# Patient Record
Sex: Female | Born: 1975
Health system: Southern US, Community
[De-identification: ages and names within clinical notes are randomized; demographics above are authoritative.]

## PROBLEM LIST (undated history)

## (undated) DIAGNOSIS — R011 Cardiac murmur, unspecified: Secondary | ICD-10-CM

## (undated) DIAGNOSIS — R55 Syncope and collapse: Secondary | ICD-10-CM

## (undated) DIAGNOSIS — K921 Melena: Secondary | ICD-10-CM

## (undated) DIAGNOSIS — I10 Essential (primary) hypertension: Secondary | ICD-10-CM

## (undated) HISTORY — DX: Cardiac murmur, unspecified: R01.1

## (undated) HISTORY — DX: Melena: K92.1

## (undated) HISTORY — DX: Essential (primary) hypertension: I10

## (undated) HISTORY — DX: Syncope and collapse: R55

---

## 2005-11-21 ENCOUNTER — Emergency Department: Payer: Self-pay | Admitting: Emergency Medicine

## 2009-10-19 ENCOUNTER — Encounter: Payer: Self-pay | Admitting: Family

## 2009-10-29 ENCOUNTER — Encounter: Payer: Self-pay | Admitting: Family

## 2010-08-07 ENCOUNTER — Emergency Department (HOSPITAL_BASED_OUTPATIENT_CLINIC_OR_DEPARTMENT_OTHER)
Admission: EM | Admit: 2010-08-07 | Discharge: 2010-08-08 | Disposition: A | Discharge status: Home / Self Care | Payer: Federal, State, Local not specified - PPO | Attending: Emergency Medicine | Admitting: Emergency Medicine

## 2010-08-07 DIAGNOSIS — I10 Essential (primary) hypertension: Secondary | ICD-10-CM | POA: Insufficient documentation

## 2010-08-07 LAB — BASIC METABOLIC PANEL
BUN: 14 mg/dL (ref 6–23)
CO2: 22 mEq/L (ref 19–32)
Glucose, Bld: 119 mg/dL — ABNORMAL HIGH (ref 70–99)
Potassium: 3.2 mEq/L — ABNORMAL LOW (ref 3.5–5.1)
Sodium: 141 mEq/L (ref 135–145)

## 2010-08-14 ENCOUNTER — Ambulatory Visit (INDEPENDENT_AMBULATORY_CARE_PROVIDER_SITE_OTHER): Payer: Federal, State, Local not specified - PPO | Admitting: Family

## 2010-08-14 ENCOUNTER — Encounter: Payer: Self-pay | Admitting: Family

## 2010-08-14 DIAGNOSIS — I10 Essential (primary) hypertension: Secondary | ICD-10-CM

## 2010-08-25 ENCOUNTER — Telehealth: Payer: Self-pay | Admitting: Family

## 2010-08-27 NOTE — Assessment & Plan Note (Signed)
Summary: seen in ED for elevated bp/bcbs/new to est/ss--rm 5   Vital Signs:  Patient profile:   35 year old female Menstrual status:  regular LMP:     08/11/2010 Height:      62 inches Weight:      146 pounds BMI:     26.80 Temp:     98.6 degrees F oral Pulse rate:   60 / minute Pulse rhythm:   regular Resp:     16 per minute BP sitting:   144 / 86  (right arm) Cuff size:   regular  Vitals Entered By: Mervin Kung CMA Duncan Dull) (August 14, 2010 1:53 PM) CC: Pt new to establish care. Follow up from ER for elevated BP, Hypertension Management Is Patient Diabetic? No Pain Assessment Patient in pain? no      LMP (date): 08/11/2010     Menstrual Status regular Enter LMP: 08/11/2010 Last PAP Result normal   CC:  Pt new to establish care. Follow up from ER for elevated BP and Hypertension Management.  History of Present Illness: Phyllis Hopkins is a 35 year old female who presents today to establish care.  Notes that she had an upper resp. infection 1 month ago and was told that her blood pressure was high. Reports that her BP was as high as 180/115.  Heart Murmur-  had ultrasound done in her 20's.  Has not had a PCP. Saw a cardiologist- forgot his name as well.  She was referred to her cardiologist by her GYN.  Sees Dr. Annett Fabian- [facility] in [place].  Blood in the stool-  pt reports that she had a colonoscopy performed in 2010 which showed "inflammation." Not sure where this was done.    Hypertension History:      She denies headache, chest pain, palpitations, dyspnea with exertion, peripheral edema, visual symptoms, syncope, and side effects from treatment.        Positive major cardiovascular risk factors include hypertension.  Negative major cardiovascular risk factors include female age less than 24 years old and non-tobacco-user status.     Preventive Screening-Counseling & Management  Alcohol-Tobacco     Alcohol drinks/day: <1     Smoking Status:  never  Caffeine-Diet-Exercise     Does Patient Exercise: yes     Times/week: 4  Allergies (verified): No Known Drug Allergies  Past History:  Past Medical History: Heart Murmur HTN Hx of blood in stool  Past Surgical History: none reported  Family History: Mother--living; HTN father--living, HTN, diabetes (diet controlled)  1 sister-- a&w no children  Social History: Reviewed history and no changes required. 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 Regular exercise-yes Smoking Status:  never Does Patient Exercise:  yes  Review of Systems       Constitutional: Denies Fever ENT:  Denies nasal congestion or sore throat. Resp: Denies cough CV:  Denies Chest Pain GI:  Denies nausea or vomitting GU: Denies dysuria Lymphatic: Denies lymphadenopathy Musculoskeletal:  some muscle pain left arm x 4 days Skin:  Denies Rashes Psychiatric: Denies depression or anxiety Neuro: Denies numbness     Physical Exam  General:  Well-developed,well-nourished,in no acute distress; alert,appropriate and cooperative throughout examination Head:  Normocephalic and atraumatic without obvious abnormalities. No apparent alopecia or balding. Neck:  No deformities, masses, or tenderness noted. Lungs:  Normal respiratory effort, chest expands symmetrically. Lungs are clear to auscultation, no crackles or wheezes. Heart:  Normal rate and regular rhythm. S1 and S2 normal without gallop, murmur, click, rub or other extra sounds. Abdomen:  Bowel sounds positive,abdomen soft and non-tender without masses, organomegaly or hernias noted. Psych:  Cognition and judgment appear intact. Alert and cooperative with normal attention span and concentration. No apparent delusions, illusions, hallucinations   Impression & Recommendations:  Problem # 1:  HYPERTENSION (ICD-401.9) Assessment New Patient's BP is  reasonable today, however she has not been using regular birth control and is on an ACE inhibitor.  Will plan to switch her to labetalol as this is a safer alternative if she were to become pregnant.  Records requested from her GYN. Her updated medication list for this problem includes:    Labetalol Hcl 100 Mg Tabs (Labetalol hcl) ..... One tab by mouth two times a day  BP today: 144/86  10 Yr Risk Heart Disease: Not enough information  Complete Medication List: 1)  Labetalol Hcl 100 Mg Tabs (Labetalol hcl) .... One tab by mouth two times a day  Hypertension Assessment/Plan:      The patient's hypertensive risk group is category A: No risk factors and no target organ damage.  Today's blood pressure is 144/86.  Her blood pressure goal is < 140/90.  Patient Instructions: 1)  Try to limit your sodium. 2)  Follow up in 2 weeks. 3)  Welcome to [facility]- we look forward to working with her. Prescriptions: LABETALOL HCL 100 MG TABS (LABETALOL HCL) one tab by mouth two times a day  #60 x 0   Entered and Authorized by:   Lemont Fillers FNP   Signed by:   Lemont Fillers FNP on 08/14/2010   Method used:   Electronically to        [id number] (retail)       [address] [place], [place]  [address]       Ph: [phone]       Fax: [phone]   RxID:   [id number]    Orders Added: 1)  New Patient Level III [44010]     Preventive Care Screening  Pap Smear:    Date:  10/19/2009    Results:  normal      Not sure when last tetanus shot was.    Current Allergies (reviewed today): No known allergies

## 2010-08-28 ENCOUNTER — Ambulatory Visit (INDEPENDENT_AMBULATORY_CARE_PROVIDER_SITE_OTHER): Payer: Federal, State, Local not specified - PPO | Admitting: Family

## 2010-08-28 ENCOUNTER — Encounter: Payer: Self-pay | Admitting: Family

## 2010-08-28 DIAGNOSIS — I10 Essential (primary) hypertension: Secondary | ICD-10-CM

## 2010-08-28 DIAGNOSIS — Z8719 Personal history of other diseases of the digestive system: Secondary | ICD-10-CM | POA: Insufficient documentation

## 2010-09-01 NOTE — Assessment & Plan Note (Signed)
Summary: 2 wk follow up/ss--rm 5   Vital Signs:  Patient profile:   35 year old female Menstrual status:  regular Height:      62 inches Weight:      147.50 pounds BMI:     27.08 Temp:     98.7 degrees F oral Pulse rate:   72 / minute Pulse rhythm:   regular Resp:     16 per minute BP sitting:   120 / 80  (right arm) Cuff size:   regular  Vitals Entered By: Mervin Kung CMA Duncan Dull) (August 28, 2010 10:38 AM) CC: Pt here for 2 week follow up. Is Patient Diabetic? No Pain Assessment Patient in pain? no      Comments Pt states readings at work have been 190/100. Phyllis Hopkins CMA Duncan Dull)  August 28, 2010 10:44 AM    CC:  Pt here for 2 week follow up.Marland Kitchen  History of Present Illness: [name] is a 35 year old female who presents today for follow up of her blood pressure.  HTN-  Last visit she was switched from ACE to labetalol.  She reports that she has been tolerating medication.  Does note that stool is soft- which is new for her, but she does not feel that this is a probleme.  She denies HA's. Denies swelling or shortness of breath.    Allergies (verified): No Known Drug Allergies  Past History:  Past Medical History: Last updated: 08/14/2010 Heart Murmur HTN Hx of blood in stool  Past Surgical History: Last updated: 08/14/2010 none reported  Review of Systems       see HPI  Physical Exam  General:  Well-developed,well-nourished,in no acute distress; alert,appropriate and cooperative throughout examination Head:  Normocephalic and atraumatic without obvious abnormalities. No apparent alopecia or balding. Lungs:  Normal respiratory effort, chest expands symmetrically. Lungs are clear to auscultation, no crackles or wheezes. Heart:  Normal rate and regular rhythm. S1 and S2 normal without gallop, murmur, click, rub or other extra sounds.   Impression & Recommendations:  Problem # 1:  HYPERTENSION (ICD-401.9) Assessment Improved  Reports that she has  had some very high readings at work.  I recommended that she purchase an automatic cuff and keep a journal of her BP's at home.  Call if BP consistently running greater than 140/90. Will continue current dose of labetalol today.  Plan f/u in 1 month for repeat BP check and CPX.  Pt was counseled on low sodium diet and exercise.   Her updated medication list for this problem includes:    Labetalol Hcl 100 Mg Tabs (Labetalol hcl) ..... One tab by mouth two times a day  BP today: 120/80 Prior BP: 144/86 (08/14/2010)  Prior 10 Yr Risk Heart Disease: Not enough information (08/14/2010)  Complete Medication List: 1)  Labetalol Hcl 100 Mg Tabs (Labetalol hcl) .... One tab by mouth two times a day  Patient Instructions: 1)  Please schedule a follow-up appointment in 1 month for a complete physical- come fasting to this appointment. Prescriptions: LABETALOL HCL 100 MG TABS (LABETALOL HCL) one tab by mouth two times a day  #60 x 2   Entered and Authorized by:   Lemont Fillers FNP   Signed by:   Lemont Fillers FNP on 08/28/2010   Method used:   Electronically to        [id number] (retail)       [address]  [place], [place]  [address]       Ph: [phone]       Fax: [phone]   RxID:   [id number]    Orders Added: 1)  Est. Patient Level III [40102]    Current Allergies (reviewed today): No known allergies

## 2010-09-01 NOTE — Miscellaneous (Signed)
  Clinical Lists Changes  Problems: Added new problem of COLITIS, HX OF (ICD-V12.79) - Per [facility] Dr. Kinnie Scales

## 2010-09-01 NOTE — Progress Notes (Signed)
Summary: records received  Phone Note Other Incoming   Summary of Call: Records received from [facility] and forwarded to Provider for review. Nicki Guadalajara Fergerson CMA Duncan Dull)  August 25, 2010 3:52 PM

## 2010-09-08 NOTE — Letter (Signed)
Summary: Records from [facility] 2006 - 2011  Records from [facility] 2006 - 2011   Imported By: Maryln Gottron 09/03/2010 15:51:50  _____________________________________________________________________  External Attachment:    Type:   Image     Comment:   External Document

## 2010-09-28 ENCOUNTER — Ambulatory Visit (INDEPENDENT_AMBULATORY_CARE_PROVIDER_SITE_OTHER): Payer: Federal, State, Local not specified - PPO | Admitting: Family

## 2010-09-28 ENCOUNTER — Encounter: Payer: Self-pay | Admitting: Family

## 2010-09-28 VITALS — BP 158/100 | HR 66 | Temp 98.7°F | Resp 18 | Ht 62.0 in | Wt 149.1 lb

## 2010-09-28 DIAGNOSIS — I10 Essential (primary) hypertension: Secondary | ICD-10-CM

## 2010-09-28 MED ORDER — LABETALOL HCL 100 MG PO TABS: 100.0000 mg | ORAL_TABLET | Freq: Two times a day (BID) | ORAL | Status: DC

## 2010-09-28 MED ORDER — HYDROCHLOROTHIAZIDE 25 MG PO TABS: 25.0000 mg | ORAL_TABLET | Freq: Every day | ORAL | Status: DC

## 2010-09-28 NOTE — Progress Notes (Signed)
  Subjective:    Patient ID: Phyllis Hopkins, female    DOB: 28-May-1976, 35 y.o.   MRN: [id number]  HPI  Ms.  Phyllis Hopkins is a 35 yr old female who present today for follow up of her hypertension.  She was last seen on 3/9 and was started on labetalol 100mg  BID. She has had trouble remembering the second dose 2-3 x a week.  She has had some associated dizziness.  Has had some flushing/light headedness, but no loss of consciousness.     Review of Systems  Constitutional: Negative for fever and chills.  HENT: Negative for hearing loss.   Eyes: Negative for pain.  Respiratory: Negative for shortness of breath.   Cardiovascular: Negative for chest pain and palpitations.  Neurological: Negative for tremors and weakness.   No past medical history on file.  History   Social History  . Marital Status: Married    Spouse Name: N/A    Number of Children: N/A  . Years of Education: N/A   Occupational History  . Not on file.   Social History Main Topics  . Smoking status: Never Smoker   . Smokeless tobacco: Never Used  . Alcohol Use: Not on file  . Drug Use: Not on file  . Sexually Active: Not on file   Other Topics Concern  . Not on file   Social History Narrative  . No narrative on file    No past surgical history on file.  No family history on file.  No Known Allergies  No current outpatient prescriptions on file prior to visit.    BP 158/100  Pulse 66  Temp(Src) 98.7 F (37.1 C) (Oral)  Resp 18  Ht 5\' 2"  (1.575 m)  Wt 149 lb 1.3 oz (67.622 kg)  BMI 27.27 kg/m2       Objective:   Physical Exam  Constitutional: She appears well-developed and well-nourished.  Cardiovascular: Normal rate and regular rhythm.   Pulmonary/Chest: Effort normal and breath sounds normal.  Musculoskeletal: She exhibits no edema.          Assessment & Plan:

## 2010-09-30 ENCOUNTER — Telehealth: Payer: Self-pay | Admitting: Family

## 2010-09-30 DIAGNOSIS — I10 Essential (primary) hypertension: Secondary | ICD-10-CM | POA: Insufficient documentation

## 2010-09-30 NOTE — Telephone Encounter (Signed)
Med was refilled electronically on 09/28/10 #60 x 2 refills. Left voice message on pharmacy voicemail.

## 2010-09-30 NOTE — Telephone Encounter (Signed)
Refill- labetalol 100mg  tablets. Take 1 tablet by mouth twice daily. Qty 60. Last fill 3.28.12

## 2010-09-30 NOTE — Assessment & Plan Note (Signed)
Pt is still considering pregnancy.  She does not feel that the side effects of the labetalol are enough to stop the medication.  BP still not at goal.  Add HCTZ.  F/u in 1 month.

## 2011-01-04 ENCOUNTER — Other Ambulatory Visit: Payer: Self-pay | Admitting: Family

## 2011-01-05 ENCOUNTER — Encounter: Payer: Self-pay | Admitting: Family

## 2011-01-06 ENCOUNTER — Encounter: Payer: Self-pay | Admitting: Family

## 2011-01-06 ENCOUNTER — Ambulatory Visit (INDEPENDENT_AMBULATORY_CARE_PROVIDER_SITE_OTHER): Payer: Federal, State, Local not specified - PPO | Admitting: Family

## 2011-01-06 VITALS — BP 146/94 | HR 78 | Temp 97.8°F | Resp 16 | Ht 62.0 in | Wt 145.1 lb

## 2011-01-06 DIAGNOSIS — I10 Essential (primary) hypertension: Secondary | ICD-10-CM

## 2011-01-06 MED ORDER — LABETALOL HCL 200 MG PO TABS: 200.0000 mg | ORAL_TABLET | Freq: Two times a day (BID) | ORAL | Status: DC

## 2011-01-06 NOTE — Progress Notes (Signed)
  Subjective:    Patient ID: Phyllis Hopkins, female    DOB: 1976-01-04, 35 y.o.   MRN: [id number]  HPI  Patient presents today for followup of hypertension.  Patient has been treated for Chronic HTN for quite sometime. She is currently on labetalol 100mg  TID- though she ran out several days ago.  She never started the HCTZ as recommended last visit. She reports that she didn't want to have the urinary frequency associated with it.  No associated S/S related to HTN  Quality: chronic Modifying factor: meds Duration: Quite sometime Associated S/S: None.  The patient denies the following associated symptoms: Chest pain, dyspnea, blurred vision, headache, or lower extremity edema.    Review of Systems See HPI  Past Medical History  Diagnosis Date  . Heart murmur   . Hypertension   . Blood in stool     hx of    History   Social History  . Marital Status: Married    Spouse Name: N/A    Number of Children: N/A  . Years of Education: N/A   Occupational History  . respiratory therapistory therapist     [employer]   Social History Main Topics  . Smoking status: Never Smoker   . Smokeless tobacco: Never Used  . Alcohol Use: Yes     once every other month  . Drug Use: Not on file  . Sexually Active: Not on file   Other Topics Concern  . Not on file   Social History Narrative   Divorced lives w/ cousinRegular exercise; yes    No past surgical history on file.  Family History  Problem Relation Age of Onset  . Hypertension Mother   . Hypertension Father   . Diabetes Father     diet controlled    No Known Allergies  Current Outpatient Prescriptions on File Prior to Visit  Medication Sig Dispense Refill  . labetalol (NORMODYNE) 100 MG tablet Take 1 tablet (100 mg total) by mouth 2 (two) times daily.  60 tablet  2  . hydrochlorothiazide 25 MG tablet Take 1 tablet (25 mg total) by mouth daily.  30 tablet  2    BP 146/94  Pulse 78  Temp(Src) 97.8 F (36.6 C)  (Oral)  Resp 16  Ht 5\' 2"  (1.575 m)  Wt 145 lb 1.3 oz (65.808 kg)  BMI 26.54 kg/m2  LMP 01/03/2011       Objective:   Physical Exam  Constitutional: She appears well-developed and well-nourished.  Cardiovascular: Normal rate and regular rhythm.   Pulmonary/Chest: Effort normal and breath sounds normal.  Musculoskeletal: She exhibits no edema.  Psychiatric: She has a normal mood and affect. Her behavior is normal. Judgment and thought content normal.          Assessment & Plan:

## 2011-01-06 NOTE — Assessment & Plan Note (Signed)
BP Readings from Last 3 Encounters:  01/06/11 146/94  09/28/10 158/100  BP is improved, but not at goal.  Will increase labetalol to 200mg  BID.  She denies significant drowsiness at this point. F/u in 1 month.  Compliance was reinforced.

## 2011-01-06 NOTE — Patient Instructions (Signed)
Follow up in 1 month, sooner if problems or concerns.

## 2011-02-01 ENCOUNTER — Ambulatory Visit (INDEPENDENT_AMBULATORY_CARE_PROVIDER_SITE_OTHER): Payer: Federal, State, Local not specified - PPO | Admitting: Family

## 2011-02-01 VITALS — BP 150/104 | HR 60 | Temp 98.4°F | Ht 62.0 in | Wt 145.1 lb

## 2011-02-01 DIAGNOSIS — I10 Essential (primary) hypertension: Secondary | ICD-10-CM

## 2011-02-01 MED ORDER — AMLODIPINE BESYLATE 5 MG PO TABS: 5.0000 mg | ORAL_TABLET | Freq: Every day | ORAL | Status: DC

## 2011-02-01 NOTE — Progress Notes (Signed)
  Subjective:    Patient ID: Phyllis Hopkins, female    DOB: [birth year], 35 y.o.   MRN: [id number]  HPI  Patient presents today for followup of hypertension.  Patient has been treated for Chronic HTN for quiet sometime. She is currently on labetalol.and poorly controlled. Has trouble remembering the evening dose.  No associated S/S related to HTN.   Quality: chronic Modifying factor: meds Duration: Quite sometime Associated S/S: None.  The patient denies the following associated symptoms: Chest pain, dyspnea, blurred vision, headache, or lower extremity edema.     Review of Systems See HPI  Past Medical History  Diagnosis Date  . Heart murmur   . Hypertension   . Blood in stool     hx of    History   Social History  . Marital Status: Married    Spouse Name: N/A    Number of Children: N/A  . Years of Education: N/A   Occupational History  . respiratory therapistory therapist     [employer]   Social History Main Topics  . Smoking status: Never Smoker   . Smokeless tobacco: Never Used  . Alcohol Use: Yes     once every other month  . Drug Use: Not on file  . Sexually Active: Not on file   Other Topics Concern  . Not on file   Social History Narrative   Divorced lives w/ cousinRegular exercise; yes    No past surgical history on file.  Family History  Problem Relation Age of Onset  . Hypertension Mother   . Hypertension Father   . Diabetes Father     diet controlled    No Known Allergies  Current Outpatient Prescriptions on File Prior to Visit  Medication Sig Dispense Refill  . labetalol (NORMODYNE) 200 MG tablet Take 1 tablet (200 mg total) by mouth 2 (two) times daily.  60 tablet  2    BP 150/104  Pulse 60  Temp(Src) 98.4 F (36.9 C) (Oral)  Ht 5\' 2"  (1.575 m)  Wt 145 lb 1.9 oz (65.826 kg)  BMI 26.54 kg/m2  LMP 01/31/2011       Objective:   Physical Exam  Constitutional: She appears well-developed and well-nourished.  HENT:    Head: Normocephalic and atraumatic.  Cardiovascular: Normal rate and regular rhythm.   Pulmonary/Chest: Effort normal and breath sounds normal. No respiratory distress. She has no wheezes. She has no rales. She exhibits no tenderness.  Musculoskeletal: She exhibits no edema.  Psychiatric: She has a normal mood and affect. Her behavior is normal. Judgment and thought content normal.          Assessment & Plan:

## 2011-02-01 NOTE — Patient Instructions (Signed)
Please follow up in 1 month. Sooner if problems or concerns.  

## 2011-02-01 NOTE — Assessment & Plan Note (Signed)
BP Readings from Last 3 Encounters:  02/01/11 150/104  01/06/11 146/94  09/28/10 158/100   No improvement.  Compliance reinforced.  Add norvasc.  She reports that she is not sexually active.  We discussed that norvasc is a category C and that she should not become pregnant on this medication.   She  verbalizes understanding.

## 2011-03-09 ENCOUNTER — Encounter: Payer: Self-pay | Admitting: Family

## 2011-03-09 ENCOUNTER — Ambulatory Visit (INDEPENDENT_AMBULATORY_CARE_PROVIDER_SITE_OTHER): Payer: Federal, State, Local not specified - PPO | Admitting: Family

## 2011-03-09 DIAGNOSIS — I10 Essential (primary) hypertension: Secondary | ICD-10-CM

## 2011-03-09 NOTE — Progress Notes (Signed)
  Subjective:    Patient ID: Phyllis Hopkins, female    DOB: 02-23-1976, 35 y.o.   MRN: [id number]  HPI  Ms.  Phyllis Hopkins is a 35 yr old female who presents today for follow up of her HTN.  She reports + med compliance.  She has started setting an alarm on her phone to help her remember the HS dose of labetalol.  She denies edema, chest pain, headache, or blurred vision.   She brings with her today her records from her home BP readings which range 116-150/62-86.    Review of Systems See HPI  Past Medical History  Diagnosis Date  . Heart murmur   . Hypertension   . Blood in stool     hx of    History   Social History  . Marital Status: Married    Spouse Name: N/A    Number of Children: N/A  . Years of Education: N/A   Occupational History  . respiratory therapistory therapist     [employer]   Social History Main Topics  . Smoking status: Never Smoker   . Smokeless tobacco: Never Used  . Alcohol Use: Yes     once every other month  . Drug Use: Not on file  . Sexually Active: Not on file   Other Topics Concern  . Not on file   Social History Narrative   Divorced lives w/ cousinRegular exercise; yes    No past surgical history on file.  Family History  Problem Relation Age of Onset  . Hypertension Mother   . Hypertension Father   . Diabetes Father     diet controlled    No Known Allergies  Current Outpatient Prescriptions on File Prior to Visit  Medication Sig Dispense Refill  . amLODipine (NORVASC) 5 MG tablet Take 1 tablet (5 mg total) by mouth daily.  30 tablet  1  . labetalol (NORMODYNE) 200 MG tablet Take 1 tablet (200 mg total) by mouth 2 (two) times daily.  60 tablet  2    BP 122/90  Pulse 72  Temp(Src) 97.6 F (36.4 C) (Oral)  Resp 16  Wt 146 lb (66.225 kg)       Objective:   Physical Exam  Constitutional: She is oriented to person, place, and time. She appears well-developed and well-nourished.  HENT:  Head: Normocephalic and  atraumatic.  Cardiovascular: Normal rate and regular rhythm.   No murmur heard. Pulmonary/Chest: Effort normal and breath sounds normal. No respiratory distress. She has no wheezes. She has no rales. She exhibits no tenderness.  Musculoskeletal: She exhibits no edema.  Neurological: She is oriented to person, place, and time.  Skin: Skin is warm and dry.  Psychiatric: She has a normal mood and affect. Her behavior is normal. Judgment and thought content normal.          Assessment & Plan:

## 2011-03-09 NOTE — Assessment & Plan Note (Signed)
BP is improved.  Continue current medications. Plan f/u in 3 months.

## 2011-04-06 ENCOUNTER — Other Ambulatory Visit: Payer: Self-pay | Admitting: Family

## 2011-04-06 NOTE — Telephone Encounter (Signed)
Amlodipine #30 x 2 refills sent to pharmacy. Pt is due for follow up in December.

## 2011-04-19 ENCOUNTER — Other Ambulatory Visit: Payer: Self-pay | Admitting: Family

## 2011-05-07 ENCOUNTER — Other Ambulatory Visit: Payer: Self-pay | Admitting: *Deleted

## 2011-05-07 MED ORDER — LABETALOL HCL 200 MG PO TABS: 200.0000 mg | ORAL_TABLET | Freq: Two times a day (BID) | ORAL | Status: DC

## 2011-05-07 NOTE — Telephone Encounter (Signed)
Pt called requesting refill of labetalol 200mg . Refill sent to pharmacy with note to schedule follow up in December.

## 2011-07-14 ENCOUNTER — Encounter: Payer: Self-pay | Admitting: Family

## 2011-07-14 ENCOUNTER — Ambulatory Visit (INDEPENDENT_AMBULATORY_CARE_PROVIDER_SITE_OTHER): Payer: Federal, State, Local not specified - PPO | Admitting: Family

## 2011-07-14 DIAGNOSIS — I10 Essential (primary) hypertension: Secondary | ICD-10-CM

## 2011-07-14 MED ORDER — AMLODIPINE BESYLATE 5 MG PO TABS: 5.0000 mg | ORAL_TABLET | Freq: Every day | ORAL | Status: DC

## 2011-07-14 MED ORDER — LABETALOL HCL 200 MG PO TABS: 200.0000 mg | ORAL_TABLET | Freq: Two times a day (BID) | ORAL | Status: DC

## 2011-07-14 NOTE — Patient Instructions (Signed)
Please schedule a fasting physical in 3 months.  

## 2011-07-14 NOTE — Progress Notes (Signed)
  Subjective:    Patient ID: Phyllis Hopkins, female    DOB: [birth year], 36 y.o.   MRN: [id number]  HPI  Patient presents today for followup of hypertension.  Patient has been treated for Chronic HTN for quiet sometime. She is currently on amlodipine and labetalol, and well controlled. No associated S/S related to HTN.   Quality: chronic Modifying factor: meds Duration: Quite sometime Associated S/S: None.  The patient denies the following associated symptoms: Chest pain, dyspnea, blurred vision, headache, or lower extremity edema.    Review of Systems See HPI  Past Medical History  Diagnosis Date  . Heart murmur   . Hypertension   . Blood in stool     hx of    History   Social History  . Marital Status: Married    Spouse Name: N/A    Number of Children: N/A  . Years of Education: N/A   Occupational History  . respiratory therapistory therapist     [employer]   Social History Main Topics  . Smoking status: Never Smoker   . Smokeless tobacco: Never Used  . Alcohol Use: Yes     once every other month  . Drug Use: Not on file  . Sexually Active: Not on file   Other Topics Concern  . Not on file   Social History Narrative   Divorced lives w/ cousinRegular exercise; yes    No past surgical history on file.  Family History  Problem Relation Age of Onset  . Hypertension Mother   . Hypertension Father   . Diabetes Father     diet controlled    No Known Allergies  Current Outpatient Prescriptions on File Prior to Visit  Medication Sig Dispense Refill  . amLODipine (NORVASC) 5 MG tablet TAKE 1 TABLET BY MOUTH EVERY DAY  30 tablet  2  . labetalol (NORMODYNE) 200 MG tablet Take 1 tablet (200 mg total) by mouth 2 (two) times daily.  60 tablet  1    BP 112/70  Pulse 67  Temp(Src) 97.9 F (36.6 C) (Oral)  Resp 16  Wt 149 lb 1.3 oz (67.622 kg)  SpO2 99%  LMP 06/16/2011       Objective:   Physical Exam  Constitutional: She appears  well-developed and well-nourished. No distress.  Cardiovascular: Normal rate and regular rhythm.   No murmur heard. Pulmonary/Chest: Effort normal and breath sounds normal. No respiratory distress. She has no wheezes. She has no rales.  Musculoskeletal: She exhibits no edema.  Skin: Skin is warm and dry.  Psychiatric: She has a normal mood and affect. Her behavior is normal. Judgment and thought content normal.          Assessment & Plan:

## 2011-07-14 NOTE — Assessment & Plan Note (Signed)
Blood pressure looks great today, continue current dosing of amlodipine and labetalol.

## 2012-02-08 ENCOUNTER — Other Ambulatory Visit: Payer: Self-pay | Admitting: Family

## 2012-03-18 ENCOUNTER — Other Ambulatory Visit: Payer: Self-pay | Admitting: Family

## 2012-06-19 ENCOUNTER — Telehealth: Payer: Self-pay | Admitting: Family

## 2012-06-19 MED ORDER — LABETALOL HCL 200 MG PO TABS: 200.0000 mg | ORAL_TABLET | Freq: Two times a day (BID) | ORAL | Status: DC

## 2012-06-19 NOTE — Telephone Encounter (Signed)
Refill sent, #180 x no refills. Pt is past due for fasting physical. Pt was last seen in January and recommended CPE in 3 months.  Please call pt to arrange appt as we cannot send further refills until she is seen.

## 2012-06-19 NOTE — Telephone Encounter (Signed)
Phone # is incorrect .

## 2012-06-19 NOTE — Telephone Encounter (Signed)
Letter mailed to pt. Spoke with [place] at [facility] and changed quantity to #60 as pt is past due for follow up.

## 2012-06-19 NOTE — Telephone Encounter (Signed)
Refill- labetalol 200mg  tablets. Take one tablet by mouth twice daily. Qty 180 last fill 9.30.13

## 2012-08-02 ENCOUNTER — Ambulatory Visit (INDEPENDENT_AMBULATORY_CARE_PROVIDER_SITE_OTHER): Payer: Federal, State, Local not specified - PPO | Admitting: Family

## 2012-08-02 ENCOUNTER — Encounter: Payer: Self-pay | Admitting: Family

## 2012-08-02 VITALS — BP 110/78 | HR 81 | Temp 98.1°F | Resp 16 | Wt 139.0 lb

## 2012-08-02 DIAGNOSIS — H109 Unspecified conjunctivitis: Secondary | ICD-10-CM

## 2012-08-02 MED ORDER — NEOMYCIN-POLYMYXIN-DEXAMETH 0.1 % OP SUSP: 1.0000 [drp] | Freq: Four times a day (QID) | OPHTHALMIC | Status: DC

## 2012-08-02 MED ORDER — AMOXICILLIN-POT CLAVULANATE 875-125 MG PO TABS: 1.0000 | ORAL_TABLET | Freq: Two times a day (BID) | ORAL | Status: DC

## 2012-08-02 NOTE — Progress Notes (Signed)
  Subjective:    Patient ID: Phyllis Hopkins, female    DOB: 1976-03-21, 37 y.o.   MRN: [id number]  HPI  R eye redness- started yesterday as yellow pus, then started getting red this morning. She reports associated sinusitis the last couple of days.  + sore throat.  Denies changes in vision in the right eye.   Review of Systems See HPI  Past Medical History  Diagnosis Date  . Heart murmur   . Hypertension   . Blood in stool     hx of    History   Social History  . Marital Status: Married    Spouse Name: N/A    Number of Children: N/A  . Years of Education: N/A   Occupational History  . respiratory therapistory therapist     [employer]   Social History Main Topics  . Smoking status: Never Smoker   . Smokeless tobacco: Never Used  . Alcohol Use: Yes     Comment: once every other month  . Drug Use: Not on file  . Sexually Active: Not on file   Other Topics Concern  . Not on file   Social History Narrative   Divorced lives w/ cousin   Regular exercise; yes    No past surgical history on file.  Family History  Problem Relation Age of Onset  . Hypertension Mother   . Hypertension Father   . Diabetes Father     diet controlled    No Known Allergies  Current Outpatient Prescriptions on File Prior to Visit  Medication Sig Dispense Refill  . amLODipine (NORVASC) 5 MG tablet TAKE 1 TABLET BY MOUTH EVERY DAY  90 tablet  1  . labetalol (NORMODYNE) 200 MG tablet Take 1 tablet (200 mg total) by mouth 2 (two) times daily.  60 tablet  0   No current facility-administered medications on file prior to visit.    BP 110/78  Pulse 81  Temp(Src) 98.1 F (36.7 C) (Oral)  Resp 16  Wt 139 lb (63.05 kg)  BMI 25.42 kg/m2  SpO2 99%       Objective:   Physical Exam  Constitutional: She is oriented to person, place, and time. She appears well-developed and well-nourished. No distress.  HENT:  Head: Normocephalic and atraumatic.  Mouth/Throat: No oropharyngeal  exudate, posterior oropharyngeal edema or posterior oropharyngeal erythema.  Eyes: Right conjunctiva is injected. Right conjunctiva has no hemorrhage. Left conjunctiva is not injected. Left conjunctiva has no hemorrhage. No scleral icterus.  Mild swelling of upper and lower lid  Cardiovascular: Normal rate and regular rhythm.   No murmur heard. Pulmonary/Chest: Effort normal and breath sounds normal. No respiratory distress. She has no wheezes. She has no rales. She exhibits no tenderness.  Lymphadenopathy:    She has no cervical adenopathy.  Neurological: She is alert and oriented to person, place, and time.  Psychiatric: She has a normal mood and affect. Her behavior is normal. Judgment and thought content normal.          Assessment & Plan:

## 2012-08-02 NOTE — Patient Instructions (Addendum)
Call if symptoms worsen, or if not improved in 48 hours.

## 2012-08-02 NOTE — Assessment & Plan Note (Signed)
Will rx with maxitrol drops, add augmentin to cover sinus.

## 2012-08-22 ENCOUNTER — Telehealth: Payer: Self-pay | Admitting: Family

## 2012-08-22 MED ORDER — AMLODIPINE BESYLATE 5 MG PO TABS: ORAL_TABLET | ORAL | Status: DC

## 2012-08-22 NOTE — Telephone Encounter (Signed)
Refill sent, #90 x 1 refill.

## 2012-08-22 NOTE — Telephone Encounter (Signed)
Refill- amlodipine besylate 5mg  tablets. Take one tablet by mouth every day. Qty 90 last fill 11.18.13

## 2012-10-30 ENCOUNTER — Telehealth: Payer: Self-pay | Admitting: Family

## 2012-10-30 MED ORDER — LABETALOL HCL 200 MG PO TABS: 200.0000 mg | ORAL_TABLET | Freq: Two times a day (BID) | ORAL | Status: DC

## 2012-10-30 NOTE — Telephone Encounter (Signed)
Refill- labetalol 200mg  tablets. Take one tablet by mouth twice daily. Qty 60 last fill 12.30.13

## 2012-10-30 NOTE — Telephone Encounter (Signed)
Refill sent.

## 2013-02-23 ENCOUNTER — Other Ambulatory Visit: Payer: Self-pay | Admitting: Family

## 2013-02-23 NOTE — Telephone Encounter (Signed)
Amlodipine refill sent to pharmacy, #90 x no refills. Pt is past due for follow up of blood pressure. Please call pt to arrange appt as we will not be able to provide further refills until she is seen.

## 2013-02-24 NOTE — Telephone Encounter (Signed)
Left message for patient to return my call.

## 2013-02-27 NOTE — Telephone Encounter (Signed)
Left message for patient to return my call.

## 2013-02-28 NOTE — Telephone Encounter (Signed)
Informed patient of medication refill and that she needs to be seen before further refills can be given. Patient states that she will have to call back when she knows her schedule.

## 2013-05-23 ENCOUNTER — Ambulatory Visit (INDEPENDENT_AMBULATORY_CARE_PROVIDER_SITE_OTHER): Payer: Federal, State, Local not specified - PPO | Admitting: Family

## 2013-05-23 ENCOUNTER — Encounter: Payer: Self-pay | Admitting: Family

## 2013-05-23 VITALS — BP 130/80 | HR 55 | Temp 98.1°F | Resp 16 | Ht 62.0 in | Wt 148.1 lb

## 2013-05-23 DIAGNOSIS — I10 Essential (primary) hypertension: Secondary | ICD-10-CM

## 2013-05-23 LAB — BASIC METABOLIC PANEL
CO2: 26 mEq/L (ref 19–32)
Calcium: 9.5 mg/dL (ref 8.4–10.5)
Sodium: 138 mEq/L (ref 135–145)

## 2013-05-23 MED ORDER — LABETALOL HCL 200 MG PO TABS: 200.0000 mg | ORAL_TABLET | Freq: Two times a day (BID) | ORAL | Status: DC

## 2013-05-23 MED ORDER — AMLODIPINE BESYLATE 5 MG PO TABS: ORAL_TABLET | ORAL | Status: DC

## 2013-05-23 NOTE — Assessment & Plan Note (Signed)
BP stable on current meds.  Continue same, obtain bmet. Reminded pt to continue HS dose labetalol.

## 2013-05-23 NOTE — Patient Instructions (Signed)
Please follow up in 6 months.

## 2013-05-23 NOTE — Progress Notes (Signed)
   Subjective:    Patient ID: Phyllis Hopkins, female    DOB: February 09, 1976, 37 y.o.   MRN: [id number]  HPI  Phyllis Hopkins is a 37 yr old female who presents today for follow up of blood pressure.  HTN- reports that she is continued on amlodipine, labatalol.  Sometimes forgets PM dose of labetalol.  Denies CP, SOB or LE edema.   Review of Systems See HPI  Past Medical History  Diagnosis Date  . Heart murmur   . Hypertension   . Blood in stool     hx of    History   Social History  . Marital Status: Married    Spouse Name: N/A    Number of Children: N/A  . Years of Education: N/A   Occupational History  . respiratory therapistory therapist     [employer]   Social History Main Topics  . Smoking status: Never Smoker   . Smokeless tobacco: Never Used  . Alcohol Use: Yes     Comment: once every other month  . Drug Use: Not on file  . Sexual Activity: Not on file   Other Topics Concern  . Not on file   Social History Narrative   Divorced lives w/ cousin   Regular exercise; yes    No past surgical history on file.  Family History  Problem Relation Age of Onset  . Hypertension Mother   . Hypertension Father   . Diabetes Father     diet controlled    No Known Allergies  Current Outpatient Prescriptions on File Prior to Visit  Medication Sig Dispense Refill  . amLODipine (NORVASC) 5 MG tablet TAKE 1 TABLET BY MOUTH EVERY DAY  90 tablet  0  . labetalol (NORMODYNE) 200 MG tablet Take 1 tablet (200 mg total) by mouth 2 (two) times daily.  60 tablet  2   No current facility-administered medications on file prior to visit.    BP 130/80  Pulse 55  Temp(Src) 98.1 F (36.7 C) (Oral)  Resp 16  Ht 5\' 2"  (1.575 m)  Wt 148 lb 1.9 oz (67.187 kg)  BMI 27.08 kg/m2  SpO2 99%  LMP 05/14/2013       Objective:   Physical Exam  Constitutional: She appears well-developed and well-nourished. No distress.  HENT:  Head: Normocephalic and atraumatic.    Cardiovascular: Normal rate and regular rhythm.   No murmur heard. Pulmonary/Chest: Effort normal and breath sounds normal. No respiratory distress. She has no wheezes. She has no rales. She exhibits no tenderness.  Musculoskeletal: She exhibits no edema.  Neurological: She is alert.  Skin: Skin is warm and dry.  Psychiatric: She has a normal mood and affect. Her behavior is normal. Judgment and thought content normal.          Assessment & Plan:

## 2013-05-24 ENCOUNTER — Encounter: Payer: Self-pay | Admitting: Family

## 2013-11-20 ENCOUNTER — Telehealth: Payer: Self-pay | Admitting: Family

## 2013-11-20 ENCOUNTER — Ambulatory Visit: Payer: Self-pay | Admitting: Family

## 2013-11-20 DIAGNOSIS — Z0289 Encounter for other administrative examinations: Secondary | ICD-10-CM

## 2013-11-20 NOTE — Telephone Encounter (Signed)
Patient was scheduled for a 6 month follow up today and did not come

## 2013-11-20 NOTE — Telephone Encounter (Signed)
Please contact pt to reschedule appointment.  

## 2013-11-21 ENCOUNTER — Encounter: Payer: Self-pay | Admitting: Family

## 2013-11-21 NOTE — Telephone Encounter (Signed)
Appointment rescheduled for 11/23/13

## 2013-11-23 ENCOUNTER — Ambulatory Visit: Payer: Self-pay | Admitting: Family

## 2013-11-23 DIAGNOSIS — Z0289 Encounter for other administrative examinations: Secondary | ICD-10-CM

## 2013-11-27 ENCOUNTER — Ambulatory Visit (INDEPENDENT_AMBULATORY_CARE_PROVIDER_SITE_OTHER): Payer: Federal, State, Local not specified - PPO | Admitting: Family

## 2013-11-27 ENCOUNTER — Encounter: Payer: Self-pay | Admitting: Family

## 2013-11-27 VITALS — BP 136/86 | HR 81 | Temp 98.5°F | Resp 16 | Ht 62.0 in | Wt 148.1 lb

## 2013-11-27 DIAGNOSIS — I10 Essential (primary) hypertension: Secondary | ICD-10-CM

## 2013-11-27 LAB — BASIC METABOLIC PANEL
BUN: 10 mg/dL (ref 6–23)
CO2: 25 meq/L (ref 19–32)
Calcium: 9 mg/dL (ref 8.4–10.5)
Chloride: 106 mEq/L (ref 96–112)
Creat: 0.72 mg/dL (ref 0.50–1.10)
Glucose, Bld: 88 mg/dL (ref 70–99)
Potassium: 4 mEq/L (ref 3.5–5.3)
SODIUM: 137 meq/L (ref 135–145)

## 2013-11-27 NOTE — Progress Notes (Signed)
   Subjective:    Patient ID: Phyllis Hopkins, female    DOB: 14-Aug-1975, 38 y.o.   MRN: [id number]  HPI  [name] is a 38 yr old female who presents today for follow up of hypertension.She is maintained on amlodipine and labetalol. Denies CP/SOB or swelling.  Denies side effects.   BP Readings from Last 3 Encounters:  11/27/13 136/86  05/23/13 130/80  08/02/12 110/78      Review of Systems See HPI Past Medical History  Diagnosis Date  . Heart murmur   . Hypertension   . Blood in stool     hx of    History   Social History  . Marital Status: Married    Spouse Name: N/A    Number of Children: N/A  . Years of Education: N/A   Occupational History  . respiratory therapistory therapist     [employer]   Social History Main Topics  . Smoking status: Never Smoker   . Smokeless tobacco: Never Used  . Alcohol Use: Yes     Comment: once every other month  . Drug Use: Not on file  . Sexual Activity: Not on file   Other Topics Concern  . Not on file   Social History Narrative   Divorced lives w/ cousin   Regular exercise; yes    No past surgical history on file.  Family History  Problem Relation Age of Onset  . Hypertension Mother   . Hypertension Father   . Diabetes Father     diet controlled    No Known Allergies  Current Outpatient Prescriptions on File Prior to Visit  Medication Sig Dispense Refill  . amLODipine (NORVASC) 5 MG tablet TAKE 1 TABLET BY MOUTH EVERY DAY  90 tablet  1  . labetalol (NORMODYNE) 200 MG tablet Take 1 tablet (200 mg total) by mouth 2 (two) times daily.  180 tablet  1   No current facility-administered medications on file prior to visit.    BP 136/86  Pulse 81  Temp(Src) 98.5 F (36.9 C) (Oral)  Resp 16  Ht 5\' 2"  (1.575 m)  Wt 148 lb 1.9 oz (67.187 kg)  BMI 27.08 kg/m2  SpO2 99%  LMP 11/14/2013       Objective:   Physical Exam  Constitutional: She is oriented to person, place, and time. She appears  well-developed and well-nourished. No distress.  Cardiovascular: Normal rate and regular rhythm.   No murmur heard. Pulmonary/Chest: Effort normal and breath sounds normal. No respiratory distress. She has no wheezes. She has no rales. She exhibits no tenderness.  Musculoskeletal: She exhibits no edema.  Neurological: She is alert and oriented to person, place, and time. s Psychiatric: She has a normal mood and affect. Her behavior is normal. Judgment and thought content normal.          Assessment & Plan:

## 2013-11-27 NOTE — Patient Instructions (Signed)
Please schedule fasting physical in the next 3 months.

## 2013-11-27 NOTE — Progress Notes (Signed)
Pre visit review using our clinic review tool, if applicable. No additional management support is needed unless otherwise documented below in the visit note. 

## 2013-11-27 NOTE — Assessment & Plan Note (Signed)
BP stable. Continue current meds.  Obtain bmet. 

## 2013-11-28 ENCOUNTER — Encounter: Payer: Self-pay | Admitting: Family

## 2013-11-29 ENCOUNTER — Ambulatory Visit (INDEPENDENT_AMBULATORY_CARE_PROVIDER_SITE_OTHER): Payer: BC Managed Care – PPO | Admitting: Emergency Medicine

## 2013-11-29 VITALS — BP 146/90 | HR 75 | Temp 98.7°F | Resp 16 | Ht 62.0 in | Wt 149.0 lb

## 2013-11-29 DIAGNOSIS — H16009 Unspecified corneal ulcer, unspecified eye: Secondary | ICD-10-CM

## 2013-11-29 MED ORDER — TOBRAMYCIN 0.3 % OP SOLN: 1.0000 [drp] | OPHTHALMIC | Status: DC

## 2013-11-29 MED ORDER — ACETAMINOPHEN-CODEINE #3 300-30 MG PO TABS: 1.0000 | ORAL_TABLET | ORAL | Status: DC | PRN

## 2013-11-29 NOTE — Patient Instructions (Signed)
Corneal Ulcer A corneal ulcer is an open sore on the cornea. The cornea is the clear covering at the front and center of the eye.  CAUSES  Most corneal ulcers are caused by infection, but there are other causes as well.  Bacterial infection. A bacterial infection can occur and cause a corneal ulcer if:  Contact lenses are worn too long (especially overnight) or are not properly cared for.  An eye injury occurs, allowing bacteria to infect the area of injury.  Viral infection. A viral infection can occur and cause a corneal ulcer if:  The eye becomes infected with a virus, such as the herpes simplex (cold sore) virus, chickenpox virus, or shingles virus.  Fungal infection. A fungal infection can occur and cause a corneal ulcer if:  An eye injury resulted from contact with a plant or plant material.  An anti-inflammatory eye drop is overused.  You have a weakened immune system.  Contact lenses are improperly cared for or become infected.  Foreign bodies in the eye, such as sand, glass, or small pieces of glass or metal.  Dry eyes.  Certain disorders that prevent eyelids from closing completely, such as Bell's palsy.  Contact lenses, especially extended-wear soft contact lenses. Contact lenses can:  Scratch the cornea's surface, allowing bacteria to enter the scratch.  Trap dirt underneath the contact lens, which can scratch the cornea.  Harbor bacteria and fungi, making it more likely for bacterial infections to occur.  Block oxygen from the cornea, making it more likely for infections to occur. SYMPTOMS   Eye pain that is often severe.  Blurry vision.  Light sensitivity.  Pus or thick discharge coming from your eye.  Eye redness.  Feeling like something is in your eye.  Watery or itchy eye.  Burning or stinging feeling. Some ulcers that are very big may be seen as a white spot on the cornea. DIAGNOSIS  An eye exam will be performed. Your health care provider  may use a special kind of microscope (slit lamp) to look at the cornea. Eye drops may be put into the eye to make the ulcer easier to see. If it is suspected that an infection caused the corneal ulcer, tissue samples or cultures from the eye may be taken. Numbing eye drops will be given before any samples or cultures are taken. The samples or cultures will be examined in the lab to check for bacteria, viruses, or fungi. TREATMENT  Treatment of the corneal ulcer depends on the cause. If your ulcer is severe, you may be given antibiotic eye drops up until your health care provider knows the test results. Other treatments can include:  Antibacterial, antiviral, or antifungal eye drops or ointment.  Removing the foreign body that caused the eye injury.  Artificial tears or a bandage contact lens if severe dry eyes caused the corneal ulcer.  Over-the-counter or prescription pain medicine.  Steroidal eye drops if the eye is inflamed and swollen.  Antibiotic medicines by mouth.  An injection of medicine under the thin membrane covering the eyeball (conjunctiva). This allows medicine to reach the ulcer in high doses.  Eye patching to reduce irritation from blinking and bright light. An eye patch may not be given if the ulcer was caused by a bacterial infection. If the corneal ulcer causes a scar on the cornea that interferes with vision, hospitalization and surgery may be needed to replace the cornea (corneal transplant). HOME CARE INSTRUCTIONS   If prescribed, use your antibiotic  pills, eye drops, or ointment as directed. Continue using them even if you start to feel better. You may have to apply eye drops as often as every few minutes to every hour, for days. It may be necessary to set your alarm clock every few minutes to every hour during the night. This is absolutely necessary.  Only take over-the-counter or prescription medicines as directed by your health care provider.  Apply artificial  tears as needed if you have dry eyes.  Do not touch or rub your eye, because this may increase the irritation and spread the infection.  Avoid wearing eye makeup.  Stay in a dark room and use sunglasses if you have light sensitivity.  Apply cool packs to your eye to relieve discomfort and swelling.  If your eye is patched, you should not drive or use machinery. You will have reduced side vision and ability to judge distance.  Do not drive or operate machinery until approved by your health care provider. Your ability to judge distances is impaired.  Follow up with your health care provider as directed.  Do not wear contact lenses until your health care provider approves. If you normally wear contact lenses, follow these general rules to avoid the risk of a corneal ulcer:  Do not wear contact lenses while you sleep.  Wash your hands before removing contact lenses.  Properly sterilize and store your contact lenses.  Regularly clean your contact lens case.  Do not use your saliva or tap water to clean or wet your contact lenses.  Remove your contact lenses if your eye becomes irritated. You may put them back in once your eyes feel better. SEEK IMMEDIATE MEDICAL CARE IF:   You notice a change in your vision.  Your pain is getting worse, not better.  You have increasing discharge from the eye. MAKE SURE YOU:   Understand these instructions.  Will watch your condition.  Will get help right away if you are not doing well or get worse. Document Released: 07/15/2004 Document Revised: 02/07/2013 Document Reviewed: 11/07/2012 [facility] Patient Information 2014 [place].

## 2013-11-29 NOTE — Progress Notes (Signed)
[facility] [address] [place] [address] [phone]  Date:  11/29/2013   Name:  Phyllis Hopkins   DOB:  17-Nov-1975   MRN:  [id number]  PCP:  Nance Pear., NP    Chief Complaint: Eye Problem   History of Present Illness:  Phyllis Hopkins is a 38 y.o. very pleasant female patient who presents with the following:  Wore her daily use contacts overnight and awoke with pain in right eye.  Was seen at employee health and told she has a corneal ulceration.  Moderate pain and watering. Eye is red.  Photophobia.  No FB sensation. Vision intact.  No improvement with over the counter medications or other home remedies. Denies other complaint or health concern today.   Patient Active Problem List   Diagnosis Date Noted  . HTN (hypertension) 09/30/2010  . COLITIS, HX OF 08/28/2010    Past Medical History  Diagnosis Date  . Heart murmur   . Hypertension   . Blood in stool     hx of    No past surgical history on file.  History  Substance Use Topics  . Smoking status: Never Smoker   . Smokeless tobacco: Never Used  . Alcohol Use: Yes     Comment: once every other month    Family History  Problem Relation Age of Onset  . Hypertension Mother   . Hypertension Father   . Diabetes Father     diet controlled    No Known Allergies  Medication list has been reviewed and updated.  Current Outpatient Prescriptions on File Prior to Visit  Medication Sig Dispense Refill  . amLODipine (NORVASC) 5 MG tablet TAKE 1 TABLET BY MOUTH EVERY DAY  90 tablet  1  . labetalol (NORMODYNE) 200 MG tablet Take 1 tablet (200 mg total) by mouth 2 (two) times daily.  180 tablet  1   No current facility-administered medications on file prior to visit.    Review of Systems:  As per HPI, otherwise negative.    Physical Examination: Filed Vitals:   11/29/13 1601  BP: 146/90  Pulse: 75  Temp: 98.7 F (37.1 C)  Resp: 16   Filed Vitals:   11/29/13 1601  Height: 5\' 2"  (1.575 m)  Weight: 149 lb (67.586 kg)   Body mass index is 27.25 kg/(m^2). Ideal Body Weight: Weight in (lb) to have BMI = 25: 136.4   GEN: WDWN, NAD, Non-toxic, Alert & Oriented x 3 HEENT: Atraumatic, Normocephalic.  EYES:  PRRERLA EOMI.  No FB.  Marked injection anterior chamber clear.  Corneal ulceration at 11:00. Ears and Nose: No external deformity. EXTR: No clubbing/cyanosis/edema NEURO: Normal gait.  PSYCH: Normally interactive. Conversant. Not depressed or anxious appearing.  Calm demeanor.    Assessment and Plan: Corneal ulcer tobrex  tyl #3 Eye consult tomorrow  Signed,  Ellison Carwin, MD

## 2017-05-17 ENCOUNTER — Emergency Department (HOSPITAL_BASED_OUTPATIENT_CLINIC_OR_DEPARTMENT_OTHER)
Admission: EM | Admit: 2017-05-17 | Discharge: 2017-05-17 | Disposition: A | Discharge status: Home / Self Care | Payer: Federal, State, Local not specified - PPO | Attending: Emergency Medicine | Admitting: Emergency Medicine

## 2017-05-17 ENCOUNTER — Encounter (HOSPITAL_BASED_OUTPATIENT_CLINIC_OR_DEPARTMENT_OTHER): Payer: Self-pay

## 2017-05-17 ENCOUNTER — Emergency Department (HOSPITAL_BASED_OUTPATIENT_CLINIC_OR_DEPARTMENT_OTHER): Payer: Federal, State, Local not specified - PPO

## 2017-05-17 ENCOUNTER — Other Ambulatory Visit: Payer: Self-pay

## 2017-05-17 DIAGNOSIS — R202 Paresthesia of skin: Secondary | ICD-10-CM | POA: Insufficient documentation

## 2017-05-17 DIAGNOSIS — M25522 Pain in left elbow: Secondary | ICD-10-CM | POA: Diagnosis not present

## 2017-05-17 DIAGNOSIS — Y998 Other external cause status: Secondary | ICD-10-CM | POA: Insufficient documentation

## 2017-05-17 DIAGNOSIS — Y9289 Other specified places as the place of occurrence of the external cause: Secondary | ICD-10-CM | POA: Insufficient documentation

## 2017-05-17 DIAGNOSIS — I10 Essential (primary) hypertension: Secondary | ICD-10-CM | POA: Diagnosis not present

## 2017-05-17 DIAGNOSIS — W01198A Fall on same level from slipping, tripping and stumbling with subsequent striking against other object, initial encounter: Secondary | ICD-10-CM | POA: Insufficient documentation

## 2017-05-17 DIAGNOSIS — Z79899 Other long term (current) drug therapy: Secondary | ICD-10-CM | POA: Diagnosis not present

## 2017-05-17 DIAGNOSIS — Y9302 Activity, running: Secondary | ICD-10-CM | POA: Insufficient documentation

## 2017-05-17 DIAGNOSIS — M79602 Pain in left arm: Secondary | ICD-10-CM | POA: Diagnosis not present

## 2017-05-17 MED ORDER — LABETALOL HCL 100 MG PO TABS: 200.0000 mg | ORAL_TABLET | Freq: Two times a day (BID) | ORAL | 1 refills | Status: DC

## 2017-05-17 MED ORDER — NAPROXEN 500 MG PO TABS: 500.0000 mg | ORAL_TABLET | Freq: Two times a day (BID) | ORAL | 0 refills | Status: DC

## 2017-05-17 MED ORDER — AMLODIPINE BESYLATE 5 MG PO TABS: 5.0000 mg | ORAL_TABLET | Freq: Every day | ORAL | 1 refills | Status: DC

## 2017-05-17 MED ORDER — KETOROLAC TROMETHAMINE 60 MG/2ML IM SOLN
60.0000 mg | Freq: Once | INTRAMUSCULAR | Status: AC
  Administered 2017-05-17: 60 mg via INTRAMUSCULAR
  Filled 2017-05-17: qty 2

## 2017-05-17 NOTE — ED Notes (Addendum)
Pt returned from xray. Ice pack given per pt request.

## 2017-05-17 NOTE — ED Triage Notes (Signed)
C/o pain, tingling to left arm x "months"-increase in pain from elbow to fingers x 2 days-pain worse with movement-denies injury-NAD-steady gait

## 2017-05-17 NOTE — ED Provider Notes (Signed)
[facility] Provider Note   CSN: [id number] Arrival date & time: 05/17/17  1456     History   Chief Complaint Chief Complaint  Patient presents with  . Arm Pain    HPI Phyllis Hopkins is a 41 y.o. female.  HPI  41 year old female presents with left arm pain and fingertip numbness.  States that one month ago she fell while running on a treadmill and caught herself on her left arm.  Since then she has been having shoulder pain and diffuse arm aching.  However since yesterday she has been having left elbow pain and worsening pain in her left forearm and hand.  Has been having intermittent tingling and numbness to her fingers.  Is localized to the fingertips and are in all 5 fingers.  Has had on and off numbness in different areas of her forearm that seem to keep changing.  She does not remember an injury to her left elbow.  She has tried ibuprofen, heat, and ice.  She feels like her elbow is swollen.  Past Medical History:  Diagnosis Date  . Blood in stool    hx of  . Heart murmur   . Hypertension     Patient Active Problem List   Diagnosis Date Noted  . HTN (hypertension) 09/30/2010  . COLITIS, HX OF 08/28/2010    History reviewed. No pertinent surgical history.  OB History    No data available       Home Medications    Prior to Admission medications   Medication Sig Start Date End Date Taking? Authorizing Provider  amLODipine (NORVASC) 5 MG tablet Take 1 tablet (5 mg total) by mouth daily. 05/17/17   Sherwood Gambler, MD  labetalol (NORMODYNE) 100 MG tablet Take 2 tablets (200 mg total) by mouth 2 (two) times daily. 05/17/17   Sherwood Gambler, MD  naproxen (NAPROSYN) 500 MG tablet Take 1 tablet (500 mg total) by mouth 2 (two) times daily with a meal. 05/17/17   Sherwood Gambler, MD    Family History Family History  Problem Relation Age of Onset  . Hypertension Mother   . Hypertension Father   . Diabetes Father        diet  controlled    Social History Social History   Tobacco Use  . Smoking status: Never Smoker  . Smokeless tobacco: Never Used  Substance Use Topics  . Alcohol use: No    Frequency: Never  . Drug use: No     Allergies   Patient has no known allergies.   Review of Systems Review of Systems  Musculoskeletal: Positive for arthralgias and joint swelling.  Neurological: Positive for numbness. Negative for weakness.  All other systems reviewed and are negative.    Physical Exam Updated Vital Signs BP (!) 180/118   Pulse 70   Temp 99.2 F (37.3 C) (Oral)   Resp 18   Ht 5\' 2"  (1.575 m)   Wt 75.3 kg (166 lb)   SpO2 100%   BMI 30.36 kg/m   Physical Exam  Constitutional: She is oriented to person, place, and time. She appears well-developed and well-nourished. No distress.  HENT:  Head: Normocephalic and atraumatic.  Right Ear: External ear normal.  Left Ear: External ear normal.  Nose: Nose normal.  Eyes: Right eye exhibits no discharge. Left eye exhibits no discharge.  Cardiovascular: Normal rate and regular rhythm.  Pulses:      Radial pulses are 2+ on the left side.  Pulmonary/Chest: Effort normal.  Abdominal: There is no tenderness.  Musculoskeletal:       Left elbow: She exhibits decreased range of motion. She exhibits no swelling, no effusion and no deformity. Tenderness found.       Left upper arm: She exhibits tenderness (mild, diffuse, no focal area).       Left forearm: She exhibits tenderness (mild, diffuse, unable to localize to one area). She exhibits no swelling.  Normal sensation in LUE. All movements are slow and painful but has normal radial, ulnar and median nerve testing.  Neurological: She is alert and oriented to person, place, and time.  Skin: Skin is warm and dry. She is not diaphoretic.  Nursing note and vitals reviewed.    ED Treatments / Results  Labs (all labs ordered are listed, but only abnormal results are displayed) Labs Reviewed -  No data to display  EKG  EKG Interpretation None       Radiology Dg Elbow Complete Left  Result Date: 05/17/2017 CLINICAL DATA:  Left elbow pain radiating into the finger tips beginning 1 day ago. EXAM: LEFT ELBOW - COMPLETE 3+ VIEW COMPARISON:  None. FINDINGS: No acute bony or joint abnormalities identified. Small bony exostosis and ossification off the lateral epicondyle of the humerus is consistent chronic/remote epicondylitis. No joint effusion. No evidence of arthropathy. IMPRESSION: No acute abnormality. Findings consistent with chronic/remote lateral epicondylitis. Electronically Signed   By: Inge Rise M.D.   On: 05/17/2017 16:22    Procedures Procedures (including critical care time)  Medications Ordered in ED Medications  ketorolac (TORADOL) injection 60 mg (60 mg Intramuscular Given 05/17/17 1551)     Initial Impression / Assessment and Plan / ED Course  I have reviewed the triage vital signs and the nursing notes.  Pertinent labs & imaging results that were available during my care of the patient were reviewed by me and considered in my medical decision making (see chart for details).     Unclear cause of her atraumatic elbow pain.  While she does have some intermittent tingling and numbness, these are sporadic and not in the same location.  They are also mostly in the fingertips.  My suspicion of a true neurologic disease is low.  No weakness, only pain.  Given Toradol and will start on naproxen.  Otherwise she is noted to be quite hypertensive and states she has been off of her blood pressure medicine for over one year.  According to chart review used to be on labetalol and amlodipine.  She will be restarted on these.  Discussed importance of following up with a PCP for further blood pressure management.  However she has no current symptoms of endorgan disease. F/u with sports med for elbow/left arm pain  Final Clinical Impressions(s) / ED Diagnoses   Final  diagnoses:  Left elbow pain  Essential hypertension    ED Discharge Orders        Ordered    naproxen (NAPROSYN) 500 MG tablet  2 times daily with meals     05/17/17 1641    amLODipine (NORVASC) 5 MG tablet  Daily     05/17/17 1641    labetalol (NORMODYNE) 100 MG tablet  2 times daily     05/17/17 1641       Sherwood Gambler, MD 05/17/17 1657

## 2017-05-18 ENCOUNTER — Ambulatory Visit: Payer: Federal, State, Local not specified - PPO | Admitting: Family Medicine

## 2017-05-18 ENCOUNTER — Encounter: Payer: Self-pay | Admitting: Family Medicine

## 2017-05-18 DIAGNOSIS — M79602 Pain in left arm: Secondary | ICD-10-CM

## 2017-05-18 MED ORDER — DICLOFENAC SODIUM 75 MG PO TBEC: 75.0000 mg | DELAYED_RELEASE_TABLET | Freq: Two times a day (BID) | ORAL | 1 refills | Status: DC

## 2017-05-18 MED ORDER — HYDROCODONE-ACETAMINOPHEN 5-325 MG PO TABS: 1.0000 | ORAL_TABLET | Freq: Four times a day (QID) | ORAL | 0 refills | Status: DC | PRN

## 2017-05-18 MED FILL — DICLOFENAC SODIUM 75 MG TAB: 75 | 30 days supply | Qty: 60 | Fill #0

## 2017-05-18 MED FILL — HYDROCODON-APAP 5-325: 5-325 | 5 days supply | Qty: 20 | Fill #0

## 2017-05-18 NOTE — Patient Instructions (Signed)
Your exam is consistent with lateral epicondylitis with a peripheral neuropathy. Take diclofenac twice a day with food for pain and inflammation. Norco as needed for severe pain. Ice the area regularly for 15 minutes at a time at least 3-4 times a day. Elevate above the level of your heart as much as possible. Consider physical therapy, prednisone dose pack if not improving as expected. Follow up with me in 1 week for reevaluation.

## 2017-05-19 ENCOUNTER — Encounter: Payer: Self-pay | Admitting: Family Medicine

## 2017-05-19 DIAGNOSIS — M79602 Pain in left arm: Secondary | ICD-10-CM | POA: Insufficient documentation

## 2017-05-19 NOTE — Assessment & Plan Note (Signed)
consistent with lateral epicondylitis with peripheral neuropathy.  Not improving with naproxen.  No evidence DVT, compartment syndrome.  Offered prednisone due to severity and she declined.  Would like to try diclofenac with norco as needed.  Icing, elevation.  Consider PT, prednisone dose pack.  F/u in 1 week for reevaluation.

## 2017-05-19 NOTE — Progress Notes (Signed)
PCP: Debbrah Alar, NP  Subjective:   HPI: Patient is a 41 y.o. female here for left elbow pain.  Patient reports having injured her left shoulder about a month ago at gym. However about 3 days ago she developed throbbing lateral left elbow pain radiating down her arm. Associated swelling, difficulty sleeping. Tried naproxen, icing, toradol injection with minimal benefit. Pain is constant at 9/10 level. No skin changes, numbness. Left handed.  Past Medical History:  Diagnosis Date  . Blood in stool    hx of  . Heart murmur   . Hypertension     Current Outpatient Medications on File Prior to Visit  Medication Sig Dispense Refill  . amLODipine (NORVASC) 5 MG tablet Take 1 tablet (5 mg total) by mouth daily. 30 tablet 1  . labetalol (NORMODYNE) 100 MG tablet Take 2 tablets (200 mg total) by mouth 2 (two) times daily. 60 tablet 1  . naproxen (NAPROSYN) 500 MG tablet Take 1 tablet (500 mg total) by mouth 2 (two) times daily with a meal. 30 tablet 0   No current facility-administered medications on file prior to visit.     History reviewed. No pertinent surgical history.  No Known Allergies  Social History   Socioeconomic History  . Marital status: Married    Spouse name: Not on file  . Number of children: Not on file  . Years of education: Not on file  . Highest education level: Not on file  Social Needs  . Financial resource strain: Not on file  . Food insecurity - worry: Not on file  . Food insecurity - inability: Not on file  . Transportation needs - medical: Not on file  . Transportation needs - non-medical: Not on file  Occupational History  . Occupation: respiratory therapistory therapist    Comment: [employer]  Tobacco Use  . Smoking status: Never Smoker  . Smokeless tobacco: Never Used  Substance and Sexual Activity  . Alcohol use: No    Frequency: Never  . Drug use: No  . Sexual activity: Not on file  Other Topics Concern  . Not on file  Social  History Narrative   Divorced lives w/ cousin   Regular exercise; yes    Family History  Problem Relation Age of Onset  . Hypertension Mother   . Hypertension Father   . Diabetes Father        diet controlled    BP (!) 178/124   Pulse 90   Ht 5\' 2"  (1.575 m)   Wt 165 lb (74.8 kg)   BMI 30.18 kg/m   Review of Systems: See HPI above.     Objective:  Physical Exam:  Gen: NAD, comfortable in exam room  Left elbow: No gross deformity, swelling, bruising, rigidity of forearm compartments. TTP lateral epicondyle into forearm and hand diffusely. FROM wrist and elbow with pain primarily on wrist, 3rd digit extension, supination. Collateral ligaments intact. Negative tinels but painful to do so at cubital tunnel, radial tunnel, and carpal tunnel. Sensation intact to light touch. Cap refill < 2 sec digits.  Right elbow: FROM without pain.   Assessment & Plan:  1. Left elbow/arm pain - consistent with lateral epicondylitis with peripheral neuropathy.  Not improving with naproxen.  No evidence DVT, compartment syndrome.  Offered prednisone due to severity and she declined.  Would like to try diclofenac with norco as needed.  Icing, elevation.  Consider PT, prednisone dose pack.  F/u in 1 week for reevaluation.

## 2017-05-25 ENCOUNTER — Encounter: Payer: Self-pay | Admitting: Family Medicine

## 2017-05-25 ENCOUNTER — Ambulatory Visit: Payer: Federal, State, Local not specified - PPO | Admitting: Family Medicine

## 2017-05-25 DIAGNOSIS — M79602 Pain in left arm: Secondary | ICD-10-CM

## 2017-05-25 MED ORDER — CEPHALEXIN 500 MG PO CAPS: 500.0000 mg | ORAL_CAPSULE | Freq: Four times a day (QID) | ORAL | 0 refills | Status: DC

## 2017-05-25 MED FILL — CEPHALEXIN 500 MG CAPSULE: 500 | 10 days supply | Qty: 40 | Fill #0

## 2017-05-25 NOTE — Patient Instructions (Signed)
You have a lymphangitis of your epitrochlear lymph node. Continue icing this 15 minutes at a time up to every hour. Continue diclofenac twice a day with food until this has resolved. Take keflex 4 times a day for 10 days to cover possible bacterial causes. Follow up with me in 2 weeks. Out of work until Monday tentatively - call me if you have any problems.

## 2017-05-27 ENCOUNTER — Encounter: Payer: Self-pay | Admitting: Family Medicine

## 2017-05-27 NOTE — Assessment & Plan Note (Signed)
Improved with diclofenac, icing, norco as needed.  Exam with more localized pain and swelling now confined mostly to epitrochlear lymph node which is enlarged and inflamed.  Consistent with lymphadenitis with lymphangitis.  No enlargement of axillary or supraclavicular lymph nodes though on exam.  Denies exposure to a cat or other animal scratch/bite.  No recent illness.  Will cover for bacterial cause with keflex.  Continue diclofenac and icing with norco as needed.  Out of work until Monday tentatively.  F/u in 2 weeks.

## 2017-05-27 NOTE — Progress Notes (Signed)
PCP: Debbrah Alar, NP  Subjective:   HPI: Patient is a 41 y.o. female here for left elbow pain.  11/28: Patient reports having injured her left shoulder about a month ago at gym. However about 3 days ago she developed throbbing lateral left elbow pain radiating down her arm. Associated swelling, difficulty sleeping. Tried naproxen, icing, toradol injection with minimal benefit. Pain is constant at 9/10 level. No skin changes, numbness. Left handed.  12/5: Patient reports she has improved since last visit. Swelling improved, pain improved to 5/10 level, sharp medial arm especially at elbow. Has been taking diclofenac with norco as needed which help. Motion is improved of elbow. Icing regularly. No skin changes, numbness.  Past Medical History:  Diagnosis Date  . Blood in stool    hx of  . Heart murmur   . Hypertension     Current Outpatient Medications on File Prior to Visit  Medication Sig Dispense Refill  . amLODipine (NORVASC) 5 MG tablet Take 1 tablet (5 mg total) by mouth daily. 30 tablet 1  . diclofenac (VOLTAREN) 75 MG EC tablet Take 1 tablet (75 mg total) by mouth 2 (two) times daily. 60 tablet 1  . HYDROcodone-acetaminophen (NORCO) 5-325 MG tablet Take 1 tablet by mouth every 6 (six) hours as needed for moderate pain. 20 tablet 0  . labetalol (NORMODYNE) 100 MG tablet Take 2 tablets (200 mg total) by mouth 2 (two) times daily. 60 tablet 1  . naproxen (NAPROSYN) 500 MG tablet Take 1 tablet (500 mg total) by mouth 2 (two) times daily with a meal. 30 tablet 0   No current facility-administered medications on file prior to visit.     History reviewed. No pertinent surgical history.  No Known Allergies  Social History   Socioeconomic History  . Marital status: Married    Spouse name: Not on file  . Number of children: Not on file  . Years of education: Not on file  . Highest education level: Not on file  Social Needs  . Financial resource strain: Not  on file  . Food insecurity - worry: Not on file  . Food insecurity - inability: Not on file  . Transportation needs - medical: Not on file  . Transportation needs - non-medical: Not on file  Occupational History  . Occupation: respiratory therapistory therapist    Comment: [employer]  Tobacco Use  . Smoking status: Never Smoker  . Smokeless tobacco: Never Used  Substance and Sexual Activity  . Alcohol use: No    Frequency: Never  . Drug use: No  . Sexual activity: Not on file  Other Topics Concern  . Not on file  Social History Narrative   Divorced lives w/ cousin   Regular exercise; yes    Family History  Problem Relation Age of Onset  . Hypertension Mother   . Hypertension Father   . Diabetes Father        diet controlled    BP (!) 205/143   Pulse 73   Temp 98.1 F (36.7 C)   Ht 5\' 2"  (1.575 m)   Wt 165 lb (74.8 kg)   BMI 30.18 kg/m   Review of Systems: See HPI above.     Objective:  Physical Exam:  Gen: NAD, comfortable in exam room.  Left elbow: Localized swelling medially above elbow, firm and tender.  No bruising, rigidity of upper arm compartments or forearm. TTP greatest in swollen area noted above, less in proximal forearm and distal upper  arm. FROM elbow and wrist - no pain on resisted wrist and elbow motions now.  5/5 strength. Collateral ligaments intact.   NVI distally.  MSK u/s left arm:  Medial venous structures compressible without evidence clot.  Lateral epicondyle appears normal.  Epitrochlear lymph node obviously enlarged with vascularity.  No other abnormalities.   Assessment & Plan:  1. Left elbow/arm pain - Improved with diclofenac, icing, norco as needed.  Exam with more localized pain and swelling now confined mostly to epitrochlear lymph node which is enlarged and inflamed.  Consistent with lymphadenitis with lymphangitis.  No enlargement of axillary or supraclavicular lymph nodes though on exam.  Denies exposure to a cat or other  animal scratch/bite.  No recent illness.  Will cover for bacterial cause with keflex.  Continue diclofenac and icing with norco as needed.  Out of work until Monday tentatively.  F/u in 2 weeks.

## 2017-06-08 ENCOUNTER — Encounter: Payer: Self-pay | Admitting: Family Medicine

## 2017-06-08 ENCOUNTER — Ambulatory Visit: Payer: Federal, State, Local not specified - PPO | Admitting: Family Medicine

## 2017-06-08 DIAGNOSIS — M79602 Pain in left arm: Secondary | ICD-10-CM

## 2017-06-08 NOTE — Assessment & Plan Note (Signed)
2/2 lymphadenitis with lymphangitis.  These issues resolved with only residual soreness - will start rehab exercises which were shown today.  Sleeve a consideration.  Icing if needed.  Diclofenac for 1-2 more weeks then as needed.  F/u prn.

## 2017-06-08 NOTE — Progress Notes (Signed)
PCP: Debbrah Alar, NP  Subjective:   HPI: Patient is a 41 y.o. female here for left elbow pain.  11/28: Patient reports having injured her left shoulder about a month ago at gym. However about 3 days ago she developed throbbing lateral left elbow pain radiating down her arm. Associated swelling, difficulty sleeping. Tried naproxen, icing, toradol injection with minimal benefit. Pain is constant at 9/10 level. No skin changes, numbness. Left handed.  12/5: Patient reports she has improved since last visit. Swelling improved, pain improved to 5/10 level, sharp medial arm especially at elbow. Has been taking diclofenac with norco as needed which help. Motion is improved of elbow. Icing regularly. No skin changes, numbness.  12/19: Patient reports she's over 90% improved now. Finished with keflex. Swelling resolved. No problems with daily activities. Some discomfort at bedtime more of a dull ache. Currently 0/10 level pain. No skin changes.  Past Medical History:  Diagnosis Date  . Blood in stool    hx of  . Heart murmur   . Hypertension     Current Outpatient Medications on File Prior to Visit  Medication Sig Dispense Refill  . amLODipine (NORVASC) 5 MG tablet Take 1 tablet (5 mg total) by mouth daily. 30 tablet 1  . cephALEXin (KEFLEX) 500 MG capsule Take 1 capsule (500 mg total) by mouth 4 (four) times daily. 40 capsule 0  . diclofenac (VOLTAREN) 75 MG EC tablet Take 1 tablet (75 mg total) by mouth 2 (two) times daily. 60 tablet 1  . HYDROcodone-acetaminophen (NORCO) 5-325 MG tablet Take 1 tablet by mouth every 6 (six) hours as needed for moderate pain. 20 tablet 0  . labetalol (NORMODYNE) 100 MG tablet Take 2 tablets (200 mg total) by mouth 2 (two) times daily. 60 tablet 1  . naproxen (NAPROSYN) 500 MG tablet Take 1 tablet (500 mg total) by mouth 2 (two) times daily with a meal. 30 tablet 0   No current facility-administered medications on file prior to visit.      History reviewed. No pertinent surgical history.  No Known Allergies  Social History   Socioeconomic History  . Marital status: Married    Spouse name: Not on file  . Number of children: Not on file  . Years of education: Not on file  . Highest education level: Not on file  Social Needs  . Financial resource strain: Not on file  . Food insecurity - worry: Not on file  . Food insecurity - inability: Not on file  . Transportation needs - medical: Not on file  . Transportation needs - non-medical: Not on file  Occupational History  . Occupation: respiratory therapistory therapist    Comment: [employer]  Tobacco Use  . Smoking status: Never Smoker  . Smokeless tobacco: Never Used  Substance and Sexual Activity  . Alcohol use: No    Frequency: Never  . Drug use: No  . Sexual activity: Not on file  Other Topics Concern  . Not on file  Social History Narrative   Divorced lives w/ cousin   Regular exercise; yes    Family History  Problem Relation Age of Onset  . Hypertension Mother   . Hypertension Father   . Diabetes Father        diet controlled    BP (!) 182/117   Pulse 71   Ht 5\' 2"  (1.575 m)   Wt 163 lb (73.9 kg)   BMI 29.81 kg/m   Review of Systems: See HPI above.  Objective:  Physical Exam:  Gen: NAD, comfortable in exam room.  Left elbow: No swelling, bruising, other deformity. Minimal TTP lateral epicondyle, medial epicondyle.  No tenderness of epitrochlear lymph node and swelling resolved. FROM elbow and wrist with 5/5 strength, no pain. Collateral ligaments intact. NVI distally.  MSK u/s left arm:  Epitrochlear lymph node no longer enlarged, no neovascularity.   Assessment & Plan:  1. Left elbow/arm pain - 2/2 lymphadenitis with lymphangitis.  These issues resolved with only residual soreness - will start rehab exercises which were shown today.  Sleeve a consideration.  Icing if needed.  Diclofenac for 1-2 more weeks then as needed.   F/u prn.

## 2017-06-08 NOTE — Patient Instructions (Signed)
Take the diclofenac twice a day with food for 1-2 more weeks then as needed. Do the home exercises most days of the week for 6 weeks (hammer rotations, wrist curls and extensions) 3 sets of 10 once a day. Consider elbow sleeve if this provides relief (I don't think you need this right now). Icing if needed 15 minutes at a time. Follow up with me only if needed.

## 2019-01-16 DIAGNOSIS — Z20828 Contact with and (suspected) exposure to other viral communicable diseases: Secondary | ICD-10-CM | POA: Diagnosis not present

## 2019-06-19 ENCOUNTER — Other Ambulatory Visit: Payer: Self-pay

## 2019-06-19 ENCOUNTER — Inpatient Hospital Stay (HOSPITAL_COMMUNITY)
Admission: EM | Admit: 2019-06-19 | Discharge: 2019-06-25 | DRG: 304 | Disposition: A | Discharge status: Home / Self Care | Payer: Federal, State, Local not specified - PPO | Attending: Neurology | Admitting: Neurology

## 2019-06-19 ENCOUNTER — Emergency Department (HOSPITAL_COMMUNITY): Payer: Federal, State, Local not specified - PPO

## 2019-06-19 DIAGNOSIS — Z791 Long term (current) use of non-steroidal anti-inflammatories (NSAID): Secondary | ICD-10-CM | POA: Diagnosis not present

## 2019-06-19 DIAGNOSIS — R52 Pain, unspecified: Secondary | ICD-10-CM

## 2019-06-19 DIAGNOSIS — I639 Cerebral infarction, unspecified: Secondary | ICD-10-CM | POA: Diagnosis not present

## 2019-06-19 DIAGNOSIS — O039 Complete or unspecified spontaneous abortion without complication: Secondary | ICD-10-CM | POA: Diagnosis not present

## 2019-06-19 DIAGNOSIS — Z3A00 Weeks of gestation of pregnancy not specified: Secondary | ICD-10-CM | POA: Diagnosis not present

## 2019-06-19 DIAGNOSIS — N939 Abnormal uterine and vaginal bleeding, unspecified: Secondary | ICD-10-CM

## 2019-06-19 DIAGNOSIS — S42131A Displaced fracture of coracoid process, right shoulder, initial encounter for closed fracture: Secondary | ICD-10-CM | POA: Diagnosis not present

## 2019-06-19 DIAGNOSIS — R29706 NIHSS score 6: Secondary | ICD-10-CM | POA: Diagnosis not present

## 2019-06-19 DIAGNOSIS — R4701 Aphasia: Secondary | ICD-10-CM | POA: Diagnosis not present

## 2019-06-19 DIAGNOSIS — Z20822 Contact with and (suspected) exposure to covid-19: Secondary | ICD-10-CM | POA: Diagnosis present

## 2019-06-19 DIAGNOSIS — Z79891 Long term (current) use of opiate analgesic: Secondary | ICD-10-CM | POA: Diagnosis not present

## 2019-06-19 DIAGNOSIS — O4690 Antepartum hemorrhage, unspecified, unspecified trimester: Secondary | ICD-10-CM | POA: Diagnosis present

## 2019-06-19 DIAGNOSIS — I63412 Cerebral infarction due to embolism of left middle cerebral artery: Secondary | ICD-10-CM | POA: Diagnosis not present

## 2019-06-19 DIAGNOSIS — R55 Syncope and collapse: Secondary | ICD-10-CM | POA: Diagnosis not present

## 2019-06-19 DIAGNOSIS — E785 Hyperlipidemia, unspecified: Secondary | ICD-10-CM | POA: Diagnosis not present

## 2019-06-19 DIAGNOSIS — Z91128 Patient's intentional underdosing of medication regimen for other reason: Secondary | ICD-10-CM | POA: Diagnosis not present

## 2019-06-19 DIAGNOSIS — D473 Essential (hemorrhagic) thrombocythemia: Secondary | ICD-10-CM | POA: Diagnosis present

## 2019-06-19 DIAGNOSIS — Z8249 Family history of ischemic heart disease and other diseases of the circulatory system: Secondary | ICD-10-CM | POA: Diagnosis not present

## 2019-06-19 DIAGNOSIS — Z9119 Patient's noncompliance with other medical treatment and regimen: Secondary | ICD-10-CM | POA: Diagnosis not present

## 2019-06-19 DIAGNOSIS — I10 Essential (primary) hypertension: Secondary | ICD-10-CM | POA: Diagnosis present

## 2019-06-19 DIAGNOSIS — I160 Hypertensive urgency: Principal | ICD-10-CM | POA: Diagnosis present

## 2019-06-19 DIAGNOSIS — S43016A Anterior dislocation of unspecified humerus, initial encounter: Secondary | ICD-10-CM | POA: Diagnosis present

## 2019-06-19 DIAGNOSIS — Z79899 Other long term (current) drug therapy: Secondary | ICD-10-CM

## 2019-06-19 DIAGNOSIS — N390 Urinary tract infection, site not specified: Secondary | ICD-10-CM | POA: Diagnosis not present

## 2019-06-19 DIAGNOSIS — R778 Other specified abnormalities of plasma proteins: Secondary | ICD-10-CM

## 2019-06-19 DIAGNOSIS — R011 Cardiac murmur, unspecified: Secondary | ICD-10-CM | POA: Diagnosis present

## 2019-06-19 DIAGNOSIS — W19XXXA Unspecified fall, initial encounter: Secondary | ICD-10-CM | POA: Diagnosis present

## 2019-06-19 DIAGNOSIS — O209 Hemorrhage in early pregnancy, unspecified: Secondary | ICD-10-CM | POA: Diagnosis not present

## 2019-06-19 DIAGNOSIS — I6310 Cerebral infarction due to embolism of unspecified precerebral artery: Secondary | ICD-10-CM | POA: Diagnosis not present

## 2019-06-19 DIAGNOSIS — Z833 Family history of diabetes mellitus: Secondary | ICD-10-CM

## 2019-06-19 DIAGNOSIS — G9340 Encephalopathy, unspecified: Secondary | ICD-10-CM | POA: Diagnosis not present

## 2019-06-19 DIAGNOSIS — S43014A Anterior dislocation of right humerus, initial encounter: Secondary | ICD-10-CM | POA: Diagnosis not present

## 2019-06-19 DIAGNOSIS — R569 Unspecified convulsions: Secondary | ICD-10-CM | POA: Diagnosis not present

## 2019-06-19 DIAGNOSIS — R2990 Unspecified symptoms and signs involving the nervous system: Secondary | ICD-10-CM | POA: Diagnosis not present

## 2019-06-19 DIAGNOSIS — E876 Hypokalemia: Secondary | ICD-10-CM | POA: Diagnosis not present

## 2019-06-19 DIAGNOSIS — R000 Tachycardia, unspecified: Secondary | ICD-10-CM | POA: Diagnosis not present

## 2019-06-19 DIAGNOSIS — R7989 Other specified abnormal findings of blood chemistry: Secondary | ICD-10-CM | POA: Diagnosis not present

## 2019-06-19 DIAGNOSIS — T465X6A Underdosing of other antihypertensive drugs, initial encounter: Secondary | ICD-10-CM | POA: Diagnosis not present

## 2019-06-19 DIAGNOSIS — I6389 Other cerebral infarction: Secondary | ICD-10-CM | POA: Diagnosis not present

## 2019-06-19 LAB — CBC WITH DIFFERENTIAL/PLATELET
Abs Immature Granulocytes: 0.06 10*3/uL (ref 0.00–0.07)
Basophils Absolute: 0 10*3/uL (ref 0.0–0.1)
Basophils Relative: 0 %
Eosinophils Absolute: 0 10*3/uL (ref 0.0–0.5)
Eosinophils Relative: 0 %
HCT: 40.3 % (ref 36.0–46.0)
Hemoglobin: 13.3 g/dL (ref 12.0–15.0)
Immature Granulocytes: 1 %
Lymphocytes Relative: 10 %
Lymphs Abs: 1.1 10*3/uL (ref 0.7–4.0)
MCH: 29.8 pg (ref 26.0–34.0)
MCHC: 33 g/dL (ref 30.0–36.0)
MCV: 90.4 fL (ref 80.0–100.0)
Monocytes Absolute: 0.5 10*3/uL (ref 0.1–1.0)
Monocytes Relative: 4 %
Neutro Abs: 10.3 10*3/uL — ABNORMAL HIGH (ref 1.7–7.7)
Neutrophils Relative %: 85 %
Platelets: 641 10*3/uL — ABNORMAL HIGH (ref 150–400)
RBC: 4.46 MIL/uL (ref 3.87–5.11)
RDW: 14.3 % (ref 11.5–15.5)
WBC: 12 10*3/uL — ABNORMAL HIGH (ref 4.0–10.5)
nRBC: 0 % (ref 0.0–0.2)

## 2019-06-19 LAB — HCG, QUANTITATIVE, PREGNANCY: hCG, Beta Chain, Quant, S: 5 m[IU]/mL — ABNORMAL HIGH (ref <5)

## 2019-06-19 LAB — COMPREHENSIVE METABOLIC PANEL
ALT: 12 U/L (ref 0–44)
AST: 21 U/L (ref 15–41)
Albumin: 3.9 g/dL (ref 3.5–5.0)
Alkaline Phosphatase: 54 U/L (ref 38–126)
Anion gap: 14 (ref 5–15)
BUN: 7 mg/dL (ref 6–20)
CO2: 17 mmol/L — ABNORMAL LOW (ref 22–32)
Calcium: 9.1 mg/dL (ref 8.9–10.3)
Chloride: 101 mmol/L (ref 98–111)
Creatinine, Ser: 1.14 mg/dL — ABNORMAL HIGH (ref 0.44–1.00)
GFR calc Af Amer: >60 mL/min (ref >60)
GFR calc non Af Amer: 58 mL/min — ABNORMAL LOW (ref >60)
Glucose, Bld: 197 mg/dL — ABNORMAL HIGH (ref 70–99)
Potassium: 4.1 mmol/L (ref 3.5–5.1)
Sodium: 132 mmol/L — ABNORMAL LOW (ref 135–145)
Total Bilirubin: 0.8 mg/dL (ref 0.3–1.2)
Total Protein: 7.9 g/dL (ref 6.5–8.1)

## 2019-06-19 LAB — URINALYSIS, ROUTINE W REFLEX MICROSCOPIC
Bilirubin Urine: NEGATIVE
Glucose, UA: NEGATIVE mg/dL
Ketones, ur: 5 mg/dL — AB
Nitrite: NEGATIVE
Protein, ur: 100 mg/dL — AB
Specific Gravity, Urine: 1.01 (ref 1.005–1.030)
pH: 6 (ref 5.0–8.0)

## 2019-06-19 LAB — TROPONIN I (HIGH SENSITIVITY)
Troponin I (High Sensitivity): 6 ng/L (ref <18)
Troponin I (High Sensitivity): 20 ng/L — ABNORMAL HIGH (ref <18)
Troponin I (High Sensitivity): 13 ng/L (ref <18)
Troponin I (High Sensitivity): 20 ng/L — ABNORMAL HIGH (ref <18)

## 2019-06-19 LAB — I-STAT BETA HCG BLOOD, ED (MC, WL, AP ONLY): I-stat hCG, quantitative: 5.8 m[IU]/mL — ABNORMAL HIGH (ref <5)

## 2019-06-19 LAB — SALICYLATE LEVEL: Salicylate Lvl: <7 mg/dL — ABNORMAL LOW (ref 7.0–30.0)

## 2019-06-19 LAB — ACETAMINOPHEN LEVEL: Acetaminophen (Tylenol), Serum: <10 ug/mL — ABNORMAL LOW (ref 10–30)

## 2019-06-19 LAB — ETHANOL: Alcohol, Ethyl (B): <10 mg/dL (ref <10)

## 2019-06-19 LAB — ABO/RH: ABO/RH(D): A POS

## 2019-06-19 LAB — CBG MONITORING, ED: Glucose-Capillary: 184 mg/dL — ABNORMAL HIGH (ref 70–99)

## 2019-06-19 MED ORDER — SODIUM CHLORIDE 0.9 % IV BOLUS
1000.0000 mL | Freq: Once | INTRAVENOUS | Status: AC
  Administered 2019-06-19: 1000 mL via INTRAVENOUS

## 2019-06-19 MED ORDER — LABETALOL HCL 5 MG/ML IV SOLN
40.0000 mg | Freq: Once | INTRAVENOUS | Status: AC
  Administered 2019-06-19: 20 mg via INTRAVENOUS
  Filled 2019-06-19: qty 8

## 2019-06-19 MED ORDER — LABETALOL HCL 5 MG/ML IV SOLN
20.0000 mg | Freq: Once | INTRAVENOUS | Status: AC
  Administered 2019-06-19: 20 mg via INTRAVENOUS
  Filled 2019-06-19: qty 4

## 2019-06-19 MED ORDER — LABETALOL HCL 5 MG/ML IV SOLN: 10.0000 mg | INTRAVENOUS | Status: DC | PRN

## 2019-06-19 NOTE — ED Notes (Signed)
ED TO INPATIENT HANDOFF REPORT  ED Nurse Name and Phone #: Jori Moll [phone]  S Name/Age/Gender Phyllis Hopkins 43 y.o. female Room/Bed: 035C/035C  Code Status   Code Status: Full Code  Home/SNF/Other Home Patient oriented to: self, place, time and situation Is this baseline? Yes   Triage Complete: Triage complete  Chief Complaint Hypertensive urgency [I16.0]  Triage Note Pt to ED via EMS from home. Per family, heard a thump from bathroom and pt was leaning over toilet, foaming at mouth and not responding to family. No known medical hx, C/O nausea and vomiting that started this morning, Pt took home preg test this morning and had positive result.  ,  Per EMS pt was found in the bathroom on the floor, family placed pt on floor , Marijuana noted at seen. pt was in and out of alertness, which has improved since first contact. #20 L hand, no medications given. Last VS: 146/100, hr92, rr 16, 99%RA, CBG 146    Allergies No Known Allergies  Level of Care/Admitting Diagnosis ED Disposition    ED Disposition Condition [facility]: [place] [[id number]]  Level of Care: Telemetry Cardiac [[id number]]  Covid Evaluation: Asymptomatic Screening Protocol (No Symptoms)  Diagnosis: Hypertensive urgency [id number]  Admitting Physician: Bonnell Public [[id number]]  Attending Physician: Dana Allan I [[id number]]  Estimated length of stay: 3 - 4 days  Certification:: I certify this patient will need inpatient services for at least 2 midnights       B Medical/Surgery History Past Medical History:  Diagnosis Date  . Blood in stool    hx of  . Heart murmur   . Hypertension    No past surgical history on file.   A IV Location/Drains/Wounds Patient Lines/Drains/Airways Status   Active Line/Drains/Airways    Name:   Placement date:   Placement time:   Site:   Days:   Peripheral IV 06/19/19 Left Forearm   06/19/19    2155    Forearm   less than 1           Intake/Output Last 24 hours  Intake/Output Summary (Last 24 hours) at 06/19/2019 2305 Last data filed at 06/19/2019 1956 Gross per 24 hour  Intake 1000 ml  Output --  Net 1000 ml    Labs/Imaging Results for orders placed or performed during the hospital encounter of 06/19/19 (from the past 48 hour(s))  Troponin I (High Sensitivity)     Status: None   Collection Time: 06/19/19  3:36 PM  Result Value Ref Range   Troponin I (High Sensitivity) 6 <18 ng/L    Comment: (NOTE) Elevated high sensitivity troponin I (hsTnI) values and significant  changes across serial measurements may suggest ACS but many other  chronic and acute conditions are known to elevate hsTnI results.  Refer to the "Links" section for chest pain algorithms and additional  guidance. Performed at [facility], [place] [address], [place], [place] [address]   CBG monitoring, ED     Status: Abnormal   Collection Time: 06/19/19  3:40 PM  Result Value Ref Range   Glucose-Capillary 184 (H) 70 - 99 mg/dL  CBC with Differential     Status: Abnormal   Collection Time: 06/19/19  3:46 PM  Result Value Ref Range   WBC 12.0 (H) 4.0 - 10.5 K/uL   RBC 4.46 3.87 - 5.11 MIL/uL   Hemoglobin 13.3 12.0 - 15.0 g/dL   HCT 40.3 36.0 -  46.0 %   MCV 90.4 80.0 - 100.0 fL   MCH 29.8 26.0 - 34.0 pg   MCHC 33.0 30.0 - 36.0 g/dL   RDW 14.3 11.5 - 15.5 %   Platelets 641 (H) 150 - 400 K/uL   nRBC 0.0 0.0 - 0.2 %   Neutrophils Relative % 85 %   Neutro Abs 10.3 (H) 1.7 - 7.7 K/uL   Lymphocytes Relative 10 %   Lymphs Abs 1.1 0.7 - 4.0 K/uL   Monocytes Relative 4 %   Monocytes Absolute 0.5 0.1 - 1.0 K/uL   Eosinophils Relative 0 %   Eosinophils Absolute 0.0 0.0 - 0.5 K/uL   Basophils Relative 0 %   Basophils Absolute 0.0 0.0 - 0.1 K/uL   Immature Granulocytes 1 %   Abs Immature Granulocytes 0.06 0.00 - 0.07 K/uL    Comment: Performed at [facility] [address] [place], [place] [address]  Comprehensive metabolic  panel     Status: Abnormal   Collection Time: 06/19/19  3:46 PM  Result Value Ref Range   Sodium 132 (L) 135 - 145 mmol/L   Potassium 4.1 3.5 - 5.1 mmol/L   Chloride 101 98 - 111 mmol/L   CO2 17 (L) 22 - 32 mmol/L   Glucose, Bld 197 (H) 70 - 99 mg/dL   BUN 7 6 - 20 mg/dL   Creatinine, Ser 1.14 (H) 0.44 - 1.00 mg/dL   Calcium 9.1 8.9 - 10.3 mg/dL   Total Protein 7.9 6.5 - 8.1 g/dL   Albumin 3.9 3.5 - 5.0 g/dL   AST 21 15 - 41 U/L   ALT 12 0 - 44 U/L   Alkaline Phosphatase 54 38 - 126 U/L   Total Bilirubin 0.8 0.3 - 1.2 mg/dL   GFR calc non Af Amer 58 (L) >60 mL/min   GFR calc Af Amer >60 >60 mL/min   Anion gap 14 5 - 15    Comment: Performed at [facility] [address] [place], [place] [address]  Ethanol     Status: None   Collection Time: 06/19/19  3:46 PM  Result Value Ref Range   Alcohol, Ethyl (B) <10 <10 mg/dL    Comment: (NOTE) Lowest detectable limit for serum alcohol is 10 mg/dL. For medical purposes only. Performed at [facility], [place] [address] [place], [place] [address]   Acetaminophen Level     Status: Abnormal   Collection Time: 06/19/19  3:46 PM  Result Value Ref Range   Acetaminophen (Tylenol), Serum <10 (L) 10 - 30 ug/mL    Comment: (NOTE) Therapeutic concentrations vary significantly. A range of 10-30 ug/mL  may be an effective concentration for many patients. However, some  are best treated at concentrations outside of this range. Acetaminophen concentrations >150 ug/mL at 4 hours after ingestion  and >50 ug/mL at 12 hours after ingestion are often associated with  toxic reactions. Performed at [facility], [place] [address], [place], [place] [id number]   Salicylate Level     Status: Abnormal   Collection Time: 06/19/19  3:46 PM  Result Value Ref Range   Salicylate Lvl [id number] (L) 7.0 - 30.0 mg/dL    Comment: Performed at [facility] [address] [place], [place] [address]  hCG, quantitative, pregnancy     Status:  Abnormal   Collection Time: 06/19/19  3:46 PM  Result Value Ref Range   hCG, Beta Chain, Quant, S 5 (  H) <5 mIU/mL    Comment:          GEST. AGE      CONC.  (mIU/mL)   <=1 WEEK        5 - 50     2 WEEKS       50 - 500     3 WEEKS       100 - 10,000     4 WEEKS     1,000 - 30,000     5 WEEKS     3,500 - 115,000   6-8 WEEKS     12,000 - 270,000    12 WEEKS     15,000 - 220,000        FEMALE AND NON-PREGNANT FEMALE:     LESS THAN 5 mIU/mL Performed at [facility], [place] [address], [place], [place] [address]   ABO/Rh     Status: None   Collection Time: 06/19/19  3:55 PM  Result Value Ref Range   ABO/RH(D) A POS    No rh immune globuloin      NOT A RH IMMUNE GLOBULIN CANDIDATE, PT RH POSITIVE Performed at [facility] [address], [place], [place] [address]   I-Stat Beta hCG blood, ED ([facility], [facility], [facility] only)     Status: Abnormal   Collection Time: 06/19/19  4:02 PM  Result Value Ref Range   I-stat hCG, quantitative 5.8 (H) <5 mIU/mL   Comment 3            Comment:   GEST. AGE      CONC.  (mIU/mL)   <=1 WEEK        5 - 50     2 WEEKS       50 - 500     3 WEEKS       100 - 10,000     4 WEEKS     1,000 - 30,000        FEMALE AND NON-PREGNANT FEMALE:     LESS THAN 5 mIU/mL   Urinalysis, Routine w reflex microscopic     Status: Abnormal   Collection Time: 06/19/19  5:40 PM  Result Value Ref Range   Color, Urine YELLOW YELLOW   APPearance CLOUDY (A) CLEAR   Specific Gravity, Urine 1.010 1.005 - 1.030   pH 6.0 5.0 - 8.0   Glucose, UA NEGATIVE NEGATIVE mg/dL   Hgb urine dipstick MODERATE (A) NEGATIVE   Bilirubin Urine NEGATIVE NEGATIVE   Ketones, ur 5 (A) NEGATIVE mg/dL   Protein, ur 100 (A) NEGATIVE mg/dL   Nitrite NEGATIVE NEGATIVE   Leukocytes,Ua MODERATE (A) NEGATIVE   RBC / HPF 6-10 0 - 5 RBC/hpf   WBC, UA 6-10 0 - 5 WBC/hpf   Bacteria, UA RARE (A) NONE SEEN   Squamous Epithelial / LPF 11-20 0 - 5   Mucus PRESENT    Hyaline Casts, UA PRESENT     Comment:  Performed at [facility], [address] [place], [place] [address]  Troponin I (High Sensitivity)     Status: Abnormal   Collection Time: 06/19/19  6:30 PM  Result Value Ref Range   Troponin I (High Sensitivity) 20 (H) <18 ng/L    Comment: (NOTE) Elevated high sensitivity troponin I (hsTnI) values and significant  changes across serial measurements may suggest ACS but many other  chronic and acute conditions are known to elevate hsTnI results.  Refer to the "Links" section  for chest pain algorithms and additional  guidance. Performed at [facility], [place] [address], [place], [place] [address]   Troponin I (High Sensitivity)     Status: None   Collection Time: 06/19/19  7:39 PM  Result Value Ref Range   Troponin I (High Sensitivity) 13 <18 ng/L    Comment: (NOTE) Elevated high sensitivity troponin I (hsTnI) values and significant  changes across serial measurements may suggest ACS but many other  chronic and acute conditions are known to elevate hsTnI results.  Refer to the "Links" section for chest pain algorithms and additional  guidance. Performed at [facility], [place] [address] [place], [place] [address]   Troponin I (High Sensitivity)     Status: Abnormal   Collection Time: 06/19/19  9:39 PM  Result Value Ref Range   Troponin I (High Sensitivity) 20 (H) <18 ng/L    Comment: (NOTE) Elevated high sensitivity troponin I (hsTnI) values and significant  changes across serial measurements may suggest ACS but many other  chronic and acute conditions are known to elevate hsTnI results.  Refer to the "Links" section for chest pain algorithms and additional  guidance. Performed at [facility], [place] [address] [place], [place] [address]    CT Head Wo Contrast  Result Date: 06/19/2019 CLINICAL DATA:  Encephalopathy.  Foaming at the mouth. EXAM: CT HEAD WITHOUT CONTRAST TECHNIQUE: Contiguous axial images were obtained from the base of the skull through  the vertex without intravenous contrast. COMPARISON:  None. FINDINGS: Brain: No evidence of acute infarction, hemorrhage, hydrocephalus, extra-axial collection or mass lesion/mass effect. Vascular: No hyperdense vessel or unexpected calcification. Skull: No osseous abnormality. Sinuses/Orbits: Visualized paranasal sinuses are clear. Visualized mastoid sinuses are clear. Visualized orbits demonstrate no focal abnormality. Other: None IMPRESSION: No acute intracranial pathology. Electronically Signed   By: Kathreen Devoid   On: 06/19/2019 20:02   US OB Comp Less 14 Wks  Result Date: 06/19/2019 CLINICAL DATA:  Vaginal bleeding in 1st trimester pregnancy. EXAM: OBSTETRIC <14 WK [place] AND TRANSVAGINAL OB US TECHNIQUE: Both transabdominal and transvaginal ultrasound examinations were performed for complete evaluation of the gestation as well as the maternal uterus, adnexal regions, and pelvic cul-de-sac. Transvaginal technique was performed to assess early pregnancy. COMPARISON:  None. FINDINGS: Intrauterine gestational sac: None Maternal uterus/adnexae: Fluid or blood is seen throughout the endometrial cavity. No fibroids identified. Both ovaries are normal in appearance. No adnexal mass or abnormal free fluid identified. IMPRESSION: Pregnancy of unknown anatomic location (no intrauterine gestational sac or adnexal mass identified). Fluid or blood throughout the endometrial cavity favors a recent spontaneous abortion, however, IUP too early to visualize, and non-visualized ectopic pregnancy cannot definitely be excluded. Recommend correlation with serial beta-hCG levels, and follow up US if warranted clinically. Electronically Signed   By: Marlaine Hind M.D.   On: 06/19/2019 18:30   US OB Transvaginal  Result Date: 06/19/2019 CLINICAL DATA:  Vaginal bleeding in 1st trimester pregnancy. EXAM: OBSTETRIC <14 WK [place] AND TRANSVAGINAL OB US TECHNIQUE: Both transabdominal and transvaginal ultrasound examinations were  performed for complete evaluation of the gestation as well as the maternal uterus, adnexal regions, and pelvic cul-de-sac. Transvaginal technique was performed to assess early pregnancy. COMPARISON:  None. FINDINGS: Intrauterine gestational sac: None Maternal uterus/adnexae: Fluid or blood is seen throughout the endometrial cavity. No fibroids identified. Both ovaries are normal in appearance. No adnexal mass or abnormal free fluid identified. IMPRESSION: Pregnancy of unknown anatomic location (no intrauterine gestational  sac or adnexal mass identified). Fluid or blood throughout the endometrial cavity favors a recent spontaneous abortion, however, IUP too early to visualize, and non-visualized ectopic pregnancy cannot definitely be excluded. Recommend correlation with serial beta-hCG levels, and follow up US if warranted clinically. Electronically Signed   By: Marlaine Hind M.D.   On: 06/19/2019 18:30    Pending Labs Unresulted Labs (From admission, onward)    Start     Ordered   06/19/19 2304  HIV Antibody (routine testing w rflx)  (HIV Antibody (Routine testing w reflex) panel)  Once,   STAT     06/19/19 2303   06/19/19 2304  Magnesium  Once,   STAT     06/19/19 2303   06/19/19 2304  Phosphorus  Once,   STAT     06/19/19 2303   06/19/19 2304  TSH  Once,   STAT     06/19/19 2303   06/19/19 2304  Lactic acid, plasma  STAT Now then every 3 hours,   R     06/19/19 2303   06/19/19 2232  SARS CORONAVIRUS 2 (TAT 6-24 HRS) Nasopharyngeal Nasopharyngeal Swab  (Tier 3 (TAT 6-24 hrs))  Once,   STAT    Question Answer Comment  Is this test for diagnosis or screening Screening   Symptomatic for COVID-19 as defined by CDC No   Hospitalized for COVID-19 No   Admitted to ICU for COVID-19 No   Previously tested for COVID-19 No   Resident in a congregate (group) care setting No   Employed in healthcare setting No   Pregnant Yes      06/19/19 2231   06/19/19 1800  hCG, serum, qualitative  Once,   STAT      06/19/19 1800   06/19/19 1547  Urine rapid drug screen (hosp performed)not at [place],   STAT     06/19/19 1546          Vitals/Pain Today's Vitals   06/19/19 1928 06/19/19 2015 06/19/19 2142 06/19/19 2222  BP: (!) 200/140 (!) 175/118 (!) 173/118 (!) 163/103  Pulse: 80  77 81  Resp: 16 15 19 18   Temp:      TempSrc:      SpO2: 98%  97% 98%  Weight:      Height:      PainSc:        Isolation Precautions No active isolations  Medications Medications  labetalol (NORMODYNE) injection 10 mg (has no administration in time range)  sodium chloride 0.9 % bolus 1,000 mL (0 mLs Intravenous Stopped 06/19/19 1956)  labetalol (NORMODYNE) injection 20 mg (20 mg Intravenous Given 06/19/19 1730)  labetalol (NORMODYNE) injection 40 mg (20 mg Intravenous Given 06/19/19 2159)    Mobility walks Low fall risk   Focused Assessments Cardiac Assessment Handoff:    No results found for: CKTOTAL, CKMB, CKMBINDEX, TROPONINI No results found for: DDIMER Does the Patient currently have chest pain? No      R Recommendations: See Admitting Provider Note  Report given to:   Additional Notes: Going through a miscarriage

## 2019-06-19 NOTE — ED Notes (Signed)
Patient transported to CT 

## 2019-06-19 NOTE — ED Notes (Addendum)
MD made aware of pt BP after receiving Labetalol.

## 2019-06-19 NOTE — ED Triage Notes (Signed)
Pt to ED via EMS from home. Per family, heard a thump from bathroom and pt was leaning over toilet, foaming at mouth and not responding to family. No known medical hx, C/O nausea and vomiting that started this morning, Pt took home preg test this morning and had positive result.  ,  Per EMS pt was found in the bathroom on the floor, family placed pt on floor , Marijuana noted at seen. pt was in and out of alertness, which has improved since first contact. #20 L hand, no medications given. Last VS: 146/100, hr92, rr 16, 99%RA, CBG 146

## 2019-06-19 NOTE — ED Notes (Signed)
Pt able to ambulate to restroom without assistance. No difficulties reported.

## 2019-06-19 NOTE — ED Provider Notes (Signed)
[facility] Provider Note   CSN: [id number] Arrival date & time: 06/19/19  1533     History Chief Complaint  Patient presents with  . Altered Mental Status    Phyllis Hopkins is a 43 y.o. female.  Patient presents from home after suspected syncopal episode in the bathroom.  Patient was found leaning over a toilet foaming at the mouth and not responding to family.  Remembers going into the bathroom because she was having lower abdominal cramping and some vaginal spotting.  Reports having a positive pregnancy test several days ago.  Her last menstrual cycle was 1 month ago.  She has never been pregnant previously.  States she has intermittent spotting today about 2 or 3 tampons worth but it stopped.  She has some cramping while she was in the bathroom.  Denies any preceding chest pain or shortness of breath.  Did have some dizziness and lightheadedness.  1 episode of vomiting this morning but none since.  Admits to using marijuana today which she does on a regular basis.  No history of seizures.  There is no tongue biting or incontinence.  She is alert and oriented to person and place on arrival.  Denies any pain.  Denies taking anything other than marijuana.  States her abdominal cramping and vaginal bleeding have resolved.  There is no fever, chest pain, shortness of breath, headache or visual change.  Patient states she has not taken her lisinopril for several months and has not had her blood pressure medications since that time  The history is provided by the patient and the EMS personnel.  Altered Mental Status Associated symptoms: abdominal pain, light-headedness, nausea, vomiting and weakness   Associated symptoms: no fever and no rash        Past Medical History:  Diagnosis Date  . Blood in stool    hx of  . Heart murmur   . Hypertension     Patient Active Problem List   Diagnosis Date Noted  . Left arm pain 05/19/2017  . HTN  (hypertension) 09/30/2010  . COLITIS, HX OF 08/28/2010    No past surgical history on file.   OB History    Gravida  1   Para      Term      Preterm      AB      Living        SAB      TAB      Ectopic      Multiple      Live Births              Family History  Problem Relation Age of Onset  . Hypertension Mother   . Hypertension Father   . Diabetes Father        diet controlled    Social History   Tobacco Use  . Smoking status: Never Smoker  . Smokeless tobacco: Never Used  Substance Use Topics  . Alcohol use: No  . Drug use: No    Home Medications Prior to Admission medications   Medication Sig Start Date End Date Taking? Authorizing Provider  amLODipine (NORVASC) 5 MG tablet Take 1 tablet (5 mg total) by mouth daily. 05/17/17   Sherwood Gambler, MD  cephALEXin (KEFLEX) 500 MG capsule Take 1 capsule (500 mg total) by mouth 4 (four) times daily. 05/25/17   Dene Gentry, MD  diclofenac (VOLTAREN) 75 MG EC tablet Take 1 tablet (75 mg  total) by mouth 2 (two) times daily. 05/18/17   Hudnall, Sharyn Lull, MD  HYDROcodone-acetaminophen (NORCO) 5-325 MG tablet Take 1 tablet by mouth every 6 (six) hours as needed for moderate pain. 05/18/17   Hudnall, Sharyn Lull, MD  labetalol (NORMODYNE) 100 MG tablet Take 2 tablets (200 mg total) by mouth 2 (two) times daily. 05/17/17   Sherwood Gambler, MD  naproxen (NAPROSYN) 500 MG tablet Take 1 tablet (500 mg total) by mouth 2 (two) times daily with a meal. 05/17/17   Sherwood Gambler, MD    Allergies    Patient has no known allergies.  Review of Systems   Review of Systems  Constitutional: Negative for activity change, appetite change, fatigue and fever.  HENT: Negative for congestion and rhinorrhea.   Eyes: Negative for visual disturbance.  Respiratory: Negative for chest tightness and shortness of breath.   Cardiovascular: Negative for chest pain.  Gastrointestinal: Positive for abdominal pain, nausea and vomiting.   Genitourinary: Positive for vaginal bleeding. Negative for dysuria and hematuria.  Musculoskeletal: Negative for arthralgias and myalgias.  Skin: Negative for rash.  Neurological: Positive for weakness and light-headedness.    all other systems are negative except as noted in the HPI and PMH.   Physical Exam Updated Vital Signs BP (!) 139/108   Pulse 89   Temp 98.8 F (37.1 C) (Oral)   Resp 20   Ht 5\' 2"  (1.575 m)   Wt 72.6 kg   SpO2 98%   BMI 29.26 kg/m   Physical Exam Vitals and nursing note reviewed.  Constitutional:      General: She is not in acute distress.    Appearance: She is well-developed.  HENT:     Head: Normocephalic and atraumatic.     Mouth/Throat:     Pharynx: No oropharyngeal exudate.     Comments: No tongue trauma Eyes:     Conjunctiva/sclera: Conjunctivae normal.     Pupils: Pupils are equal, round, and reactive to light.  Neck:     Comments: No meningismus. Cardiovascular:     Rate and Rhythm: Normal rate and regular rhythm.     Heart sounds: Normal heart sounds. No murmur.  Pulmonary:     Effort: Pulmonary effort is normal. No respiratory distress.     Breath sounds: Normal breath sounds.  Abdominal:     Palpations: Abdomen is soft.     Tenderness: There is no abdominal tenderness. There is no guarding or rebound.  Musculoskeletal:        General: No tenderness. Normal range of motion.     Cervical back: Normal range of motion and neck supple.  Skin:    General: Skin is warm.  Neurological:     General: No focal deficit present.     Mental Status: She is alert. Mental status is at baseline.     Cranial Nerves: No cranial nerve deficit.     Motor: No abnormal muscle tone.     Coordination: Coordination normal.     Comments: Oriented to person and place, disoriented to time.  Follows commands appropriately, cranial nerves II to XII intact, 5/5 strength throughout No ataxia on finger to nose, no pronator drift  Psychiatric:         Behavior: Behavior normal.     ED Results / Procedures / Treatments   Labs (all labs ordered are listed, but only abnormal results are displayed) Labs Reviewed  CBC WITH DIFFERENTIAL/PLATELET - Abnormal; Notable for the following components:  Result Value   WBC 12.0 (*)    Platelets 641 (*)    Neutro Abs 10.3 (*)    All other components within normal limits  COMPREHENSIVE METABOLIC PANEL - Abnormal; Notable for the following components:   Sodium 132 (*)    CO2 17 (*)    Glucose, Bld 197 (*)    Creatinine, Ser 1.14 (*)    GFR calc non Af Amer 58 (*)    All other components within normal limits  ACETAMINOPHEN LEVEL - Abnormal; Notable for the following components:   Acetaminophen (Tylenol), Serum <10 (*)    All other components within normal limits  SALICYLATE LEVEL - Abnormal; Notable for the following components:   Salicylate Lvl Q000111Q (*)    All other components within normal limits  HCG, QUANTITATIVE, PREGNANCY - Abnormal; Notable for the following components:   hCG, Beta Chain, Quant, S 5 (*)    All other components within normal limits  CBG MONITORING, ED - Abnormal; Notable for the following components:   Glucose-Capillary 184 (*)    All other components within normal limits  I-STAT BETA HCG BLOOD, ED ([facility], [facility], [facility] ONLY) - Abnormal; Notable for the following components:   I-stat hCG, quantitative 5.8 (*)    All other components within normal limits  ETHANOL  URINALYSIS, ROUTINE W REFLEX MICROSCOPIC  RAPID URINE DRUG SCREEN, HOSP PERFORMED  HCG, SERUM, QUALITATIVE  ABO/RH  TROPONIN I (HIGH SENSITIVITY)  TROPONIN I (HIGH SENSITIVITY)    EKG EKG Interpretation  Date/Time:  Tuesday June 19 2019 15:43:08 EST Ventricular Rate:  90 PR Interval:    QRS Duration: 88 QT Interval:  370 QTC Calculation: 453 R Axis:   34 Text Interpretation: Sinus rhythm Abnormal R-wave progression, early transition No previous ECGs available Confirmed by Ezequiel Essex [phone])  on 06/19/2019 3:56:34 PM   Radiology CT Head Wo Contrast  Result Date: 06/19/2019 CLINICAL DATA:  Encephalopathy.  Foaming at the mouth. EXAM: CT HEAD WITHOUT CONTRAST TECHNIQUE: Contiguous axial images were obtained from the base of the skull through the vertex without intravenous contrast. COMPARISON:  None. FINDINGS: Brain: No evidence of acute infarction, hemorrhage, hydrocephalus, extra-axial collection or mass lesion/mass effect. Vascular: No hyperdense vessel or unexpected calcification. Skull: No osseous abnormality. Sinuses/Orbits: Visualized paranasal sinuses are clear. Visualized mastoid sinuses are clear. Visualized orbits demonstrate no focal abnormality. Other: None IMPRESSION: No acute intracranial pathology. Electronically Signed   By: Kathreen Devoid   On: 06/19/2019 20:02   US OB Comp Less 14 Wks  Result Date: 06/19/2019 CLINICAL DATA:  Vaginal bleeding in 1st trimester pregnancy. EXAM: OBSTETRIC <14 WK [place] AND TRANSVAGINAL OB US TECHNIQUE: Both transabdominal and transvaginal ultrasound examinations were performed for complete evaluation of the gestation as well as the maternal uterus, adnexal regions, and pelvic cul-de-sac. Transvaginal technique was performed to assess early pregnancy. COMPARISON:  None. FINDINGS: Intrauterine gestational sac: None Maternal uterus/adnexae: Fluid or blood is seen throughout the endometrial cavity. No fibroids identified. Both ovaries are normal in appearance. No adnexal mass or abnormal free fluid identified. IMPRESSION: Pregnancy of unknown anatomic location (no intrauterine gestational sac or adnexal mass identified). Fluid or blood throughout the endometrial cavity favors a recent spontaneous abortion, however, IUP too early to visualize, and non-visualized ectopic pregnancy cannot definitely be excluded. Recommend correlation with serial beta-hCG levels, and follow up US if warranted clinically. Electronically Signed   By: Marlaine Hind M.D.   On:  06/19/2019 18:30   US OB Transvaginal  Result Date:  06/19/2019 CLINICAL DATA:  Vaginal bleeding in 1st trimester pregnancy. EXAM: OBSTETRIC <14 WK [place] AND TRANSVAGINAL OB US TECHNIQUE: Both transabdominal and transvaginal ultrasound examinations were performed for complete evaluation of the gestation as well as the maternal uterus, adnexal regions, and pelvic cul-de-sac. Transvaginal technique was performed to assess early pregnancy. COMPARISON:  None. FINDINGS: Intrauterine gestational sac: None Maternal uterus/adnexae: Fluid or blood is seen throughout the endometrial cavity. No fibroids identified. Both ovaries are normal in appearance. No adnexal mass or abnormal free fluid identified. IMPRESSION: Pregnancy of unknown anatomic location (no intrauterine gestational sac or adnexal mass identified). Fluid or blood throughout the endometrial cavity favors a recent spontaneous abortion, however, IUP too early to visualize, and non-visualized ectopic pregnancy cannot definitely be excluded. Recommend correlation with serial beta-hCG levels, and follow up US if warranted clinically. Electronically Signed   By: Marlaine Hind M.D.   On: 06/19/2019 18:30    Procedures .Critical Care Performed by: Ezequiel Essex, MD Authorized by: Ezequiel Essex, MD   Critical care provider statement:    Critical care time (minutes):  35   Critical care was time spent personally by me on the following activities:  Discussions with consultants, evaluation of patient's response to treatment, examination of patient, ordering and performing treatments and interventions, ordering and review of laboratory studies, ordering and review of radiographic studies, pulse oximetry, re-evaluation of patient's condition, obtaining history from patient or surrogate and review of old charts   (including critical care time)  Medications Ordered in ED Medications - No data to display  ED Course  I have reviewed the triage vital signs  and the nursing notes.  Pertinent labs & imaging results that were available during my care of the patient were reviewed by me and considered in my medical decision making (see chart for details).    MDM Rules/Calculators/A&P                      Patient with syncope versus seizure while in the bathroom.  The no seizure activity documented.  There was some initial confusion which is improving.  No tongue biting or incontinence.  Reported to have positive pregnancy test at home.  I-STAT hCG is 5.8.  Quant is 5.0.  Doubt that patient is actually pregnant.  Her blood pressure is uncontrolled and she has been off her medication for several months.  Discussed with patient that this does not completely rule out pregnancy though it seems unlikely at this time.  Orthostatics are positive by heart rate and she is given IV fluids  Ultrasound shows no free fluid.  Pregnancy of undetermined location.  There is blood in the endometrial canal which favors a recent tenuous abortion.  Discussed with Dr. Kennon Rounds of gynecology who reviewed images.  She agrees there is no free fluid with low suspicion for ectopic pregnancy at this time.  She recommends repeat hCG in 2 days in the office.  She states with a low hCG of 5 it is unclear whether patient was actually pregnant in the first place.  No evidence of ectopic pregnancy at this time.   CT head is negative.  Patient given IV labetalol for her elevated blood pressure.  Does have mild troponin elevation of 20.  Denies chest pain or shortness of breath.  Suspect her elevated troponin is likely due to her hypertensive urgency.  She is given additional doses of IV labetalol.  Given her loss of consciousness with uncontrolled blood pressure and elevated troponin,  she would benefit from admission. Do not suspect ectopic pregnancy as above but will need repeat hCG in 2 days.  Discussed with patient.  Admission discussed with Dr. Marthenia Rolling. Final Clinical Impression(s)  / ED Diagnoses Final diagnoses:  Syncope, unspecified syncope type  Elevated troponin  Hypertensive urgency  Spontaneous abortion    Rx / DC Orders ED Discharge Orders    None       Rahima Fleishman, Annie Main, MD 06/19/19 2345

## 2019-06-19 NOTE — ED Notes (Signed)
Patient's IV in the left hand was painful when flushed. There were signs of infiltration present. The IV was removed and on another attempt, blood was drawn. The IV infiltrated upon flushing and was removed.

## 2019-06-19 NOTE — H&P (Signed)
History and Physical  Phyllis Hopkins [id number] DOB: 08/11/1975 DOA: 06/19/2019  Referring physician: ER provider PCP: Debbrah Alar, NP  Outpatient Specialists:    Patient coming from: Home  Chief Complaint: Abdominal cramping and vaginal bleeding  HPI:  Patient is a 43 year old African-American female with past medical history significant for hypertension.  Apparently, patient has not taken antihypertensives in several months.  Patient developed abdominal cramping earlier today with associated vaginal bleeding.  Last normal menstrual period was around 05 May 2019.  Patient is sexually active, and not on any contraceptives.  Patient reported seen lumpy blood per vaginal.  hCG is positive.  Vaginal ultrasound is worrisome for possible spontaneous abortion versus ectopic pregnancy.  ER provider has been comanaging the vaginal bleeding with the obstetrics and gynecology team.  Hospitalist team was asked to admit patient because patient's initial blood pressure was 201/124 mmHg, with minimally elevated troponin.  Patient also reported that someone found her with upward rolling of the eyes, foaming of the mouth and some tongue biting.  No incontinence.  No prior history of seizures.  No headache, no neck pain, no chest pain, no shortness of breath, no GI symptoms urinary symptoms.  ER provider has managed the accelerated hypertension/hypertensive urgency with intermittent labetalol.  ED Course: On presentation to the ER, temperature was 98.8, blood pressure has ranged from 139-201/108-124 mmHg, heart rate of 51, respiratory rate of 18 and O2 sat of 98%.  CT head is nonrevealing.  Obstetrics ultrasound revealed "Pregnancy of unknown anatomic location (no intrauterine gestational sac or adnexal mass identified). Fluid or blood throughout the endometrial cavity favors a recent spontaneous abortion, however, IUP too early to visualize, and non-visualized ectopic pregnancy cannot  definitely be excluded. Recommend correlation with serial beta-hCG levels, and follow up US if warranted clinically".  Blood pressure has been gradually controlled with intermittent IV labetalol.  Current systolic blood pressures above 160 mmHg.  Pertinent labs: From creatinine is 1.14, blood sugar of 197 and sodium of 133.  Troponin has gone from 12-09-11.  CBC reveals WBC of 12, hemoglobin of 13.3, hematocrit of 40.3, MCV of 90.4 with platelet count of 641.  Absolute neutrophil count is 10.3.  Beta-hCG is 5.    EKG: Independently reviewed.   Imaging: independently reviewed.   Review of Systems:  Negative for fever, visual changes, sore throat, rash, new muscle aches, chest pain, SOB, dysuria.  Past Medical History:  Diagnosis Date  . Blood in stool    hx of  . Heart murmur   . Hypertension     No past surgical history on file.   reports that she has never smoked. She has never used smokeless tobacco. She reports that she does not drink alcohol or use drugs.  No Known Allergies  Family History  Problem Relation Age of Onset  . Hypertension Mother   . Hypertension Father   . Diabetes Father        diet controlled     Prior to Admission medications   Medication Sig Start Date End Date Taking? Authorizing Provider  amLODipine (NORVASC) 5 MG tablet Take 1 tablet (5 mg total) by mouth daily. 05/17/17   Sherwood Gambler, MD  cephALEXin (KEFLEX) 500 MG capsule Take 1 capsule (500 mg total) by mouth 4 (four) times daily. 05/25/17   Hudnall, Sharyn Lull, MD  diclofenac (VOLTAREN) 75 MG EC tablet Take 1 tablet (75 mg total) by mouth 2 (two) times daily. 05/18/17   Dene Gentry, MD  HYDROcodone-acetaminophen (  NORCO) 5-325 MG tablet Take 1 tablet by mouth every 6 (six) hours as needed for moderate pain. 05/18/17   Hudnall, Sharyn Lull, MD  labetalol (NORMODYNE) 100 MG tablet Take 2 tablets (200 mg total) by mouth 2 (two) times daily. 05/17/17   Sherwood Gambler, MD  naproxen (NAPROSYN) 500 MG  tablet Take 1 tablet (500 mg total) by mouth 2 (two) times daily with a meal. 05/17/17   Sherwood Gambler, MD    Physical Exam: Vitals:   06/19/19 1928 06/19/19 2015 06/19/19 2142 06/19/19 2222  BP: (!) 200/140 (!) 175/118 (!) 173/118 (!) 163/103  Pulse: 80  77 81  Resp: 16 15 19 18   Temp:      TempSrc:      SpO2: 98%  97% 98%  Weight:      Height:        Constitutional:  . Appears calm and comfortable Eyes:  . No pallor. No jaundice.  ENMT:  . external ears, nose appear normal Neck:  . Neck is supple. No JVD Respiratory:  . CTA bilaterally, no w/r/r.  . Respiratory effort normal. No retractions or accessory muscle use Cardiovascular:  . S1S2 . No LE extremity edema   Abdomen:  . Abdomen is soft and non tender.  No organomegaly.   Neurologic:  . Awake and alert. . Moves all limbs.  Wt Readings from Last 3 Encounters:  06/19/19 72.6 kg  06/08/17 73.9 kg  05/25/17 74.8 kg    I have personally reviewed following labs and imaging studies  Labs on Admission:  CBC: Recent Labs  Lab 06/19/19 1546  WBC 12.0*  NEUTROABS 10.3*  HGB 13.3  HCT 40.3  MCV 90.4  PLT [id number]*   Basic Metabolic Panel: Recent Labs  Lab 06/19/19 1546  NA 132*  K 4.1  CL 101  CO2 17*  GLUCOSE 197*  BUN 7  CREATININE 1.14*  CALCIUM 9.1   Liver Function Tests: Recent Labs  Lab 06/19/19 1546  AST 21  ALT 12  ALKPHOS 54  BILITOT 0.8  PROT 7.9  ALBUMIN 3.9   No results for input(s): LIPASE, AMYLASE in the last 168 hours. No results for input(s): AMMONIA in the last 168 hours. Coagulation Profile: No results for input(s): INR, PROTIME in the last 168 hours. Cardiac Enzymes: No results for input(s): CKTOTAL, CKMB, CKMBINDEX, TROPONINI in the last 168 hours. BNP (last 3 results) No results for input(s): PROBNP in the last 8760 hours. HbA1C: No results for input(s): HGBA1C in the last 72 hours. CBG: Recent Labs  Lab 06/19/19 1540  GLUCAP 184*   Lipid Profile: No results  for input(s): CHOL, HDL, LDLCALC, TRIG, CHOLHDL, LDLDIRECT in the last 72 hours. Thyroid Function Tests: No results for input(s): TSH, T4TOTAL, FREET4, T3FREE, THYROIDAB in the last 72 hours. Anemia Panel: No results for input(s): VITAMINB12, FOLATE, FERRITIN, TIBC, IRON, RETICCTPCT in the last 72 hours. Urine analysis:    Component Value Date/Time   COLORURINE YELLOW 06/19/2019 1740   APPEARANCEUR CLOUDY (A) 06/19/2019 1740   LABSPEC 1.010 06/19/2019 1740   PHURINE 6.0 06/19/2019 1740   GLUCOSEU NEGATIVE 06/19/2019 1740   HGBUR MODERATE (A) 06/19/2019 1740   BILIRUBINUR NEGATIVE 06/19/2019 1740   KETONESUR 5 (A) 06/19/2019 1740   PROTEINUR 100 (A) 06/19/2019 1740   NITRITE NEGATIVE 06/19/2019 1740   LEUKOCYTESUR MODERATE (A) 06/19/2019 1740   Sepsis Labs: @LABRCNTIP (procalcitonin:4,lacticidven:4) )No results found for this or any previous visit (from the past 240 hour(s)).    Radiological Exams  on Admission: CT Head Wo Contrast  Result Date: 06/19/2019 CLINICAL DATA:  Encephalopathy.  Foaming at the mouth. EXAM: CT HEAD WITHOUT CONTRAST TECHNIQUE: Contiguous axial images were obtained from the base of the skull through the vertex without intravenous contrast. COMPARISON:  None. FINDINGS: Brain: No evidence of acute infarction, hemorrhage, hydrocephalus, extra-axial collection or mass lesion/mass effect. Vascular: No hyperdense vessel or unexpected calcification. Skull: No osseous abnormality. Sinuses/Orbits: Visualized paranasal sinuses are clear. Visualized mastoid sinuses are clear. Visualized orbits demonstrate no focal abnormality. Other: None IMPRESSION: No acute intracranial pathology. Electronically Signed   By: Kathreen Devoid   On: 06/19/2019 20:02   US OB Comp Less 14 Wks  Result Date: 06/19/2019 CLINICAL DATA:  Vaginal bleeding in 1st trimester pregnancy. EXAM: OBSTETRIC <14 WK [place] AND TRANSVAGINAL OB US TECHNIQUE: Both transabdominal and transvaginal ultrasound examinations  were performed for complete evaluation of the gestation as well as the maternal uterus, adnexal regions, and pelvic cul-de-sac. Transvaginal technique was performed to assess early pregnancy. COMPARISON:  None. FINDINGS: Intrauterine gestational sac: None Maternal uterus/adnexae: Fluid or blood is seen throughout the endometrial cavity. No fibroids identified. Both ovaries are normal in appearance. No adnexal mass or abnormal free fluid identified. IMPRESSION: Pregnancy of unknown anatomic location (no intrauterine gestational sac or adnexal mass identified). Fluid or blood throughout the endometrial cavity favors a recent spontaneous abortion, however, IUP too early to visualize, and non-visualized ectopic pregnancy cannot definitely be excluded. Recommend correlation with serial beta-hCG levels, and follow up US if warranted clinically. Electronically Signed   By: Marlaine Hind M.D.   On: 06/19/2019 18:30   US OB Transvaginal  Result Date: 06/19/2019 CLINICAL DATA:  Vaginal bleeding in 1st trimester pregnancy. EXAM: OBSTETRIC <14 WK [place] AND TRANSVAGINAL OB US TECHNIQUE: Both transabdominal and transvaginal ultrasound examinations were performed for complete evaluation of the gestation as well as the maternal uterus, adnexal regions, and pelvic cul-de-sac. Transvaginal technique was performed to assess early pregnancy. COMPARISON:  None. FINDINGS: Intrauterine gestational sac: None Maternal uterus/adnexae: Fluid or blood is seen throughout the endometrial cavity. No fibroids identified. Both ovaries are normal in appearance. No adnexal mass or abnormal free fluid identified. IMPRESSION: Pregnancy of unknown anatomic location (no intrauterine gestational sac or adnexal mass identified). Fluid or blood throughout the endometrial cavity favors a recent spontaneous abortion, however, IUP too early to visualize, and non-visualized ectopic pregnancy cannot definitely be excluded. Recommend correlation with serial  beta-hCG levels, and follow up US if warranted clinically. Electronically Signed   By: Marlaine Hind M.D.   On: 06/19/2019 18:30    EKG: Independently reviewed.   Active Problems:   Hypertensive urgency   Assessment/Plan Vaginal bleeding and abdominal cramping, worrisome for spontaneous abortion: -OB/GYN team is managing. (Defer this to the OB/GYN team)  Hypertensive urgency: Patient has been off antihypertensive for several months. Avoid excessive drop in blood pressure. Keep systolic blood pressure greater than 160 to 170 mmHg for the next 24 hours. Continue to use IV labetalol as needed for systolic blood pressure greater than 170 mmHg. Further management depend on hospital course  Possible seizure: No prior history of seizure Check lactic acid Seizure precaution Monitor patient in the hospital for now  Noncompliance: Counseled patient.  DVT prophylaxis: SCD Code Status: Full Family Communication:  Disposition Plan: This will depend on hospital course Consults called:ER provider has discussed with OB/GYN Admission status: Inpatient  Time spent: 65 minutes  Dana Allan, MD  [facility] Pager #: [phone] 7PM-7AM  contact night coverage as above  06/19/2019, 10:50 PM

## 2019-06-19 NOTE — ED Notes (Signed)
Report received from Ashley, RN 

## 2019-06-19 NOTE — ED Notes (Signed)
Patient transported to Ultrasound 

## 2019-06-20 ENCOUNTER — Inpatient Hospital Stay (HOSPITAL_COMMUNITY): Payer: Federal, State, Local not specified - PPO

## 2019-06-20 ENCOUNTER — Encounter (HOSPITAL_COMMUNITY): Payer: Self-pay | Admitting: Internal Medicine

## 2019-06-20 DIAGNOSIS — R55 Syncope and collapse: Secondary | ICD-10-CM | POA: Diagnosis not present

## 2019-06-20 DIAGNOSIS — I6389 Other cerebral infarction: Secondary | ICD-10-CM

## 2019-06-20 DIAGNOSIS — I160 Hypertensive urgency: Secondary | ICD-10-CM | POA: Diagnosis not present

## 2019-06-20 DIAGNOSIS — I639 Cerebral infarction, unspecified: Secondary | ICD-10-CM

## 2019-06-20 DIAGNOSIS — O4690 Antepartum hemorrhage, unspecified, unspecified trimester: Secondary | ICD-10-CM | POA: Diagnosis not present

## 2019-06-20 DIAGNOSIS — M25511 Pain in right shoulder: Secondary | ICD-10-CM | POA: Diagnosis not present

## 2019-06-20 HISTORY — DX: Cerebral infarction, unspecified: I63.9

## 2019-06-20 LAB — CBC WITH DIFFERENTIAL/PLATELET
Abs Immature Granulocytes: 0.03 10*3/uL (ref 0.00–0.07)
Basophils Absolute: 0 10*3/uL (ref 0.0–0.1)
Basophils Relative: 0 %
Eosinophils Absolute: 0 10*3/uL (ref 0.0–0.5)
Eosinophils Relative: 0 %
HCT: 34.9 % — ABNORMAL LOW (ref 36.0–46.0)
Hemoglobin: 12 g/dL (ref 12.0–15.0)
Immature Granulocytes: 0 %
Lymphocytes Relative: 24 %
Lymphs Abs: 2 10*3/uL (ref 0.7–4.0)
MCH: 30.4 pg (ref 26.0–34.0)
MCHC: 34.4 g/dL (ref 30.0–36.0)
MCV: 88.4 fL (ref 80.0–100.0)
Monocytes Absolute: 1 10*3/uL (ref 0.1–1.0)
Monocytes Relative: 12 %
Neutro Abs: 5.1 10*3/uL (ref 1.7–7.7)
Neutrophils Relative %: 64 %
Platelets: 522 10*3/uL — ABNORMAL HIGH (ref 150–400)
RBC: 3.95 MIL/uL (ref 3.87–5.11)
RDW: 14.5 % (ref 11.5–15.5)
WBC: 8.2 10*3/uL (ref 4.0–10.5)
nRBC: 0 % (ref 0.0–0.2)

## 2019-06-20 LAB — ECHOCARDIOGRAM COMPLETE
Height: 62 in
Weight: 2491.2 oz

## 2019-06-20 LAB — MRSA PCR SCREENING: MRSA by PCR: NEGATIVE

## 2019-06-20 LAB — TSH: TSH: 2.504 u[IU]/mL (ref 0.350–4.500)

## 2019-06-20 LAB — LACTIC ACID, PLASMA
Lactic Acid, Venous: 0.8 mmol/L (ref 0.5–1.9)
Lactic Acid, Venous: 0.8 mmol/L (ref 0.5–1.9)

## 2019-06-20 LAB — MAGNESIUM: Magnesium: 2 mg/dL (ref 1.7–2.4)

## 2019-06-20 LAB — HCG, SERUM, QUALITATIVE: Preg, Serum: NEGATIVE

## 2019-06-20 LAB — HEMOGLOBIN AND HEMATOCRIT, BLOOD
HCT: 36.5 % (ref 36.0–46.0)
Hemoglobin: 12.1 g/dL (ref 12.0–15.0)

## 2019-06-20 LAB — HIV ANTIBODY (ROUTINE TESTING W REFLEX): HIV Screen 4th Generation wRfx: NONREACTIVE

## 2019-06-20 LAB — GLUCOSE, CAPILLARY: Glucose-Capillary: 101 mg/dL — ABNORMAL HIGH (ref 70–99)

## 2019-06-20 LAB — PHOSPHORUS: Phosphorus: 3.1 mg/dL (ref 2.5–4.6)

## 2019-06-20 LAB — SARS CORONAVIRUS 2 (TAT 6-24 HRS): SARS Coronavirus 2: NEGATIVE

## 2019-06-20 MED ORDER — PANTOPRAZOLE SODIUM 40 MG IV SOLR
40.0000 mg | Freq: Every day | INTRAVENOUS | Status: DC
  Administered 2019-06-20: 40 mg via INTRAVENOUS
  Filled 2019-06-20: qty 40

## 2019-06-20 MED ORDER — LABETALOL HCL 200 MG PO TABS: 200.0000 mg | ORAL_TABLET | Freq: Two times a day (BID) | ORAL | 2 refills | Status: DC

## 2019-06-20 MED ORDER — STROKE: EARLY STAGES OF RECOVERY BOOK
Freq: Once | Status: AC
  Filled 2019-06-20: qty 1

## 2019-06-20 MED ORDER — SENNOSIDES-DOCUSATE SODIUM 8.6-50 MG PO TABS: 1.0000 | ORAL_TABLET | Freq: Every evening | ORAL | Status: DC | PRN

## 2019-06-20 MED ORDER — ACETAMINOPHEN 160 MG/5ML PO SOLN: 650.0000 mg | ORAL | Status: DC | PRN

## 2019-06-20 MED ORDER — ACETAMINOPHEN 325 MG PO TABS
650.0000 mg | ORAL_TABLET | ORAL | Status: DC | PRN
  Administered 2019-06-22 – 2019-06-23 (×2): 650 mg via ORAL
  Filled 2019-06-20: qty 2

## 2019-06-20 MED ORDER — IOHEXOL 350 MG/ML SOLN
100.0000 mL | Freq: Once | INTRAVENOUS | Status: AC | PRN
  Administered 2019-06-20: 100 mL via INTRAVENOUS

## 2019-06-20 MED ORDER — CLEVIDIPINE BUTYRATE 0.5 MG/ML IV EMUL: 0.0000 mg/h | INTRAVENOUS | Status: DC

## 2019-06-20 MED ORDER — ALTEPLASE (STROKE) FULL DOSE INFUSION
0.9000 mg/kg | Freq: Once | INTRAVENOUS | Status: AC
  Administered 2019-06-20: 63.5 mg via INTRAVENOUS
  Filled 2019-06-20: qty 100

## 2019-06-20 MED ORDER — SODIUM CHLORIDE 0.9 % IV SOLN
50.0000 mL | Freq: Once | INTRAVENOUS | Status: AC
  Administered 2019-06-20: 50 mL via INTRAVENOUS

## 2019-06-20 MED ORDER — LABETALOL HCL 200 MG PO TABS
200.0000 mg | ORAL_TABLET | Freq: Two times a day (BID) | ORAL | Status: DC
  Administered 2019-06-20 – 2019-06-24 (×10): 200 mg via ORAL
  Filled 2019-06-20: qty 1
  Filled 2019-06-20: qty 2
  Filled 2019-06-20 (×3): qty 1
  Filled 2019-06-20: qty 2
  Filled 2019-06-20 (×4): qty 1

## 2019-06-20 MED ORDER — SODIUM CHLORIDE 0.9 % IV SOLN: INTRAVENOUS | Status: DC

## 2019-06-20 MED ORDER — LABETALOL HCL 5 MG/ML IV SOLN
10.0000 mg | INTRAVENOUS | Status: DC | PRN
  Administered 2019-06-20: 10 mg via INTRAVENOUS
  Filled 2019-06-20: qty 4

## 2019-06-20 MED ORDER — ACETAMINOPHEN 650 MG RE SUPP: 650.0000 mg | RECTAL | Status: DC | PRN

## 2019-06-20 MED ORDER — CHLORHEXIDINE GLUCONATE CLOTH 2 % EX PADS
6.0000 | MEDICATED_PAD | Freq: Every day | CUTANEOUS | Status: DC
  Administered 2019-06-20: 6 via TOPICAL

## 2019-06-20 MED ORDER — HYDRALAZINE HCL 20 MG/ML IJ SOLN
10.0000 mg | INTRAMUSCULAR | Status: DC | PRN
  Administered 2019-06-20: 10 mg via INTRAVENOUS
  Filled 2019-06-20: qty 1

## 2019-06-20 NOTE — Progress Notes (Signed)
PROGRESS NOTE    Phyllis Hopkins  [id number] DOB: Apr 09, 1976 DOA: 06/19/2019 PCP: Debbrah Alar, NP    Brief Narrative:43 year old African-American female with past medical history significant for hypertension.  Apparently, patient has not taken antihypertensives in several months.  Patient developed abdominal cramping earlier today with associated vaginal bleeding.  Last normal menstrual period was around 05 May 2019.  Patient is sexually active, and not on any contraceptives.  Patient reported seen lumpy blood per vaginal.  hCG is positive.  Vaginal ultrasound is worrisome for possible spontaneous abortion versus ectopic pregnancy.  ER provider has been comanaging the vaginal bleeding with the obstetrics and gynecology team.  Hospitalist team was asked to admit patient because patient's initial blood pressure was 201/124 mmHg, with minimally elevated troponin.  Patient also reported that someone found her with upward rolling of the eyes, foaming of the mouth and some tongue biting.  No incontinence.  No prior history of seizures.  No headache, no neck pain, no chest pain, no shortness of breath, no GI symptoms urinary symptoms.  ER provider has managed the accelerated hypertension/hypertensive urgency with intermittent labetalol.  ED Course: On presentation to the ER, temperature was 98.8, blood pressure has ranged from 139-201/108-124 mmHg, heart rate of 51, respiratory rate of 18 and O2 sat of 98%.  CT head is nonrevealing.  Obstetrics ultrasound revealed "Pregnancy of unknown anatomic location (no intrauterine gestational sac or adnexal mass identified). Fluid or blood throughout the endometrial cavity favors a recent spontaneous abortion, however, IUP too early to visualize, and non-visualized ectopic pregnancy cannot definitely be excluded. Recommend correlation with serial beta-hCG levels, and follow up US if warranted clinically".  Blood pressure has been gradually  controlled with intermittent IV labetalol.  Current systolic blood pressures above 160 mmHg.  Assessment & Plan:   Active Problems:   Hypertensive urgency   Syncope   Vaginal bleeding in pregnancy   #1 possible  stroke-patient became suddenly aphasic code stroke was called patient taken down for stat CT and TPA.  [place] neurology.  #2 hypertensive urgency labetalol has been restarted  #3 question seizure per neuro  #4 possible spontaneous abortion discussed with GYN today advised to obtain serum hCG tomorrow.   Estimated body mass index is 28.48 kg/m as calculated from the following:   Height as of this encounter: 5\' 2"  (1.575 m).   Weight as of this encounter: 70.6 kg. DVT prophylaxis: SCD Code Status: Full Family Communication: none Disposition Plan transfer to neuro Consults called: Discussed with GYN   Subjective:  Patient was awake alert and talkative when I saw her early this morning.  She refused to have her EEG done as she did not want to take her hair out. When patient went down to have shoulder x-ray she became dizzy nauseous and became aphasic Prior to that she walked with the nurse in the hallway.. Objective: Vitals:   06/20/19 0000 06/20/19 0032 06/20/19 0548 06/20/19 1156  BP: (!) 172/108 (!) 163/108 (!) 145/88 (!) 157/102  Pulse: 80 71 75 78  Resp: (!) 21 16 16    Temp:  99.8 F (37.7 C) 99.3 F (37.4 C)   TempSrc:  Oral Oral   SpO2: 98% 100% 100%   Weight:   70.6 kg   Height:        Intake/Output Summary (Last 24 hours) at 06/20/2019 1402 Last data filed at 06/20/2019 1008 Gross per 24 hour  Intake 1600 ml  Output --  Net 1600 ml   Filed  Weights   06/19/19 1545 06/20/19 0548  Weight: 72.6 kg 70.6 kg    Examination:  General exam: Appears calm and comfortable  Respiratory system: Clear to auscultation. Respiratory effort normal. Cardiovascular system: S1 & S2 heard, RRR. No JVD, murmurs, rubs, gallops or clicks. No pedal  edema. Gastrointestinal system: Abdomen is nondistended, soft and nontender. No organomegaly or masses felt. Normal bowel sounds heard. Central nervous system: Alert and oriented. No focal neurological deficits. Extremities: Symmetric 5 x 5 power. Skin: No rashes, lesions or ulcers Psychiatry: Judgement and insight appear normal. Mood & affect appropriate.     Data Reviewed: I have personally reviewed following labs and imaging studies  CBC: Recent Labs  Lab 06/19/19 1546 06/20/19 1136  WBC 12.0* 8.2  NEUTROABS 10.3* 5.1  HGB 13.3 12.0  HCT 40.3 34.9*  MCV 90.4 88.4  PLT 641* [id number]*   Basic Metabolic Panel: Recent Labs  Lab 06/19/19 1546 06/20/19 0042  NA 132*  --   K 4.1  --   CL 101  --   CO2 17*  --   GLUCOSE 197*  --   BUN 7  --   CREATININE 1.14*  --   CALCIUM 9.1  --   MG  --  2.0  PHOS  --  3.1   GFR: Estimated Creatinine Clearance: 57.4 mL/min (A) (by C-G formula based on SCr of 1.14 mg/dL (H)). Liver Function Tests: Recent Labs  Lab 06/19/19 1546  AST 21  ALT 12  ALKPHOS 54  BILITOT 0.8  PROT 7.9  ALBUMIN 3.9   No results for input(s): LIPASE, AMYLASE in the last 168 hours. No results for input(s): AMMONIA in the last 168 hours. Coagulation Profile: No results for input(s): INR, PROTIME in the last 168 hours. Cardiac Enzymes: No results for input(s): CKTOTAL, CKMB, CKMBINDEX, TROPONINI in the last 168 hours. BNP (last 3 results) No results for input(s): PROBNP in the last 8760 hours. HbA1C: No results for input(s): HGBA1C in the last 72 hours. CBG: Recent Labs  Lab 06/19/19 1540  GLUCAP 184*   Lipid Profile: No results for input(s): CHOL, HDL, LDLCALC, TRIG, CHOLHDL, LDLDIRECT in the last 72 hours. Thyroid Function Tests: Recent Labs    06/20/19 0042  TSH 2.504   Anemia Panel: No results for input(s): VITAMINB12, FOLATE, FERRITIN, TIBC, IRON, RETICCTPCT in the last 72 hours. Sepsis Labs: Recent Labs  Lab 06/20/19 0042  06/20/19 0350  LATICACIDVEN 0.8 0.8    Recent Results (from the past 240 hour(s))  SARS CORONAVIRUS 2 (TAT 6-24 HRS) Nasopharyngeal Nasopharyngeal Swab     Status: None   Collection Time: 06/19/19 10:40 PM   Specimen: Nasopharyngeal Swab  Result Value Ref Range Status   SARS Coronavirus 2 NEGATIVE NEGATIVE Final    Comment: (NOTE) SARS-CoV-2 target nucleic acids are NOT DETECTED. The SARS-CoV-2 RNA is generally detectable in upper and lower respiratory specimens during the acute phase of infection. Negative results do not preclude SARS-CoV-2 infection, do not rule out co-infections with other pathogens, and should not be used as the sole basis for treatment or other patient management decisions. Negative results must be combined with clinical observations, patient history, and epidemiological information. The expected result is Negative. Fact Sheet for Patients: SugarRoll.be Fact Sheet for Healthcare Providers: https://www.woods-[employer] [employer]ishikawa.com/ This test is not yet approved or cleared by the [place] FDA and  has been authorized for detection and/or diagnosis of SARS-CoV-2 by FDA under an Emergency Use Authorization (EUA). This EUA will remain  in  effect (meaning this test can be used) for the duration of the COVID-19 declaration under Section 56 4(b)(1) of the Act, 21 U.S.C. section 360bbb-3(b)(1), unless the authorization is terminated or revoked sooner. Performed at [facility], [place] [address], [place], [place] [address]          Radiology Studies: DG Shoulder Right  Result Date: 06/20/2019 CLINICAL DATA:  Right shoulder pain EXAM: RIGHT SHOULDER - 2+ VIEW COMPARISON:  None. FINDINGS: There is no evidence of fracture or dislocation. There is no evidence of arthropathy or other focal bone abnormality. Soft tissues are unremarkable. IMPRESSION: No acute abnormality noted. Electronically Signed   By: Inez Catalina M.D.   On:  06/20/2019 13:43   CT Head Wo Contrast  Result Date: 06/19/2019 CLINICAL DATA:  Encephalopathy.  Foaming at the mouth. EXAM: CT HEAD WITHOUT CONTRAST TECHNIQUE: Contiguous axial images were obtained from the base of the skull through the vertex without intravenous contrast. COMPARISON:  None. FINDINGS: Brain: No evidence of acute infarction, hemorrhage, hydrocephalus, extra-axial collection or mass lesion/mass effect. Vascular: No hyperdense vessel or unexpected calcification. Skull: No osseous abnormality. Sinuses/Orbits: Visualized paranasal sinuses are clear. Visualized mastoid sinuses are clear. Visualized orbits demonstrate no focal abnormality. Other: None IMPRESSION: No acute intracranial pathology. Electronically Signed   By: Kathreen Devoid   On: 06/19/2019 20:02   US OB Comp Less 14 Wks  Result Date: 06/19/2019 CLINICAL DATA:  Vaginal bleeding in 1st trimester pregnancy. EXAM: OBSTETRIC <14 WK [place] AND TRANSVAGINAL OB US TECHNIQUE: Both transabdominal and transvaginal ultrasound examinations were performed for complete evaluation of the gestation as well as the maternal uterus, adnexal regions, and pelvic cul-de-sac. Transvaginal technique was performed to assess early pregnancy. COMPARISON:  None. FINDINGS: Intrauterine gestational sac: None Maternal uterus/adnexae: Fluid or blood is seen throughout the endometrial cavity. No fibroids identified. Both ovaries are normal in appearance. No adnexal mass or abnormal free fluid identified. IMPRESSION: Pregnancy of unknown anatomic location (no intrauterine gestational sac or adnexal mass identified). Fluid or blood throughout the endometrial cavity favors a recent spontaneous abortion, however, IUP too early to visualize, and non-visualized ectopic pregnancy cannot definitely be excluded. Recommend correlation with serial beta-hCG levels, and follow up US if warranted clinically. Electronically Signed   By: Marlaine Hind M.D.   On: 06/19/2019 18:30   US  OB Transvaginal  Result Date: 06/19/2019 CLINICAL DATA:  Vaginal bleeding in 1st trimester pregnancy. EXAM: OBSTETRIC <14 WK [place] AND TRANSVAGINAL OB US TECHNIQUE: Both transabdominal and transvaginal ultrasound examinations were performed for complete evaluation of the gestation as well as the maternal uterus, adnexal regions, and pelvic cul-de-sac. Transvaginal technique was performed to assess early pregnancy. COMPARISON:  None. FINDINGS: Intrauterine gestational sac: None Maternal uterus/adnexae: Fluid or blood is seen throughout the endometrial cavity. No fibroids identified. Both ovaries are normal in appearance. No adnexal mass or abnormal free fluid identified. IMPRESSION: Pregnancy of unknown anatomic location (no intrauterine gestational sac or adnexal mass identified). Fluid or blood throughout the endometrial cavity favors a recent spontaneous abortion, however, IUP too early to visualize, and non-visualized ectopic pregnancy cannot definitely be excluded. Recommend correlation with serial beta-hCG levels, and follow up US if warranted clinically. Electronically Signed   By: Marlaine Hind M.D.   On: 06/19/2019 18:30   CT HEAD CODE STROKE WO CONTRAST  Result Date: 06/20/2019 CLINICAL DATA:  Code stroke.  Stroke follow-up EXAM: CT HEAD WITHOUT CONTRAST TECHNIQUE: Contiguous axial images were obtained from the base of the skull  through the vertex without intravenous contrast. COMPARISON:  06/19/2019 FINDINGS: Brain: No evidence of acute infarction, hemorrhage, hydrocephalus, extra-axial collection or mass lesion/mass effect. Vascular: Negative for hyperdense vessel Skull: Negative Sinuses/Orbits: Negative Other: None ASPECTS ([place] Stroke Program Early CT Score) - Ganglionic level infarction (caudate, lentiform nuclei, internal capsule, insula, M1-M3 cortex): 7 - Supraganglionic infarction (M4-M6 cortex): 3 Total score (0-10 with 10 being normal): 10 IMPRESSION: 1. Negative CT head.  No change from  yesterday 2. ASPECTS is 10 Electronically Signed   By: Franchot Gallo M.D.   On: 06/20/2019 13:45        Scheduled Meds: . labetalol  200 mg Oral BID   Continuous Infusions: . alteplase     Followed by  . sodium chloride       LOS: 1 day     Georgette Shell, MD [facility]  If 7PM-7AM, please contact night-coverage [email or web address] Password [email or web address] 06/20/2019, 2:02 PM

## 2019-06-20 NOTE — Progress Notes (Signed)
Pt refusing EEG due to crochet hair needing to be removed. Spoke with Dr. Rodena Piety to let her know pt refuse.

## 2019-06-20 NOTE — Progress Notes (Signed)
Pt voided per toilet, did not observe. Pt states she changed her pad, previous pad had small amount of blood and clots. Denies pain. Pt slept slept through the night after she arrived to the floor.

## 2019-06-20 NOTE — Progress Notes (Addendum)
RN notified of patient having perfuse sweating in radiology, episode of not responding, and vomiting by transport service.    Patient assessed at 1220 (upon return from radiology) and noted to have difficulty finding words or following commands.  Code Stroke called at this time.  Rapid Response notified also.    Patient taken to CT scan.  Radiology Tech Alesia Banda provided last seen normal time of 1230 to myself and Dr. Cristina Gong, [place].    Patient taken to 4N23, Report given at bedside to [facility] on 4N.

## 2019-06-20 NOTE — Progress Notes (Signed)
Echocardiogram 2D Echocardiogram has been performed.  Oneal Deputy Mickeal Daws 06/20/2019, 3:10 PM

## 2019-06-20 NOTE — Discharge Summary (Signed)
Physician Discharge Summary  Phyllis Hopkins [id number] DOB: 10/31/1975 DOA: 06/19/2019  PCP: Debbrah Alar, NP  Admit date: 06/19/2019 Discharge date: 06/20/2019  Admitted From: Home Disposition: Home Recommendations for Outpatient Follow-up:  1. Follow up with PCP in 1-2 weeks 2. Please obtain BMP/CBC in one week Please follow up with GYN 06/21/2019 to check  serum hCG   Home Health: None Equipment/Devices: None  Discharge Condition: Stable and improved CODE STATUS: Full code  diet recommendation cardiac  Brief/Interim Summary:43 year old African-American female with past medical history significant for hypertension.  Apparently, patient has not taken antihypertensives in several months.  Patient developed abdominal cramping earlier today with associated vaginal bleeding.  Last normal menstrual period was around 05 May 2019.  Patient is sexually active, and not on any contraceptives.  Patient reported seen lumpy blood per vaginal.  hCG is positive.  Vaginal ultrasound is worrisome for possible spontaneous abortion versus ectopic pregnancy.  ER provider has been comanaging the vaginal bleeding with the obstetrics and gynecology team.  Hospitalist team was asked to admit patient because patient's initial blood pressure was 201/124 mmHg, with minimally elevated troponin.  Patient also reported that someone found her with upward rolling of the eyes, foaming of the mouth and some tongue biting.  No incontinence.  No prior history of seizures.  No headache, no neck pain, no chest pain, no shortness of breath, no GI symptoms urinary symptoms.  ER provider has managed the accelerated hypertension/hypertensive urgency with intermittent labetalol.  ED Course: On presentation to the ER, temperature was 98.8, blood pressure has ranged from 139-201/108-124 mmHg, heart rate of 51, respiratory rate of 18 and O2 sat of 98%.  CT head is nonrevealing.  Obstetrics ultrasound revealed  "Pregnancy of unknown anatomic location (no intrauterine gestational sac or adnexal mass identified). Fluid or blood throughout the endometrial cavity favors a recent spontaneous abortion, however, IUP too early to visualize, and non-visualized ectopic pregnancy cannot definitely be excluded. Recommend correlation with serial beta-hCG levels, and follow up US if warranted clinically".  Blood pressure has been gradually controlled with intermittent IV labetalol.  Current systolic blood pressures above 160 mmHg. Discharge Diagnoses:  Active Problems:   Hypertensive urgency  #1 hypertensive urgency-patient was admitted with a blood pressure of 200/124 on admission.  Patient had stopped taking her antihypertensives at home.  It looks like she was on Norvasc and labetalol and ACE inhibitor at home.  She was restarted on labetalol.  On discharge her blood pressure is 145/88.  #2 vaginal bleeding spontaneous abortion likely.  Discussed with GYN on-call Dr. Rosana Hoes.  Patient to go to GYN office tomorrow to check serum hCG.  Her pregnancy test is negative.  Quantitative hCG 5.8.  #3 there was a concern of for possible seizure at home witnessed by family when patient was jerking with eyes rolling backwards with no evidence of incontinence or tongue biting.  Patient has not had any further episodes. She refused an EEG.  CT head showed no acute intracranial pathology.  Estimated body mass index is 28.48 kg/m as calculated from the following:   Height as of this encounter: 5\' 2"  (1.575 m).   Weight as of this encounter: 70.6 kg.  Discharge Instructions  Discharge Instructions    Call MD for:  difficulty breathing, headache or visual disturbances   Complete by: As directed    Call MD for:  persistant nausea and vomiting   Complete by: As directed    Call MD for:  severe uncontrolled pain  Complete by: As directed    Diet - low sodium heart healthy   Complete by: As directed    Increase activity slowly    Complete by: As directed      Allergies as of 06/20/2019   No Known Allergies     Medication List    STOP taking these medications   amLODipine 5 MG tablet Commonly known as: NORVASC   cephALEXin 500 MG capsule Commonly known as: Keflex   diclofenac 75 MG EC tablet Commonly known as: VOLTAREN   HYDROcodone-acetaminophen 5-325 MG tablet Commonly known as: Norco   naproxen 500 MG tablet Commonly known as: NAPROSYN     TAKE these medications   labetalol 200 MG tablet Commonly known as: NORMODYNE Take 1 tablet (200 mg total) by mouth 2 (two) times daily. What changed: medication strength      Follow-up Information    Debbrah Alar, NP Follow up.   Specialty: Internal Medicine Contact information: [address] [place] [id number] [phone]        Sloan Leiter, MD Follow up.   Specialty: Obstetrics and Gynecology Why: Check serum hCG level 06/21/2019 Contact information: [place] [address] [phone]          No Known Allergies  Consultations:  Discussed with on-call GYN over the phone   Procedures/Studies: CT Head Wo Contrast  Result Date: 06/19/2019 CLINICAL DATA:  Encephalopathy.  Foaming at the mouth. EXAM: CT HEAD WITHOUT CONTRAST TECHNIQUE: Contiguous axial images were obtained from the base of the skull through the vertex without intravenous contrast. COMPARISON:  None. FINDINGS: Brain: No evidence of acute infarction, hemorrhage, hydrocephalus, extra-axial collection or mass lesion/mass effect. Vascular: No hyperdense vessel or unexpected calcification. Skull: No osseous abnormality. Sinuses/Orbits: Visualized paranasal sinuses are clear. Visualized mastoid sinuses are clear. Visualized orbits demonstrate no focal abnormality. Other: None IMPRESSION: No acute intracranial pathology. Electronically Signed   By: Kathreen Devoid   On: 06/19/2019 20:02   US OB Comp Less 14 Wks  Result  Date: 06/19/2019 CLINICAL DATA:  Vaginal bleeding in 1st trimester pregnancy. EXAM: OBSTETRIC <14 WK [place] AND TRANSVAGINAL OB US TECHNIQUE: Both transabdominal and transvaginal ultrasound examinations were performed for complete evaluation of the gestation as well as the maternal uterus, adnexal regions, and pelvic cul-de-sac. Transvaginal technique was performed to assess early pregnancy. COMPARISON:  None. FINDINGS: Intrauterine gestational sac: None Maternal uterus/adnexae: Fluid or blood is seen throughout the endometrial cavity. No fibroids identified. Both ovaries are normal in appearance. No adnexal mass or abnormal free fluid identified. IMPRESSION: Pregnancy of unknown anatomic location (no intrauterine gestational sac or adnexal mass identified). Fluid or blood throughout the endometrial cavity favors a recent spontaneous abortion, however, IUP too early to visualize, and non-visualized ectopic pregnancy cannot definitely be excluded. Recommend correlation with serial beta-hCG levels, and follow up US if warranted clinically. Electronically Signed   By: Marlaine Hind M.D.   On: 06/19/2019 18:30   US OB Transvaginal  Result Date: 06/19/2019 CLINICAL DATA:  Vaginal bleeding in 1st trimester pregnancy. EXAM: OBSTETRIC <14 WK [place] AND TRANSVAGINAL OB US TECHNIQUE: Both transabdominal and transvaginal ultrasound examinations were performed for complete evaluation of the gestation as well as the maternal uterus, adnexal regions, and pelvic cul-de-sac. Transvaginal technique was performed to assess early pregnancy. COMPARISON:  None. FINDINGS: Intrauterine gestational sac: None Maternal uterus/adnexae: Fluid or blood is seen throughout the endometrial cavity. No fibroids identified. Both ovaries are normal in  appearance. No adnexal mass or abnormal free fluid identified. IMPRESSION: Pregnancy of unknown anatomic location (no intrauterine gestational sac or adnexal mass identified). Fluid or blood throughout the  endometrial cavity favors a recent spontaneous abortion, however, IUP too early to visualize, and non-visualized ectopic pregnancy cannot definitely be excluded. Recommend correlation with serial beta-hCG levels, and follow up US if warranted clinically. Electronically Signed   By: Marlaine Hind M.D.   On: 06/19/2019 18:30    (Echo, Carotid, EGD, Colonoscopy, ERCP)    Subjective:  She is resting in bed refusing an EEG.  Still have some bleeding from the vagina but it is decreasing. Discharge Exam: Vitals:   06/20/19 0032 06/20/19 0548  BP: (!) 163/108 (!) 145/88  Pulse: 71 75  Resp: 16 16  Temp: 99.8 F (37.7 C) 99.3 F (37.4 C)  SpO2: 100% 100%   Vitals:   06/19/19 2345 06/20/19 0000 06/20/19 0032 06/20/19 0548  BP: (!) 168/102 (!) 172/108 (!) 163/108 (!) 145/88  Pulse: 72 80 71 75  Resp: 19 (!) 21 16 16   Temp:   99.8 F (37.7 C) 99.3 F (37.4 C)  TempSrc:   Oral Oral  SpO2: 97% 98% 100% 100%  Weight:    70.6 kg  Height:        General: Pt is alert, awake, not in acute distress Cardiovascular: RRR, S1/S2 +, no rubs, no gallops Respiratory: CTA bilaterally, no wheezing, no rhonchi Abdominal: Soft, NT, ND, bowel sounds + Extremities: no edema, no cyanosis    The results of significant diagnostics from this hospitalization (including imaging, microbiology, ancillary and laboratory) are listed below for reference.     Microbiology: Recent Results (from the past 240 hour(s))  SARS CORONAVIRUS 2 (TAT 6-24 HRS) Nasopharyngeal Nasopharyngeal Swab     Status: None   Collection Time: 06/19/19 10:40 PM   Specimen: Nasopharyngeal Swab  Result Value Ref Range Status   SARS Coronavirus 2 NEGATIVE NEGATIVE Final    Comment: (NOTE) SARS-CoV-2 target nucleic acids are NOT DETECTED. The SARS-CoV-2 RNA is generally detectable in upper and lower respiratory specimens during the acute phase of infection. Negative results do not preclude SARS-CoV-2 infection, do not rule  out co-infections with other pathogens, and should not be used as the sole basis for treatment or other patient management decisions. Negative results must be combined with clinical observations, patient history, and epidemiological information. The expected result is Negative. Fact Sheet for Patients: SugarRoll.be Fact Sheet for Healthcare Providers: https://www.woods[employer].com/ This test is not yet approved or cleared by the [place] FDA and  has been authorized for detection and/or diagnosis of SARS-CoV-2 by FDA under an Emergency Use Authorization (EUA). This EUA will remain  in effect (meaning this test can be used) for the duration of the COVID-19 declaration under Section 56 4(b)(1) of the Act, 21 U.S.C. section 360bbb-3(b)(1), unless the authorization is terminated or revoked sooner. Performed at [facility], [facility] [address], [place], [place] [address]      Labs: BNP (last 3 results) No results for input(s): BNP in the last 8760 hours. Basic Metabolic Panel: Recent Labs  Lab 06/19/19 1546 06/20/19 0042  NA 132*  --   K 4.1  --   CL 101  --   CO2 17*  --   GLUCOSE 197*  --   BUN 7  --   CREATININE 1.14*  --   CALCIUM 9.1  --   MG  --  2.0  PHOS  --  3.1   Liver Function Tests: Recent Labs  Lab 06/19/19 1546  AST 21  ALT 12  ALKPHOS 54  BILITOT 0.8  PROT 7.9  ALBUMIN 3.9   No results for input(s): LIPASE, AMYLASE in the last 168 hours. No results for input(s): AMMONIA in the last 168 hours. CBC: Recent Labs  Lab 06/19/19 1546  WBC 12.0*  NEUTROABS 10.3*  HGB 13.3  HCT 40.3  MCV 90.4  PLT 641*   Cardiac Enzymes: No results for input(s): CKTOTAL, CKMB, CKMBINDEX, TROPONINI in the last 168 hours. BNP: Invalid input(s): POCBNP CBG: Recent Labs  Lab 06/19/19 1540  GLUCAP 184*   D-Dimer No results for input(s): DDIMER in the last 72 hours. Hgb A1c No results for input(s): HGBA1C in the  last 72 hours. Lipid Profile No results for input(s): CHOL, HDL, LDLCALC, TRIG, CHOLHDL, LDLDIRECT in the last 72 hours. Thyroid function studies Recent Labs    06/20/19 0042  TSH 2.504   Anemia work up No results for input(s): VITAMINB12, FOLATE, FERRITIN, TIBC, IRON, RETICCTPCT in the last 72 hours. Urinalysis    Component Value Date/Time   COLORURINE YELLOW 06/19/2019 1740   APPEARANCEUR CLOUDY (A) 06/19/2019 1740   LABSPEC 1.010 06/19/2019 1740   PHURINE 6.0 06/19/2019 1740   GLUCOSEU NEGATIVE 06/19/2019 1740   HGBUR MODERATE (A) 06/19/2019 1740   BILIRUBINUR NEGATIVE 06/19/2019 1740   KETONESUR 5 (A) 06/19/2019 1740   PROTEINUR 100 (A) 06/19/2019 1740   NITRITE NEGATIVE 06/19/2019 1740   LEUKOCYTESUR MODERATE (A) 06/19/2019 1740   Sepsis Labs Invalid input(s): PROCALCITONIN,  WBC,  LACTICIDVEN Microbiology Recent Results (from the past 240 hour(s))  SARS CORONAVIRUS 2 (TAT 6-24 HRS) Nasopharyngeal Nasopharyngeal Swab     Status: None   Collection Time: 06/19/19 10:40 PM   Specimen: Nasopharyngeal Swab  Result Value Ref Range Status   SARS Coronavirus 2 NEGATIVE NEGATIVE Final    Comment: (NOTE) SARS-CoV-2 target nucleic acids are NOT DETECTED. The SARS-CoV-2 RNA is generally detectable in upper and lower respiratory specimens during the acute phase of infection. Negative results do not preclude SARS-CoV-2 infection, do not rule out co-infections with other pathogens, and should not be used as the sole basis for treatment or other patient management decisions. Negative results must be combined with clinical observations, patient history, and epidemiological information. The expected result is Negative. Fact Sheet for Patients: SugarRoll.be Fact Sheet for Healthcare Providers: https://www.woods[employer].com/ This test is not yet approved or cleared by the [place] FDA and  has been authorized for detection and/or  diagnosis of SARS-CoV-2 by FDA under an Emergency Use Authorization (EUA). This EUA will remain  in effect (meaning this test can be used) for the duration of the COVID-19 declaration under Section 56 4(b)(1) of the Act, 21 U.S.C. section 360bbb-3(b)(1), unless the authorization is terminated or revoked sooner. Performed at [facility], [place] [address] [place], [place] [address]      Time coordinating discharge:  39 minutes  SIGNED:   Georgette Shell, MD  [facility] 06/20/2019, 11:13 AM Pager   If 7PM-7AM, please contact night-coverage [email or web address] Password [email or web address]

## 2019-06-20 NOTE — Consult Note (Signed)
NEUROLOGY CONSULT CC: speech problems  Referring MD: Dr. Zigmund Daniel   HPI: Phyllis Hopkins is a 43 y.o. female with history of hypertension, heart murmur admitted to [facility] for HTN urgency, last normal at 12:30pm today prior to going for shoulder xray, noted to have sudden onset difficutly with speech. Reported non compliance to medications for several months with blood pressure of 201/124.   Patient stated to hospital staff that earlier that day she developed abdominal cramping with associated vaginal bleeding.  Vaginal ultrasound was worrisome for possible spontaneous abortion versus ectopic pregnancy.  It was deemed that she did have spontaneous abortion and gynecology was contacted.  After discussion gynecology stated there was nothing they would do at this point. No active bleeding.  While in the hospital patient also complained of right shoulder pain.  She was brought down to x-ray.  While she was in x-ray she stated that she felt hot and not well.  During the time that she was receiving her x-rays, the tech states that patient was acting abnormal/confused.  All x-rays were eventually performed.  Patient was then placed back on stretcher and brought to room.  Upon entering the room it was noted that patient was having difficulty talking and appeared confused.  At that time code stroke was initiated.  Patient was immediately brought down for code stroke CT of head.  CT head showed no bleed.  Due to patient having significant deficit of aphasia and no contraindications to administer tPA, mother was contacted and gave consent for TPA. CBG 101  After administering TPA patient was immediately brought up to 4 [address]. ICU.  Work up that has been done: CT head, CTA of head and neck, CT perfusion  LKW: 12:30 PM on 06/20/2019 tpa given?:  Yes Premorbid modified Rankin scale (mRS): 0 NIH stroke scale of 6   Past Medical History:  Diagnosis Date  . Blood in stool    hx of  . Heart murmur   .  Hypertension      Family History  Problem Relation Age of Onset  . Hypertension Mother   . Hypertension Father   . Diabetes Father        diet controlled   Social History:   reports that she has never smoked. She has never used smokeless tobacco. She reports that she does not drink alcohol or use drugs.  Medications  Current Facility-Administered Medications:  .  alteplase (ACTIVASE) 1 mg/mL infusion 63.5 mg, 0.9 mg/kg, Intravenous, Once **FOLLOWED BY** 0.9 %  sodium chloride infusion, 50 mL, Intravenous, Once, Amie Portland, MD .  labetalol (NORMODYNE) injection 10 mg, 10 mg, Intravenous, Q2H PRN, Dana Allan I, MD .  labetalol (NORMODYNE) tablet 200 mg, 200 mg, Oral, BID, Georgette Shell, MD, 200 mg at 06/20/19 1156  ROS:  Currently unable to obtain secondary to expressive and receptive aphasia  Exam: Current vital signs: BP (!) 157/102   Pulse 78   Temp 99.3 F (37.4 C) (Oral)   Resp 16   Ht 5\' 2"  (1.575 m)   Wt 70.6 kg   SpO2 100%   BMI 28.48 kg/m  Vital signs in last 24 hours: Temp:  [98.8 F (37.1 C)-99.8 F (37.7 C)] 99.3 F (37.4 C) (12/30 0548) Pulse Rate:  [71-89] 78 (12/30 1156) Resp:  [12-21] 16 (12/30 0548) BP: (139-201)/(88-140) 157/102 (12/30 1156) SpO2:  [97 %-100 %] 100 % (12/30 0548) Weight:  [70.6 kg-72.6 kg] 70.6 kg (12/30 0548) Constitutional: Appears well-developed and well-nourished.  Eyes: No scleral injection HENT: No OP obstrucion Head: Normocephalic.  Cardiovascular: Normal rate and regular rhythm.  Respiratory: Effort normal, non-labored breathing GI: Soft.  No distension. There is no tenderness.  Skin: WDI Neuro: Mental Status: Awake, alert, able to mimic but not follow single step commands. She had verbal output with word salad, non intelligible speech which improved some after she got to ICU but still severely aphasic. No dysarthria Cranial Nerves: II: Visual Fields are full.  III,IV, VI: EOMI without ptosis or  diploplia. Pupils equal, round and reactive to light V: Facial sensation is symmetric to temperature VII: Facial movement is symmetric.  VIII: hearing is intact to voice X: Palat elevates symmetrically XI: Shoulder shrug is symmetric. XII: tongue is midline without atrophy or fasciculations.  Motor: Patient was able to hold all extremities antigravity with no drift however she did have pain in her right shoulder which limited her range of motion Sensory: Sensation is symmetric to light touch and temperature in the arms and legs. Deep Tendon Reflexes: 2+ and symmetric in the biceps and patellae.  Plantars: Toes are downgoing bilaterally.  Cerebellar: FNF and HKS are intact bilaterally  Labs I have reviewed labs in epic and the results pertinent to this consultation are:  CBC    Component Value Date/Time   WBC 8.2 06/20/2019 1136   RBC 3.95 06/20/2019 1136   HGB 12.0 06/20/2019 1136   HCT 34.9 (L) 06/20/2019 1136   PLT 522 (H) 06/20/2019 1136   MCV 88.4 06/20/2019 1136   MCH 30.4 06/20/2019 1136   MCHC 34.4 06/20/2019 1136   RDW 14.5 06/20/2019 1136   LYMPHSABS 2.0 06/20/2019 1136   MONOABS 1.0 06/20/2019 1136   EOSABS 0.0 06/20/2019 1136   BASOSABS 0.0 06/20/2019 1136   CMP     Component Value Date/Time   NA 132 (L) 06/19/2019 1546   K 4.1 06/19/2019 1546   CL 101 06/19/2019 1546   CO2 17 (L) 06/19/2019 1546   GLUCOSE 197 (H) 06/19/2019 1546   BUN 7 06/19/2019 1546   CREATININE 1.14 (H) 06/19/2019 1546   CREATININE 0.72 11/27/2013 1157   CALCIUM 9.1 06/19/2019 1546   PROT 7.9 06/19/2019 1546   ALBUMIN 3.9 06/19/2019 1546   AST 21 06/19/2019 1546   ALT 12 06/19/2019 1546   ALKPHOS 54 06/19/2019 1546   BILITOT 0.8 06/19/2019 1546   GFRNONAA 58 (L) 06/19/2019 1546   GFRAA >60 06/19/2019 1546   Imaging I have reviewed the images obtained  CT-scan of the brain-negative CT of head  CTA head and neck along with perfusion-negative for large vessel occlusion and  negative   Etta Quill PA-C [facility] [phone]  M-F  (9:00 am- 5:00 PM)  06/20/2019, 1:58 PM    Attending Neurohospitalist Addendum Patient seen and examined with APP/Resident. Agree with the history and physical as documented above. Agree with the plan as documented, which I helped formulate. I have independently reviewed the chart, obtained history, review of systems and examined the patient.I have personally reviewed pertinent head/neck/spine imaging (CT/MRI).  Assessment:  This is a 43 year old female initially brought to the hospital secondary to hypertensive urgency and vaginal bleeding.  She was found to have a spontaneous abortion.  Patient was to be discharged today however, prior to discharge found to have significant aphasia to which code stroke was initiated.  Stroke CT head did not show any acute infarct and/or hemorrhage, along with CTA head and neck showing no large vessel occlusion and CTP  was negative.  Due to disabling symptoms-aphasia and risk factors for stroke, TPA was offered. Risks benefits discussed with mother by attending on phone. She consented to proceed.  TPA was administered and patient was immediately brought to neuro ICU.   Liklely had a cortical infarct affecting the speech centers.    Given that she did have vaginal bleeding, although it is not a contraindication, will still need to monitor closely after TPA administration.  Plan: Acute Ischemic Stroke Etiology: under investigation.  Acuity: Acute -Admit to: ICU -Continue Statin -Hold Aspirin until 24 hour post tPA neuroimaging is stable and without evidence of bleeding -Blood pressure control, goal of SYS <180 -MRI/ECHO/A1C/Lipid panel. -Hyperglycemia management per SSI to maintain glucose 140-180mg /dL. -PT/OT therapies and recommendations when able -Consider outpatient hypercoag w/u (not sure it will have meaningful yield after tPA administration) -Consider TEE after TTE if  deemed appropriate. -LE venous dopplers  CNS -Close neuromonitoring -vitals and neurochecks per post tPA  Aphasia -ST  CV Essential (primary) hypertension HTN Urgency HTN emergency -Aggressive BP control, goal SBP < 180 s/p tPA -If blood pressure exceeds 180/105 -use labetalol and hydralazine PRN. If continuous drip is needed, use cleviprex. I would avoid drips as she is stable enough to transfer to progressive floor in the next several hours if BPs remain stable.  Hyperlipidemia, unspecified  - Statin for goal LDL < 70    HEME H&H stable. -due to history of vaginal bleeding, and nowstatus post TPA administration, check H&H q12h x2 -transfuse for hgb < 7   ENDO No current active issues -goal HgbA1c < 7  GI/GU -History of vaginal bleeding/possible spontaneous abortion vs ectopic pregnancy - per OBGYN as outpatient. No emergent inpatient intervention per admitting team  Fluid/Electrolyte Disorders Hydration with normal saline at 75 cc/h  Prophylaxis DVT: SCD GI: Protonix Bowel: Senokot  Diet: NPO until cleared by speech  Code Status: Full Code   Plan d/w Dr. Zigmund Daniel.  Patient to be admitted to neuroICU for next several hours and can be transitioned to progressive floor if remains stable in terms of vitals and neurochecks.  Once off the ICU, TRH to assume primary.  Attending Neurohospitalist Addendum Patient seen and examined with APP/Resident. Agree with the history and physical as documented above. Agree with the plan as documented, which I helped formulate. I have independently reviewed the chart, obtained history, review of systems and examined the patient.I have personally reviewed pertinent head/neck/spine imaging (CT/MRI). Please feel free to call with any questions. --- Amie Portland, MD [facility] Pager: [phone]  If 7pm to 7am, please call on call as listed on AMION.  CRITICAL CARE ATTESTATION Performed by: Amie Portland,  MD Total critical care time: 45 minutes Critical care time was exclusive of separately billable procedures and treating other patients and/or supervising APPs/Residents/Students Critical care was necessary to treat or prevent imminent or life-threatening deterioration due to acute ischemic stroke, IV thrombolytic administration.  This patient is critically ill and at significant risk for neurological worsening and/or death and care requires constant monitoring. Critical care was time spent personally by me on the following activities: development of treatment plan with patient and/or surrogate as well as nursing, discussions with consultants, evaluation of patient's response to treatment, examination of patient, obtaining history from patient or surrogate, ordering and performing treatments and interventions, ordering and review of laboratory studies, ordering and review of radiographic studies, pulse oximetry, re-evaluation of patient's condition, participation in multidisciplinary rounds and medical decision making of high complexity in the  care of this patient.

## 2019-06-20 NOTE — Progress Notes (Signed)
Pharmacist Code Stroke Response  Notified to mix tPA at 1341 by Dr. Rory Percy Delivered tPA to RN at 1344  tPA dose = 6.4mg  bolus over 1 minute followed by 57.1mg  for a total dose of 63.5mg  over 1 hour  Issues/delays encountered (if applicable): none   Phyllis Hopkins, PharmD, BCPS Please check AMION for all [place] contact numbers Clinical Pharmacist 06/20/2019 1:51 PM

## 2019-06-20 NOTE — Progress Notes (Signed)
Patient ambulated in hall without difficulty.  Stated she just felt tired.

## 2019-06-20 NOTE — Code Documentation (Signed)
Stroke Response Nurse Documentation Code Documentation  Phyllis Hopkins is a 43 y.o. female  Inpatient on [address] [place]. [facility]   On 06/20/2019 with past medical hx of HTN. Code stroke was activated by primary RN. Patient from [place] where she was LKW at 31 and now complaining of aphasia . On No antithrombotic. Stroke team to bedside at 1326 patient cleared for CT by Dr. Rory Percy. Patient to CT with team. NIHSS 4, see documentation for details and code stroke times. Patient with NIHSS 4 on exam. The following imaging was completed:   (CT, CTA head and neck, CTP). Patient is a candidate for tPA.  TPA given in CT at 1348. Pt transported to [place] ICU. Bedside handoff with ICU RN Marlowe Kays.  Jeannine Kitten  Stroke Response RN

## 2019-06-20 NOTE — Significant Event (Signed)
CODE STROKE - Rapid Response   Time Called: [id number] Time: 36 Called by bedside RN stating she had paged out a code stroke prior to Code Stroke page. See Stroke team documentation.   Transferred to [place]   End Call Time: [place] Danyale Ridinger, Lattie Haw

## 2019-06-21 ENCOUNTER — Inpatient Hospital Stay (HOSPITAL_COMMUNITY): Payer: Federal, State, Local not specified - PPO

## 2019-06-21 ENCOUNTER — Ambulatory Visit: Payer: Federal, State, Local not specified - PPO

## 2019-06-21 ENCOUNTER — Encounter (HOSPITAL_COMMUNITY): Payer: Self-pay | Admitting: Student in an Organized Health Care Education/Training Program

## 2019-06-21 DIAGNOSIS — R569 Unspecified convulsions: Secondary | ICD-10-CM

## 2019-06-21 DIAGNOSIS — R2990 Unspecified symptoms and signs involving the nervous system: Secondary | ICD-10-CM

## 2019-06-21 DIAGNOSIS — I63412 Cerebral infarction due to embolism of left middle cerebral artery: Secondary | ICD-10-CM | POA: Diagnosis not present

## 2019-06-21 LAB — LIPID PANEL
Cholesterol: 145 mg/dL (ref 0–200)
HDL: 36 mg/dL — ABNORMAL LOW (ref >40)
LDL Cholesterol: 93 mg/dL (ref 0–99)
Total CHOL/HDL Ratio: 4 RATIO
Triglycerides: 81 mg/dL (ref <150)
VLDL: 16 mg/dL (ref 0–40)

## 2019-06-21 LAB — CBC
HCT: 30.1 % — ABNORMAL LOW (ref 36.0–46.0)
Hemoglobin: 10.1 g/dL — ABNORMAL LOW (ref 12.0–15.0)
MCH: 30.1 pg (ref 26.0–34.0)
MCHC: 33.6 g/dL (ref 30.0–36.0)
MCV: 89.6 fL (ref 80.0–100.0)
Platelets: 488 10*3/uL — ABNORMAL HIGH (ref 150–400)
RBC: 3.36 MIL/uL — ABNORMAL LOW (ref 3.87–5.11)
RDW: 14.7 % (ref 11.5–15.5)
WBC: 6.6 10*3/uL (ref 4.0–10.5)
nRBC: 0 % (ref 0.0–0.2)

## 2019-06-21 LAB — BASIC METABOLIC PANEL
Anion gap: 9 (ref 5–15)
BUN: 7 mg/dL (ref 6–20)
CO2: 21 mmol/L — ABNORMAL LOW (ref 22–32)
Calcium: 7.8 mg/dL — ABNORMAL LOW (ref 8.9–10.3)
Chloride: 107 mmol/L (ref 98–111)
Creatinine, Ser: 0.88 mg/dL (ref 0.44–1.00)
GFR calc Af Amer: >60 mL/min (ref >60)
GFR calc non Af Amer: >60 mL/min (ref >60)
Glucose, Bld: 92 mg/dL (ref 70–99)
Potassium: 3 mmol/L — ABNORMAL LOW (ref 3.5–5.1)
Sodium: 137 mmol/L (ref 135–145)

## 2019-06-21 LAB — HEMOGLOBIN A1C
Hgb A1c MFr Bld: 5.5 % (ref 4.8–5.6)
Mean Plasma Glucose: 111.15 mg/dL

## 2019-06-21 LAB — RAPID URINE DRUG SCREEN, HOSP PERFORMED
Amphetamines: NOT DETECTED
Barbiturates: NOT DETECTED
Benzodiazepines: NOT DETECTED
Cocaine: NOT DETECTED
Opiates: NOT DETECTED
Tetrahydrocannabinol: POSITIVE — AB

## 2019-06-21 LAB — URINALYSIS, COMPLETE (UACMP) WITH MICROSCOPIC
Bilirubin Urine: NEGATIVE
Glucose, UA: NEGATIVE mg/dL
Ketones, ur: 80 mg/dL — AB
Nitrite: NEGATIVE
Protein, ur: 100 mg/dL — AB
Specific Gravity, Urine: 1.03 (ref 1.005–1.030)
WBC, UA: >50 WBC/hpf — ABNORMAL HIGH (ref 0–5)
pH: 6 (ref 5.0–8.0)

## 2019-06-21 LAB — HEMOGLOBIN AND HEMATOCRIT, BLOOD
HCT: 31.5 % — ABNORMAL LOW (ref 36.0–46.0)
Hemoglobin: 10.4 g/dL — ABNORMAL LOW (ref 12.0–15.0)

## 2019-06-21 LAB — HCG, QUANTITATIVE, PREGNANCY: hCG, Beta Chain, Quant, S: 2 m[IU]/mL (ref <5)

## 2019-06-21 MED ORDER — ATORVASTATIN CALCIUM 40 MG PO TABS
40.0000 mg | ORAL_TABLET | Freq: Every day | ORAL | Status: DC
  Administered 2019-06-21 – 2019-06-25 (×5): 40 mg via ORAL
  Filled 2019-06-21 (×6): qty 1

## 2019-06-21 MED ORDER — ASPIRIN EC 81 MG PO TBEC
81.0000 mg | DELAYED_RELEASE_TABLET | Freq: Every day | ORAL | Status: DC
  Administered 2019-06-21 – 2019-06-25 (×5): 81 mg via ORAL
  Filled 2019-06-21 (×5): qty 1

## 2019-06-21 MED ORDER — LORAZEPAM 2 MG/ML IJ SOLN: 2.0000 mg | Freq: Once | INTRAMUSCULAR | Status: DC

## 2019-06-21 MED ORDER — SODIUM CHLORIDE 0.9 % IV SOLN
1.0000 g | INTRAVENOUS | Status: DC
  Administered 2019-06-21 – 2019-06-25 (×5): 1 g via INTRAVENOUS
  Filled 2019-06-21: qty 1
  Filled 2019-06-21 (×5): qty 10

## 2019-06-21 MED ORDER — PANTOPRAZOLE SODIUM 40 MG PO TBEC
40.0000 mg | DELAYED_RELEASE_TABLET | Freq: Every day | ORAL | Status: DC
  Administered 2019-06-21 – 2019-06-25 (×5): 40 mg via ORAL
  Filled 2019-06-21 (×5): qty 1

## 2019-06-21 MED ORDER — POTASSIUM CHLORIDE CRYS ER 20 MEQ PO TBCR
40.0000 meq | EXTENDED_RELEASE_TABLET | ORAL | Status: AC
  Administered 2019-06-21 (×2): 40 meq via ORAL
  Filled 2019-06-21 (×2): qty 2

## 2019-06-21 MED ORDER — CLOPIDOGREL BISULFATE 75 MG PO TABS
75.0000 mg | ORAL_TABLET | Freq: Every day | ORAL | Status: DC
  Administered 2019-06-21 – 2019-06-25 (×5): 75 mg via ORAL
  Filled 2019-06-21 (×5): qty 1

## 2019-06-21 MED ORDER — LABETALOL HCL 5 MG/ML IV SOLN
5.0000 mg | INTRAVENOUS | Status: DC | PRN
  Administered 2019-06-25: 09:00:00 20 mg via INTRAVENOUS
  Filled 2019-06-21: qty 4

## 2019-06-21 MED ORDER — DEXTROSE 50 % IV SOLN: 25.0000 mL | Freq: Once | INTRAVENOUS | Status: DC

## 2019-06-21 NOTE — Progress Notes (Signed)
EEG completed, results pending. 

## 2019-06-21 NOTE — Progress Notes (Addendum)
STROKE TEAM PROGRESS NOTE   INTERVAL HISTORY Pt lying in bed, still complains of right shoulder pain. Her speech has been resolved. She finally agrees for EEG today.   Vitals:   06/21/19 0500 06/21/19 0600 06/21/19 0700 06/21/19 0800  BP: 104/64 121/77 128/80 (!) 139/96  Pulse: 87 74 80 80  Resp: 18 16 18  (!) 8  Temp:    99 F (37.2 C)  TempSrc:    Oral  SpO2: 98% 98% 99% 100%  Weight:      Height:        CBC:  Recent Labs  Lab 06/19/19 1546 06/20/19 1136 06/20/19 1536 06/21/19 0304  WBC 12.0* 8.2  --   --   NEUTROABS 10.3* 5.1  --   --   HGB 13.3 12.0 12.1 10.4*  HCT 40.3 34.9* 36.5 31.5*  MCV 90.4 88.4  --   --   PLT 641* 522*  --   --     Basic Metabolic Panel:  Recent Labs  Lab 06/19/19 1546 06/20/19 0042  NA 132*  --   K 4.1  --   CL 101  --   CO2 17*  --   GLUCOSE 197*  --   BUN 7  --   CREATININE 1.14*  --   CALCIUM 9.1  --   MG  --  2.0  PHOS  --  3.1   Lipid Panel:     Component Value Date/Time   CHOL 145 06/21/2019 0304   TRIG 81 06/21/2019 0304   HDL 36 (L) 06/21/2019 0304   CHOLHDL 4.0 06/21/2019 0304   VLDL 16 06/21/2019 0304   LDLCALC 93 06/21/2019 0304   HgbA1c:  Lab Results  Component Value Date   HGBA1C 5.5 06/21/2019   Urine Drug Screen: No results found for: LABOPIA, COCAINSCRNUR, LABBENZ, AMPHETMU, THCU, LABBARB  Alcohol Level     Component Value Date/Time   ETH <10 06/19/2019 1546    IMAGING CT ANGIO HEAD W OR WO CONTRAST  Result Date: 06/20/2019 CLINICAL DATA:  Stroke follow-up. EXAM: CT ANGIOGRAPHY HEAD AND NECK CT PERFUSION BRAIN TECHNIQUE: Multidetector CT imaging of the head and neck was performed using the standard protocol during bolus administration of intravenous contrast. Multiplanar CT image reconstructions and MIPs were obtained to evaluate the vascular anatomy. Carotid stenosis measurements (when applicable) are obtained utilizing NASCET criteria, using the distal internal carotid diameter as the denominator.  Multiphase CT imaging of the brain was performed following IV bolus contrast injection. Subsequent parametric perfusion maps were calculated using RAPID software. CONTRAST:  149mL OMNIPAQUE IOHEXOL 350 MG/ML SOLN COMPARISON:  None. FINDINGS: CTA NECK FINDINGS Aortic arch: Normal variant aortic arch branching pattern with common origin of the brachiocephalic and left common carotid arteries. Widely patent arch vessel origins. Right carotid system: Patent without evidence of stenosis or dissection. Left carotid system: Patent without evidence of stenosis or dissection. Vertebral arteries: Patent and codominant without evidence of stenosis or dissection. Skeleton: No acute osseous abnormality or suspicious osseous lesion. Other neck: No evidence of cervical lymphadenopathy or mass. Upper chest: Clear lung apices. Review of the MIP images confirms the above findings CTA HEAD FINDINGS Anterior circulation: The internal carotid arteries are widely patent from skull base to carotid termini. ACAs and MCAs are patent without evidence of proximal branch occlusion or significant proximal stenosis. No aneurysm is identified. Posterior circulation: The intracranial vertebral arteries are widely patent to the basilar. Patent right PICA, left AICA, and bilateral SCA origins are identified.  The basilar artery is widely patent. Posterior communicating arteries are diminutive or absent. PCAs are patent without evidence of significant proximal stenosis. No aneurysm is identified. Venous sinuses: As permitted by contrast timing, patent. Hypoplastic left transverse and sigmoid sinuses. Anatomic variants: None. Review of the MIP images confirms the above findings CT Brain Perfusion Findings: ASPECTS: 10 CBF (<30%) Volume: 69mL Perfusion (Tmax>6.0s) volume: 77mL IMPRESSION: 1. No large vessel occlusion or significant stenosis in the head or neck. 2. Negative CTP. These results were communicated to Dr. Rory Percy at 2:14 pm on 06/20/2019 by text  page via the [facility] messaging system. Electronically Signed   By: Logan Bores M.D.   On: 06/20/2019 14:25   DG Shoulder Right  Result Date: 06/20/2019 CLINICAL DATA:  Right shoulder pain EXAM: RIGHT SHOULDER - 2+ VIEW COMPARISON:  None. FINDINGS: There is no evidence of fracture or dislocation. There is no evidence of arthropathy or other focal bone abnormality. Soft tissues are unremarkable. IMPRESSION: No acute abnormality noted. Electronically Signed   By: Inez Catalina M.D.   On: 06/20/2019 13:43   CT Head Wo Contrast  Result Date: 06/19/2019 CLINICAL DATA:  Encephalopathy.  Foaming at the mouth. EXAM: CT HEAD WITHOUT CONTRAST TECHNIQUE: Contiguous axial images were obtained from the base of the skull through the vertex without intravenous contrast. COMPARISON:  None. FINDINGS: Brain: No evidence of acute infarction, hemorrhage, hydrocephalus, extra-axial collection or mass lesion/mass effect. Vascular: No hyperdense vessel or unexpected calcification. Skull: No osseous abnormality. Sinuses/Orbits: Visualized paranasal sinuses are clear. Visualized mastoid sinuses are clear. Visualized orbits demonstrate no focal abnormality. Other: None IMPRESSION: No acute intracranial pathology. Electronically Signed   By: Kathreen Devoid   On: 06/19/2019 20:02   CT ANGIO NECK W OR WO CONTRAST  Result Date: 06/20/2019 CLINICAL DATA:  Stroke follow-up. EXAM: CT ANGIOGRAPHY HEAD AND NECK CT PERFUSION BRAIN TECHNIQUE: Multidetector CT imaging of the head and neck was performed using the standard protocol during bolus administration of intravenous contrast. Multiplanar CT image reconstructions and MIPs were obtained to evaluate the vascular anatomy. Carotid stenosis measurements (when applicable) are obtained utilizing NASCET criteria, using the distal internal carotid diameter as the denominator. Multiphase CT imaging of the brain was performed following IV bolus contrast injection. Subsequent parametric perfusion  maps were calculated using RAPID software. CONTRAST:  182mL OMNIPAQUE IOHEXOL 350 MG/ML SOLN COMPARISON:  None. FINDINGS: CTA NECK FINDINGS Aortic arch: Normal variant aortic arch branching pattern with common origin of the brachiocephalic and left common carotid arteries. Widely patent arch vessel origins. Right carotid system: Patent without evidence of stenosis or dissection. Left carotid system: Patent without evidence of stenosis or dissection. Vertebral arteries: Patent and codominant without evidence of stenosis or dissection. Skeleton: No acute osseous abnormality or suspicious osseous lesion. Other neck: No evidence of cervical lymphadenopathy or mass. Upper chest: Clear lung apices. Review of the MIP images confirms the above findings CTA HEAD FINDINGS Anterior circulation: The internal carotid arteries are widely patent from skull base to carotid termini. ACAs and MCAs are patent without evidence of proximal branch occlusion or significant proximal stenosis. No aneurysm is identified. Posterior circulation: The intracranial vertebral arteries are widely patent to the basilar. Patent right PICA, left AICA, and bilateral SCA origins are identified. The basilar artery is widely patent. Posterior communicating arteries are diminutive or absent. PCAs are patent without evidence of significant proximal stenosis. No aneurysm is identified. Venous sinuses: As permitted by contrast timing, patent. Hypoplastic left transverse and sigmoid sinuses. Anatomic variants:  None. Review of the MIP images confirms the above findings CT Brain Perfusion Findings: ASPECTS: 10 CBF (<30%) Volume: 1mL Perfusion (Tmax>6.0s) volume: 44mL IMPRESSION: 1. No large vessel occlusion or significant stenosis in the head or neck. 2. Negative CTP. These results were communicated to Dr. Rory Percy at 2:14 pm on 06/20/2019 by text page via the [facility] messaging system. Electronically Signed   By: Logan Bores M.D.   On: 06/20/2019 14:25   US OB Comp  Less 14 Wks  Result Date: 06/19/2019 CLINICAL DATA:  Vaginal bleeding in 1st trimester pregnancy. EXAM: OBSTETRIC <14 WK [place] AND TRANSVAGINAL OB US TECHNIQUE: Both transabdominal and transvaginal ultrasound examinations were performed for complete evaluation of the gestation as well as the maternal uterus, adnexal regions, and pelvic cul-de-sac. Transvaginal technique was performed to assess early pregnancy. COMPARISON:  None. FINDINGS: Intrauterine gestational sac: None Maternal uterus/adnexae: Fluid or blood is seen throughout the endometrial cavity. No fibroids identified. Both ovaries are normal in appearance. No adnexal mass or abnormal free fluid identified. IMPRESSION: Pregnancy of unknown anatomic location (no intrauterine gestational sac or adnexal mass identified). Fluid or blood throughout the endometrial cavity favors a recent spontaneous abortion, however, IUP too early to visualize, and non-visualized ectopic pregnancy cannot definitely be excluded. Recommend correlation with serial beta-hCG levels, and follow up US if warranted clinically. Electronically Signed   By: Marlaine Hind M.D.   On: 06/19/2019 18:30   US OB Transvaginal  Result Date: 06/19/2019 CLINICAL DATA:  Vaginal bleeding in 1st trimester pregnancy. EXAM: OBSTETRIC <14 WK [place] AND TRANSVAGINAL OB US TECHNIQUE: Both transabdominal and transvaginal ultrasound examinations were performed for complete evaluation of the gestation as well as the maternal uterus, adnexal regions, and pelvic cul-de-sac. Transvaginal technique was performed to assess early pregnancy. COMPARISON:  None. FINDINGS: Intrauterine gestational sac: None Maternal uterus/adnexae: Fluid or blood is seen throughout the endometrial cavity. No fibroids identified. Both ovaries are normal in appearance. No adnexal mass or abnormal free fluid identified. IMPRESSION: Pregnancy of unknown anatomic location (no intrauterine gestational sac or adnexal mass identified). Fluid or  blood throughout the endometrial cavity favors a recent spontaneous abortion, however, IUP too early to visualize, and non-visualized ectopic pregnancy cannot definitely be excluded. Recommend correlation with serial beta-hCG levels, and follow up US if warranted clinically. Electronically Signed   By: Marlaine Hind M.D.   On: 06/19/2019 18:30   CT CEREBRAL PERFUSION W CONTRAST  Result Date: 06/20/2019 CLINICAL DATA:  Stroke follow-up. EXAM: CT ANGIOGRAPHY HEAD AND NECK CT PERFUSION BRAIN TECHNIQUE: Multidetector CT imaging of the head and neck was performed using the standard protocol during bolus administration of intravenous contrast. Multiplanar CT image reconstructions and MIPs were obtained to evaluate the vascular anatomy. Carotid stenosis measurements (when applicable) are obtained utilizing NASCET criteria, using the distal internal carotid diameter as the denominator. Multiphase CT imaging of the brain was performed following IV bolus contrast injection. Subsequent parametric perfusion maps were calculated using RAPID software. CONTRAST:  129mL OMNIPAQUE IOHEXOL 350 MG/ML SOLN COMPARISON:  None. FINDINGS: CTA NECK FINDINGS Aortic arch: Normal variant aortic arch branching pattern with common origin of the brachiocephalic and left common carotid arteries. Widely patent arch vessel origins. Right carotid system: Patent without evidence of stenosis or dissection. Left carotid system: Patent without evidence of stenosis or dissection. Vertebral arteries: Patent and codominant without evidence of stenosis or dissection. Skeleton: No acute osseous abnormality or suspicious osseous lesion. Other neck: No evidence of cervical lymphadenopathy or mass. Upper chest: Clear lung  apices. Review of the MIP images confirms the above findings CTA HEAD FINDINGS Anterior circulation: The internal carotid arteries are widely patent from skull base to carotid termini. ACAs and MCAs are patent without evidence of proximal  branch occlusion or significant proximal stenosis. No aneurysm is identified. Posterior circulation: The intracranial vertebral arteries are widely patent to the basilar. Patent right PICA, left AICA, and bilateral SCA origins are identified. The basilar artery is widely patent. Posterior communicating arteries are diminutive or absent. PCAs are patent without evidence of significant proximal stenosis. No aneurysm is identified. Venous sinuses: As permitted by contrast timing, patent. Hypoplastic left transverse and sigmoid sinuses. Anatomic variants: None. Review of the MIP images confirms the above findings CT Brain Perfusion Findings: ASPECTS: 10 CBF (<30%) Volume: 87mL Perfusion (Tmax>6.0s) volume: 71mL IMPRESSION: 1. No large vessel occlusion or significant stenosis in the head or neck. 2. Negative CTP. These results were communicated to Dr. Rory Percy at 2:14 pm on 06/20/2019 by text page via the [facility] messaging system. Electronically Signed   By: Logan Bores M.D.   On: 06/20/2019 14:25   ECHOCARDIOGRAM COMPLETE  Result Date: 06/20/2019   ECHOCARDIOGRAM REPORT   Patient Name:   MALEYNA MCEUEN Date of Exam: 06/20/2019 Medical Rec #:  [id number]             Height:       62.0 in Accession #:    [id number]            Weight:       155.7 lb Date of Birth:  [birth year]             BSA:          1.72 m Patient Age:    43 years              BP:           183/114 mmHg Patient Gender: F                     HR:           75 bpm. Exam Location:  Inpatient Procedure: 2D Echo, Color Doppler and Cardiac Doppler Indications:    Stroke  History:        Patient has no prior history of Echocardiogram examinations.                 Signs/Symptoms:Murmur; Risk Factors:Hypertension. Pregnant at                 time of study.  Sonographer:    Raquel Sarna Senior RDCS Referring Phys: [employer]  1. Left ventricular ejection fraction, by visual estimation, is 60 to 65%. The left ventricle has normal function. There is  no left ventricular hypertrophy.  2. The left ventricle has no regional wall motion abnormalities.  3. Global right ventricle has normal systolic function.The right ventricular size is normal. No increase in right ventricular wall thickness.  4. Left atrial size was normal.  5. Right atrial size was normal.  6. Presence of pericardial fat pad.  7. Trivial pericardial effusion is present.  8. The mitral valve is grossly normal. Trivial mitral valve regurgitation.  9. The tricuspid valve is grossly normal. 10. The aortic valve is tricuspid. Aortic valve regurgitation is not visualized. No evidence of aortic valve sclerosis or stenosis. 11. The pulmonic valve was grossly normal. Pulmonic valve regurgitation is trivial. 12. TR signal is inadequate for assessing pulmonary artery systolic  pressure. 13. The inferior vena cava is normal in size with greater than 50% respiratory variability, suggesting right atrial pressure of 3 mmHg. 14. No prior Echocardiogram. FINDINGS  Left Ventricle: Left ventricular ejection fraction, by visual estimation, is 60 to 65%. The left ventricle has normal function. The left ventricle has no regional wall motion abnormalities. The left ventricular internal cavity size was the left ventricle is normal in size. There is no left ventricular hypertrophy. Left ventricular diastolic parameters were normal. Normal left atrial pressure. Right Ventricle: The right ventricular size is normal. No increase in right ventricular wall thickness. Global RV systolic function is has normal systolic function. Left Atrium: Left atrial size was normal in size. Right Atrium: Right atrial size was normal in size Pericardium: Trivial pericardial effusion is present. Presence of pericardial fat pad. Mitral Valve: The mitral valve is grossly normal. Trivial mitral valve regurgitation. Tricuspid Valve: The tricuspid valve is grossly normal. Tricuspid valve regurgitation is trivial. Aortic Valve: The aortic valve is  tricuspid. Aortic valve regurgitation is not visualized. The aortic valve is structurally normal, with no evidence of sclerosis or stenosis. Pulmonic Valve: The pulmonic valve was grossly normal. Pulmonic valve regurgitation is trivial. Pulmonic regurgitation is trivial. Aorta: The aortic root and ascending aorta are structurally normal, with no evidence of dilitation. Venous: The inferior vena cava is normal in size with greater than 50% respiratory variability, suggesting right atrial pressure of 3 mmHg. IAS/Shunts: No atrial level shunt detected by color flow Doppler.  LEFT VENTRICLE PLAX 2D LVIDd:         4.00 cm  Diastology LVIDs:         2.80 cm  LV e' lateral:   11.10 cm/s LV PW:         1.00 cm  LV E/e' lateral: 8.0 LV IVS:        1.00 cm  LV e' medial:    7.40 cm/s LVOT diam:     1.70 cm  LV E/e' medial:  12.0 LV SV:         40 ml LV SV Index:   22.73 LVOT Area:     2.27 cm  RIGHT VENTRICLE RV S prime:     11.00 cm/s TAPSE (M-mode): 2.0 cm LEFT ATRIUM             Index       RIGHT ATRIUM           Index LA diam:        2.90 cm 1.69 cm/m  RA Area:     11.40 cm LA Vol (A2C):   28.0 ml 16.29 ml/m RA Volume:   19.30 ml  11.23 ml/m LA Vol (A4C):   29.6 ml 17.22 ml/m LA Biplane Vol: 31.3 ml 18.21 ml/m  AORTIC VALVE LVOT Vmax:   73.10 cm/s LVOT Vmean:  46.200 cm/s LVOT VTI:    0.144 m  AORTA Ao Root diam: 2.40 cm Ao Asc diam:  2.80 cm MITRAL VALVE MV Area (PHT): 3.85 cm             SHUNTS MV PHT:        57.13 msec           Systemic VTI:  0.14 m MV Decel Time: 197 msec             Systemic Diam: 1.70 cm MV E velocity: 89.10 cm/s 103 cm/s MV A velocity: 60.40 cm/s 70.3 cm/s MV E/A ratio:  1.48  1.5  Eleonore Chiquito MD Electronically signed by Eleonore Chiquito MD Signature Date/Time: 06/20/2019/3:56:22 PM    Final    CT HEAD CODE STROKE WO CONTRAST  Result Date: 06/20/2019 CLINICAL DATA:  Code stroke.  Stroke follow-up EXAM: CT HEAD WITHOUT CONTRAST TECHNIQUE: Contiguous axial images were obtained from  the base of the skull through the vertex without intravenous contrast. COMPARISON:  06/19/2019 FINDINGS: Brain: No evidence of acute infarction, hemorrhage, hydrocephalus, extra-axial collection or mass lesion/mass effect. Vascular: Negative for hyperdense vessel Skull: Negative Sinuses/Orbits: Negative Other: None ASPECTS ([place] Stroke Program Early CT Score) - Ganglionic level infarction (caudate, lentiform nuclei, internal capsule, insula, M1-M3 cortex): 7 - Supraganglionic infarction (M4-M6 cortex): 3 Total score (0-10 with 10 being normal): 10 IMPRESSION: 1. Negative CT head.  No change from yesterday 2. ASPECTS is 10 Electronically Signed   By: Franchot Gallo M.D.   On: 06/20/2019 13:45    PHYSICAL EXAM  Temp:  [98.9 F (37.2 C)-100 F (37.8 C)] 99 F (37.2 C) (12/31 0800) Pulse Rate:  [72-108] 80 (12/31 0800) Resp:  [8-22] 8 (12/31 0800) BP: (97-183)/(64-121) 139/96 (12/31 0800) SpO2:  [94 %-100 %] 100 % (12/31 0800)  General - Well nourished, well developed, in no apparent distress.  Ophthalmologic - fundi not visualized due to noncooperation.  Cardiovascular - Regular rhythm and rate.  Mental Status -  Level of arousal and orientation to time, place, and person were intact. Language including expression, naming, repetition, comprehension was assessed and found intact. Fund of Knowledge was assessed and was intact.  Cranial Nerves II - XII - II - Visual field intact OU. III, IV, VI - Extraocular movements intact. V - Facial sensation intact bilaterally. VII - Facial movement intact bilaterally. VIII - Hearing & vestibular intact bilaterally. X - Palate elevates symmetrically. XI - Chin turning & shoulder shrug intact bilaterally. XII - Tongue protrusion intact.  Motor Strength - The patient's strength was normal in all extremities and pronator drift was absent except right deltoid 4/5 due to right shoulder pain.  Bulk was normal and fasciculations were absent.   Motor  Tone - Muscle tone was assessed at the neck and appendages and was normal.  Reflexes - The patient's reflexes were symmetrical in all extremities and she had no pathological reflexes.  Sensory - Light touch, temperature/pinprick were assessed and were symmetrical.    Coordination - The patient had normal movements in the hands and feet with no ataxia or dysmetria.  Tremor was absent.  Gait and Station - deferred.   ASSESSMENT/PLAN [name] is a 43 y.o. female with history of hypertension, heart murmur, medication non-compliance, admitted to [facility] for HTN urgency 12/29 and found to have a spontaneous abortion. In hospital developed sudden onset difficutly with speech with confusion. Administered tPA 06/20/2019 at 1348.   Stroke-like episode s/p tPA in young female with possible spontaneous abortion. Needs to rule out phospholipid syndrome. DDx including TIA, seizure, anxiety  Code Stroke CT head No acute abnormality. ASPECTS 10.     CTA head & neck Unremarkable   CT perfusion negative  MRI pending  2D Echo EF 60-65%. No source of embolus   LE venous doppler pending  LDL 93  HgbA1c 5.5  UDS pending   SCDs for VTE prophylaxis  No antithrombotic prior to admission, now on No antithrombotic as within 24h of tPA administration.   Therapy recommendations:  Pending, ok to be OOB  Disposition:  pending   Hypertensive Urgency  BP  as high as 201/124  Home meds:  Noncompliant with meds prior to admission  BP goal per post tPA protocol x 24h following tPA administration On labetalol 200mg  bid  Long-term BP goal normotensive  Hyperlipidemia  Home meds:  No statin  LDL 93  Will consider statin if imaging positive for stroke   Seizure like activity   Reported PTA at home had episode of eyes rolling back, jerking, no tongue bitting or b/b incontinence  Yesterday had confusion and aphasia  Pt initially refused EEG, but now OK with EEG  EEG  pending  Possible spontaneous abortion  Recent vaginal bleeding but no active bleeding now  Hb 12.0->10.4 -> pending  Close Hb monitoring  Serum hCG qualitative neg  Beta hcG 5 -> pending  HCG quantitative 5.8-> pending  Obstetrics ultrasound "Pregnancy of unknown anatomic location (no intrauterine gestational sac or adnexal mass identified). Fluid or blood throughout the endometrial cavity favors a recent spontaneous abortion, however, IUP too early to visualize, and non-visualized ectopic pregnancy cannot definitely be excluded. Recommend correlation with serial beta-hCG levels, and follow up US if warranted clinically".  Hypercoagulable work up pending to rule out phospholipid syndrome  LE venous doppler pending  Other Stroke Risk Factors    Other Active Problems    Hospital day # 2  This patient is critically ill due to stroke like episode, seizure like activity, possible abortion, hypertensive urgency, s/p tPA and at significant risk of neurological worsening, death form recurrent stroke, hemorrhagic conversion, seizure, status epilepticus, vaginal bleeding. This patient's care requires constant monitoring of vital signs, hemodynamics, respiratory and cardiac monitoring, review of multiple databases, neurological assessment, discussion with family, other specialists and medical decision making of high complexity. I spent 35 minutes of neurocritical care time in the care of this patient.  Rosalin Hawking, MD PhD Stroke Neurology 06/21/2019 11:15 AM  To contact Stroke Continuity provider, please refer to [email or web address] After hours, contact General Neurology

## 2019-06-21 NOTE — CV Procedure (Signed)
Lower extremity venous not completed, Phyllis Hopkins is currently in MRI.  Darlina Sicilian [facility] 06/21/2019 13:15

## 2019-06-21 NOTE — Progress Notes (Signed)
Pt transferred to 3 W 07. Belongings with pt. Mother notified of pts new room. Bedside handoff with Lenis Dickinson.

## 2019-06-21 NOTE — Procedures (Signed)
Patient Name: Phyllis Hopkins  MRN: [id number]  Epilepsy Attending: Lora Havens  Referring Physician/Provider: Dr Rosalin Hawking Date: 06/21/2019 Duration: 24.24mins  Patient history: 43yo F with sudden onset speech disturbance with confusion. EEG to evaluate for seizure  Level of alertness: awake, asleep  AEDs during EEG study: None  Technical aspects: This EEG study was done with scalp electrodes positioned according to the 10-20 International system of electrode placement. Electrical activity was acquired at a sampling rate of 500Hz  and reviewed with a high frequency filter of 70Hz  and a low frequency filter of 1Hz . EEG data were recorded continuously and digitally stored.   DESCRIPTION: The posterior dominant rhythm consists of 9-10 Hz activity of moderate voltage (25-35 uV) seen predominantly in posterior head regions, symmetric and reactive to eye opening and eye closing. Sleep was characterized by vertex waves, sleep spindles (12-14)Hz, maximal frontocentral. Hyperventilation and photic stimulation were not performed.  IMPRESSION: This study is within normal limits. No seizures or epileptiform discharges were seen throughout the recording.  Raeleen Winstanley Barbra Sarks

## 2019-06-21 NOTE — Progress Notes (Signed)
PT Cancellation Note  Patient Details Name: Alaiah Mcmains MRN: [id number] DOB: 1975-11-21   Cancelled Treatment:    Reason Eval/Treat Not Completed: Medical issues which prohibited therapy;Active bedrest order(Pt on bedrest until after 1348.  Will check back tomorrow.)   Denice Paradise 06/21/2019, 7:39 AM  Caitlin Hillmer W,PT Acute Rehabilitation Services Pager:  [phone]  Office:  [phone]

## 2019-06-22 ENCOUNTER — Inpatient Hospital Stay (HOSPITAL_COMMUNITY): Payer: Federal, State, Local not specified - PPO

## 2019-06-22 DIAGNOSIS — E785 Hyperlipidemia, unspecified: Secondary | ICD-10-CM | POA: Diagnosis present

## 2019-06-22 DIAGNOSIS — I639 Cerebral infarction, unspecified: Secondary | ICD-10-CM | POA: Diagnosis not present

## 2019-06-22 DIAGNOSIS — S43004A Unspecified dislocation of right shoulder joint, initial encounter: Secondary | ICD-10-CM | POA: Diagnosis not present

## 2019-06-22 DIAGNOSIS — M25511 Pain in right shoulder: Secondary | ICD-10-CM | POA: Diagnosis not present

## 2019-06-22 DIAGNOSIS — I63412 Cerebral infarction due to embolism of left middle cerebral artery: Secondary | ICD-10-CM | POA: Diagnosis not present

## 2019-06-22 DIAGNOSIS — O039 Complete or unspecified spontaneous abortion without complication: Secondary | ICD-10-CM | POA: Diagnosis present

## 2019-06-22 LAB — BASIC METABOLIC PANEL
Anion gap: 8 (ref 5–15)
BUN: 6 mg/dL (ref 6–20)
CO2: 21 mmol/L — ABNORMAL LOW (ref 22–32)
Calcium: 8 mg/dL — ABNORMAL LOW (ref 8.9–10.3)
Chloride: 108 mmol/L (ref 98–111)
Creatinine, Ser: 0.82 mg/dL (ref 0.44–1.00)
GFR calc Af Amer: >60 mL/min (ref >60)
GFR calc non Af Amer: >60 mL/min (ref >60)
Glucose, Bld: 108 mg/dL — ABNORMAL HIGH (ref 70–99)
Potassium: 3.6 mmol/L (ref 3.5–5.1)
Sodium: 137 mmol/L (ref 135–145)

## 2019-06-22 LAB — CBC
HCT: 30.7 % — ABNORMAL LOW (ref 36.0–46.0)
Hemoglobin: 10.1 g/dL — ABNORMAL LOW (ref 12.0–15.0)
MCH: 30.1 pg (ref 26.0–34.0)
MCHC: 32.9 g/dL (ref 30.0–36.0)
MCV: 91.4 fL (ref 80.0–100.0)
Platelets: 496 10*3/uL — ABNORMAL HIGH (ref 150–400)
RBC: 3.36 MIL/uL — ABNORMAL LOW (ref 3.87–5.11)
RDW: 14.7 % (ref 11.5–15.5)
WBC: 5.5 10*3/uL (ref 4.0–10.5)
nRBC: 0 % (ref 0.0–0.2)

## 2019-06-22 LAB — URINE CULTURE: Culture: NO GROWTH

## 2019-06-22 MED ORDER — AMLODIPINE BESYLATE 5 MG PO TABS
5.0000 mg | ORAL_TABLET | Freq: Every day | ORAL | Status: DC
  Administered 2019-06-22 – 2019-06-25 (×4): 5 mg via ORAL
  Filled 2019-06-22 (×6): qty 1
  Filled 2019-06-22: qty 2
  Filled 2019-06-22 (×2): qty 1

## 2019-06-22 NOTE — Progress Notes (Signed)
   [facility] has been requested to perform a transesophageal echocardiogram on Phyllis Hopkins for stroke.  After careful review of history and examination, the risks and benefits of transesophageal echocardiogram have been explained including risks of esophageal damage, perforation (1:10,000 risk), bleeding, pharyngeal hematoma as well as other potential complications associated with conscious sedation including aspiration, arrhythmia, respiratory failure and death. Alternatives to treatment were discussed, questions were answered. Patient is willing to proceed.   Procedure is scheduled for Monday 06/25/2019 with Dr. Gardiner Rhyme. Will place orders.  Of note, hemoglobin 10.1 today which is stable from yesterday but down from 13.3 on admission. Patient presented with abdominal cramping with associated vaginal bleeding and felt to likely have had spontaneous abortion. Patient reports vaginal bleeding has resolved and no other abnormal bleeding. Will recheck CBC on Sunday to ensure hemoglobin in stable.  Darreld Mclean, PA-C 06/22/2019 11:41 AM

## 2019-06-22 NOTE — Evaluation (Signed)
Occupational Therapy Evaluation Patient Details Name: Phyllis Hopkins MRN: [id number] DOB: 1975/09/04 Today's Date: 06/22/2019    History of Present Illness 44yo female c/o abdominal cramping/vaginal bleeding and reports she had been found with rolling of eyes/foaming at mough/tongue biting; found to be in HTN emergency with BP 201/124. Admitted for workup of potential spontaneous abortion, however on 12/30 she suddenly became aphasic and with AMS, code stroke called and she received tpa on 06/20/19. CTH negative, MRI shows acute small group of infracts in the L parietal and parietal-occipital lobes. She also complains of R shoulder pain, imaging clear of acute fracture. PMH HTN   Clinical Impression   PTA, pt was living with her cousin and was independent and working as a Statistician. Pt currently performing ADLs and functional mobility at Supervision level. Pt with limited ROM at R shoulder impacting her functional performance; initiated education on compensatory techniques for ADLs. Pt requiring increased time for simple cognitive questions but was able to answer each questions correctly. Recommend dc to home once medically stable per physician. Will continue to follow acutely as admitted to facilitate safe dc.     Follow Up Recommendations  Outpatient OT    Equipment Recommendations  None recommended by OT    Recommendations for Other Services PT consult     Precautions / Restrictions Precautions Precautions: Other (comment) Precaution Comments: R shoulder pain Restrictions Weight Bearing Restrictions: No      Mobility Bed Mobility Overal bed mobility: Modified Independent             General bed mobility comments: use of rail  Transfers Overall transfer level: Needs assistance Equipment used: None Transfers: Sit to/from Stand Sit to Stand: Supervision         General transfer comment: Supervision for safety    Balance Overall balance assessment:  No apparent balance deficits (not formally assessed)                                         ADL either performed or assessed with clinical judgement   ADL Overall ADL's : Needs assistance/impaired                                       General ADL Comments: Pt performing ADLs and functional mobility at Supervision level. Initated education on compensatory techniques for ADLs with RUE pain at shoulder.      Vision Baseline Vision/History: (Contacts) Patient Visual Report: No change from baseline Additional Comments: Denies any vision changes     Perception     Praxis      Pertinent Vitals/Pain Pain Assessment: Faces Faces Pain Scale: Hurts whole lot Pain Location: R shoulder pain with movement of limb Pain Descriptors / Indicators: Moaning;Guarding;Grimacing;Sharp Pain Intervention(s): Monitored during session;Limited activity within patient's tolerance;Repositioned     Hand Dominance Left   Extremity/Trunk Assessment Upper Extremity Assessment Upper Extremity Assessment: RUE deficits/detail RUE Deficits / Details: pain and inability to move through full ROM shoulder flexion/abduction without pain, sharp pains with resisted IR/ER in sitting, tender over upper trap, supraspinatus, and levator RUE: Unable to fully assess due to pain RUE Coordination: decreased gross motor   Lower Extremity Assessment Lower Extremity Assessment: Overall WFL for tasks assessed   Cervical / Trunk Assessment Cervical / Trunk Assessment: Normal   Communication  Communication Communication: No difficulties   Cognition Arousal/Alertness: Awake/alert Behavior During Therapy: WFL for tasks assessed/performed Overall Cognitive Status: Within Functional Limits for tasks assessed                                 General Comments: Requiring increased time with cognitive questions but was able to answer correctly. Pt able to name four animals that start  with F and answer simple money management question, but required increased time. Pt recallling PT session this AM as well as details presented by MD. Upon arrival, pt tearul about having to stay the weekend for TEE.   General Comments  Upon arrival, pt tearful about having to stay in the hospital over the weekend    Exercises     Shoulder Instructions      Home Living Family/patient expects to be discharged to:: Private residence Living Arrangements: Other relatives(cousin) Available Help at Discharge: Family;Available PRN/intermittently Type of Home: Other(Comment)(townhouse) Home Access: Stairs to enter Entrance Stairs-Number of Steps: 1 Entrance Stairs-Rails: None Home Layout: Two level Alternate Level Stairs-Number of Steps: 5 steps, landing, 5 steps, landing, then 5 more steps Alternate Level Stairs-Rails: Left(going up) Bathroom Shower/Tub: Teacher, early years/pre: Standard     Home Equipment: None          Prior Functioning/Environment Level of Independence: Independent        Comments: works as a Statistician with [employer]        OT Problem List: Decreased activity tolerance;Decreased safety awareness;Decreased knowledge of use of DME or AE;Decreased knowledge of precautions;Decreased range of motion;Decreased strength      OT Treatment/Interventions: Self-care/ADL training;Therapeutic exercise;Energy conservation;DME and/or AE instruction;Therapeutic activities;Patient/family education    OT Goals(Current goals can be found in the care plan section) Acute Rehab OT Goals Patient Stated Goal: less pain R shoulder, go home before weekend OT Goal Formulation: With patient Time For Goal Achievement: 07/06/19 Potential to Achieve Goals: Good  OT Frequency: Min 2X/week   Barriers to D/C:            Co-evaluation              AM-PAC OT "6 Clicks" Daily Activity     Outcome Measure Help from another person eating meals?: None Help  from another person taking care of personal grooming?: None Help from another person toileting, which includes using toliet, bedpan, or urinal?: None Help from another person bathing (including washing, rinsing, drying)?: None Help from another person to put on and taking off regular upper body clothing?: None Help from another person to put on and taking off regular lower body clothing?: None 6 Click Score: 24   End of Session Nurse Communication: Mobility status  Activity Tolerance: Patient tolerated treatment well Patient left: in chair;with call bell/phone within reach  OT Visit Diagnosis: Unsteadiness on feet (R26.81);Other abnormalities of gait and mobility (R26.89);Muscle weakness (generalized) (M62.81)                Time: 1330-1402 OT Time Calculation (min): 32 min Charges:  OT General Charges $OT Visit: 1 Visit OT Evaluation $OT Eval Low Complexity: 1 Low OT Treatments $Self Care/Home Management : 8-22 mins  Kentaro Alewine MSOT, OTR/L Acute Rehab Pager: [phone] Office: [place] 06/22/2019, 2:08 PM

## 2019-06-22 NOTE — Consult Note (Signed)
Reason for Consult:right shoulder pain Referring Physician: Neuro service  Phyllis Hopkins is an 44 y.o. female.  HPI: 44 yo female admitted for work up of seizure activity with LOC and stroke.  Patient has had recent miscarriage.  She denies previous issues with her right shoulder.  There is concern that during her seizure she suffered trauma to the shoulder. She denies neck or back pain or other extremity pain.   Past Medical History:  Diagnosis Date  . Blood in stool    hx of  . Heart murmur   . Hypertension     History reviewed. No pertinent surgical history.  Family History  Problem Relation Age of Onset  . Hypertension Mother   . Hypertension Father   . Diabetes Father        diet controlled    Social History:  reports that she has never smoked. She has never used smokeless tobacco. She reports that she does not drink alcohol or use drugs.  Allergies: No Known Allergies  Medications: I have reviewed the patient's current medications.  Results for orders placed or performed during the hospital encounter of 06/19/19 (from the past 48 hour(s))  Hemoglobin and hematocrit, blood     Status: Abnormal   Collection Time: 06/21/19  3:04 AM  Result Value Ref Range   Hemoglobin 10.4 (L) 12.0 - 15.0 g/dL   HCT 31.5 (L) 36.0 - 46.0 %    Comment: Performed at [facility], [address], [place], [place] [address]  Hemoglobin A1c     Status: None   Collection Time: 06/21/19  3:04 AM  Result Value Ref Range   Hgb A1c MFr Bld 5.5 4.8 - 5.6 %    Comment: (NOTE) Pre diabetes:          5.7%-6.4% Diabetes:              >6.4% Glycemic control for   <7.0% adults with diabetes    Mean Plasma Glucose 111.15 mg/dL    Comment: Performed at [facility] [address], [place], [place] [address]  Lipid panel     Status: Abnormal   Collection Time: 06/21/19  3:04 AM  Result Value Ref Range   Cholesterol 145 0 - 200 mg/dL   Triglycerides 81 <150 mg/dL   HDL 36 (L)  >40 mg/dL   Total CHOL/HDL Ratio 4.0 RATIO   VLDL 16 0 - 40 mg/dL   LDL Cholesterol 93 0 - 99 mg/dL    Comment:        Total Cholesterol/HDL:CHD Risk Coronary Heart Disease Risk Table                     Men   Women  1/2 Average Risk   3.4   3.3  Average Risk       5.0   4.4  2 X Average Risk   9.6   7.1  3 X Average Risk  23.4   11.0        Use the calculated Patient Ratio above and the CHD Risk Table to determine the patient's CHD Risk.        ATP III CLASSIFICATION (LDL):  <100     mg/dL   Optimal  100-129  mg/dL   Near or Above                    Optimal  130-159  mg/dL   Borderline  160-189  mg/dL  High  >190     mg/dL   Very High Performed at [facility] [address] [place], [place] [address]   Urine rapid drug screen (hosp performed)     Status: Abnormal   Collection Time: 06/21/19  9:59 AM  Result Value Ref Range   Opiates NONE DETECTED NONE DETECTED   Cocaine NONE DETECTED NONE DETECTED   Benzodiazepines NONE DETECTED NONE DETECTED   Amphetamines NONE DETECTED NONE DETECTED   Tetrahydrocannabinol POSITIVE (A) NONE DETECTED   Barbiturates NONE DETECTED NONE DETECTED    Comment: (NOTE) DRUG SCREEN FOR MEDICAL PURPOSES ONLY.  IF CONFIRMATION IS NEEDED FOR ANY PURPOSE, NOTIFY LAB WITHIN 5 DAYS. LOWEST DETECTABLE LIMITS FOR URINE DRUG SCREEN Drug Class                     Cutoff (ng/mL) Amphetamine and metabolites    1000 Barbiturate and metabolites    200 Benzodiazepine                 [id number] Tricyclics and metabolites     300 Opiates and metabolites        300 Cocaine and metabolites        300 THC                            50 Performed at [facility], [place] [address], [place], [place] [address]   Urinalysis, Complete w Microscopic     Status: Abnormal   Collection Time: 06/21/19  9:59 AM  Result Value Ref Range   Color, Urine AMBER (A) YELLOW    Comment: BIOCHEMICALS MAY BE AFFECTED BY COLOR   APPearance CLOUDY (A) CLEAR   Specific  Gravity, Urine 1.030 1.005 - 1.030   pH 6.0 5.0 - 8.0   Glucose, UA NEGATIVE NEGATIVE mg/dL   Hgb urine dipstick LARGE (A) NEGATIVE   Bilirubin Urine NEGATIVE NEGATIVE   Ketones, ur 80 (A) NEGATIVE mg/dL   Protein, ur 100 (A) NEGATIVE mg/dL   Nitrite NEGATIVE NEGATIVE   Leukocytes,Ua SMALL (A) NEGATIVE   RBC / HPF 0-5 0 - 5 RBC/hpf   WBC, UA >50 (H) 0 - 5 WBC/hpf   Bacteria, UA FEW (A) NONE SEEN   Squamous Epithelial / LPF 11-20 0 - 5   Mucus PRESENT     Comment: Performed at [facility], [address], [place], [place] [address]  hCG, quantitative, pregnancy     Status: None   Collection Time: 06/21/19 10:04 AM  Result Value Ref Range   hCG, Beta Chain, Quant, S 2 <5 mIU/mL    Comment:          GEST. AGE      CONC.  (mIU/mL)   <=1 WEEK        5 - 50     2 WEEKS       50 - 500     3 WEEKS       100 - 10,000     4 WEEKS     1,000 - 30,000     5 WEEKS     3,500 - 115,000   6-8 WEEKS     12,000 - 270,000    12 WEEKS     15,000 - 220,000        FEMALE AND NON-PREGNANT FEMALE:     LESS THAN 5 mIU/mL Performed at [facility], [address] [place], [place] [address]   CBC     Status: Abnormal   Collection Time: 06/21/19 10:04 AM  Result Value Ref Range   WBC 6.6 4.0 - 10.5 K/uL   RBC 3.36 (L) 3.87 - 5.11 MIL/uL   Hemoglobin 10.1 (L) 12.0 - 15.0 g/dL   HCT 30.1 (L) 36.0 - 46.0 %   MCV 89.6 80.0 - 100.0 fL   MCH 30.1 26.0 - 34.0 pg   MCHC 33.6 30.0 - 36.0 g/dL   RDW 14.7 11.5 - 15.5 %   Platelets 488 (H) 150 - 400 K/uL   nRBC 0.0 0.0 - 0.2 %    Comment: Performed at [facility], [place] [address], [place], [place] [id number]  Basic metabolic panel     Status: Abnormal   Collection Time: 06/21/19 10:04 AM  Result Value Ref Range   Sodium 137 135 - 145 mmol/L   Potassium 3.0 (L) 3.5 - 5.1 mmol/L   Chloride 107 98 - 111 mmol/L   CO2 21 (L) 22 - 32 mmol/L   Glucose, Bld 92 70 - 99 mg/dL   BUN 7 6 - 20 mg/dL   Creatinine, Ser 0.88 0.44 - 1.00 mg/dL    Calcium 7.8 (L) 8.9 - 10.3 mg/dL   GFR calc non Af Amer >60 >60 mL/min   GFR calc Af Amer >60 >60 mL/min   Anion gap 9 5 - 15    Comment: Performed at [facility], [place] [address], [place], [place] [address]  Urine Culture     Status: None   Collection Time: 06/21/19  4:47 PM   Specimen: Urine, Clean Catch  Result Value Ref Range   Specimen Description URINE, CLEAN CATCH    Special Requests NONE    Culture      NO GROWTH Performed at [facility], [place] [address], [place], [place] [address]    Report Status 06/22/2019 FINAL   CBC     Status: Abnormal   Collection Time: 06/22/19  1:08 AM  Result Value Ref Range   WBC 5.5 4.0 - 10.5 K/uL   RBC 3.36 (L) 3.87 - 5.11 MIL/uL   Hemoglobin 10.1 (L) 12.0 - 15.0 g/dL   HCT 30.7 (L) 36.0 - 46.0 %   MCV 91.4 80.0 - 100.0 fL   MCH 30.1 26.0 - 34.0 pg   MCHC 32.9 30.0 - 36.0 g/dL   RDW 14.7 11.5 - 15.5 %   Platelets 496 (H) 150 - 400 K/uL   nRBC 0.0 0.0 - 0.2 %    Comment: Performed at [facility], [place] [address], [place], [place] [id number]  Basic metabolic panel     Status: Abnormal   Collection Time: 06/22/19  1:08 AM  Result Value Ref Range   Sodium 137 135 - 145 mmol/L   Potassium 3.6 3.5 - 5.1 mmol/L   Chloride 108 98 - 111 mmol/L   CO2 21 (L) 22 - 32 mmol/L   Glucose, Bld 108 (H) 70 - 99 mg/dL   BUN 6 6 - 20 mg/dL   Creatinine, Ser 0.82 0.44 - 1.00 mg/dL   Calcium 8.0 (L) 8.9 - 10.3 mg/dL   GFR calc non Af Amer >60 >60 mL/min   GFR calc Af Amer >60 >60 mL/min   Anion gap 8 5 - 15    Comment: Performed at [facility] [address] [place],  [address]    MR BRAIN  WO CONTRAST  Result Date: 06/21/2019 CLINICAL DATA:  Follow-up stroke with aphasia. Lytic administration. EXAM: MRI HEAD WITHOUT CONTRAST TECHNIQUE: Multiplanar, multiecho pulse sequences of the brain and surrounding structures were obtained without intravenous contrast. COMPARISON:  CT studies done yesterday. FINDINGS: Brain: There  are 4 or 5 punctate foci of acute infarctions scattered within the left posterior parietal and occipital region consistent with micro embolic infarctions. No large or confluent infarction. Elsewhere, brain has normal appearance without evidence of atrophy, old infarction, mass lesion, hemorrhage, hydrocephalus or extra-axial collection. Single old focus of T2 and FLAIR signal within the subcortical white matter of the left parietal lobe. Vascular: Major vessels at the base of the brain show flow. Skull and upper cervical spine: Negative Sinuses/Orbits: Clear/normal Other: None IMPRESSION: Small grouping of small acute embolic infarctions in the left parietal and parieto-occipital brain consistent with micro embolic infarctions in the left MCA territory. No large or confluent infarction. No swelling or hemorrhage. Electronically Signed   By: Nelson Chimes M.D.   On: 06/21/2019 13:56   EEG adult  Result Date: 06/21/2019 Lora Havens, MD     06/21/2019  4:41 PM Patient Name: Phyllis Hopkins MRN: [id number] Epilepsy Attending: Lora Havens Referring Physician/Provider: Dr Rosalin Hawking Date: 06/21/2019 Duration: 24.34mins Patient history: 44yo F with sudden onset speech disturbance with confusion. EEG to evaluate for seizure Level of alertness: awake, asleep AEDs during EEG study: None Technical aspects: This EEG study was done with scalp electrodes positioned according to the 10-20 International system of electrode placement. Electrical activity was acquired at a sampling rate of 500Hz  and reviewed with a high frequency filter of 70Hz  and a low frequency filter of 1Hz . EEG data were recorded continuously and digitally stored. DESCRIPTION: The posterior dominant rhythm consists of 9-10 Hz activity of moderate voltage (25-35 uV) seen predominantly in posterior head regions, symmetric and reactive to eye opening and eye closing. Sleep was characterized by vertex waves, sleep spindles (12-14)Hz, maximal  frontocentral. Hyperventilation and photic stimulation were not performed. IMPRESSION: This study is within normal limits. No seizures or epileptiform discharges were seen throughout the recording. Priyanka O Yadav   VAS [place] LOWER EXTREMITY VENOUS (DVT)  Result Date: 06/22/2019  Lower Venous Study Indications: Stroke.  Comparison Study: no prior Performing Technologist: Abram Sander RVS  Examination Guidelines: A complete evaluation includes B-mode imaging, spectral Doppler, color Doppler, and power Doppler as needed of all accessible portions of each vessel. Bilateral testing is considered an integral part of a complete examination. Limited examinations for reoccurring indications may be performed as noted.  +---------+---------------+---------+-----------+----------+--------------+ RIGHT    CompressibilityPhasicitySpontaneityPropertiesThrombus Aging +---------+---------------+---------+-----------+----------+--------------+ CFV      Full           Yes      Yes                                 +---------+---------------+---------+-----------+----------+--------------+ SFJ      Full                                                        +---------+---------------+---------+-----------+----------+--------------+ FV Prox  Full                                                        +---------+---------------+---------+-----------+----------+--------------+  FV Mid   Full                                                        +---------+---------------+---------+-----------+----------+--------------+ FV DistalFull                                                        +---------+---------------+---------+-----------+----------+--------------+ PFV      Full                                                        +---------+---------------+---------+-----------+----------+--------------+ POP      Full           Yes      Yes                                  +---------+---------------+---------+-----------+----------+--------------+ PTV      Full                                                        +---------+---------------+---------+-----------+----------+--------------+ PERO     Full                                                        +---------+---------------+---------+-----------+----------+--------------+   +---------+---------------+---------+-----------+----------+--------------+ LEFT     CompressibilityPhasicitySpontaneityPropertiesThrombus Aging +---------+---------------+---------+-----------+----------+--------------+ CFV      Full           Yes      Yes                                 +---------+---------------+---------+-----------+----------+--------------+ SFJ      Full                                                        +---------+---------------+---------+-----------+----------+--------------+ FV Prox  Full                                                        +---------+---------------+---------+-----------+----------+--------------+ FV Mid   Full                                                        +---------+---------------+---------+-----------+----------+--------------+  FV DistalFull                                                        +---------+---------------+---------+-----------+----------+--------------+ PFV      Full                                                        +---------+---------------+---------+-----------+----------+--------------+ POP      Full           Yes      Yes                                 +---------+---------------+---------+-----------+----------+--------------+ PTV      Full                                                        +---------+---------------+---------+-----------+----------+--------------+ PERO     Full                                                         +---------+---------------+---------+-----------+----------+--------------+     Summary: Right: There is no evidence of deep vein thrombosis in the lower extremity. No cystic structure found in the popliteal fossa. Left: There is no evidence of deep vein thrombosis in the lower extremity. No cystic structure found in the popliteal fossa.  *See table(s) above for measurements and observations. Electronically signed by Deitra Mayo MD on 06/22/2019 at 1:39:01 PM.    Final     Review of Systems Blood pressure (!) 159/96, pulse 69, temperature 98.4 F (36.9 C), temperature source Oral, resp. rate 16, height 5\' 2"  (1.575 m), weight 70.6 kg, SpO2 99 %. Physical Exam Awake and alert and oriented. Neck is non tender with normal AROM Right shoulder: swollen and tender to palpation. Limited shoulder ROM due to pain. Elbow and wrist on the right are non tender. 5/5 motor bilateral UEs. Sensation intact bilateral UE Left shoulder and arm with normal AROM Bilateral LEs with pain free AROM  Assessment/Plan: S/p likely fall with injury to the right shoulder. Reviewing her plain films there is evidence of a shoulder dislocation with a large Hill Sachs lesion and coracoid tip fracture. Recommend sling immobilizer and no AROM for the shoulder. Limit use of the right shoulder to help with pain and decrease risk of redislocation. MRI ordered to rule out rotator cuff tear and confirm XRAY findings.  Ice to the shoulder for swelling Pain meds per primary team if needed Will follow, thanks for the consult  Augustin Schooling 06/22/2019, 3:52 PM

## 2019-06-22 NOTE — Progress Notes (Signed)
Lower extremity venous has been completed.   Preliminary results in CV Proc.   Abram Sander 06/22/2019 11:57 AM

## 2019-06-22 NOTE — Progress Notes (Signed)
PT Cancellation Note  Patient Details Name: Phyllis Hopkins MRN: [id number] DOB: 1976-05-29   Cancelled Treatment:    Reason Eval/Treat Not Completed: Other (comment) doppler to r/o DVT still pending. Will follow acutely and attempt to return later in day if time/schedule allow and patient is medically appropriate.    Windell Norfolk, DPT, PN1   Supplemental Physical Therapist [facility]    Pager [phone] Acute Rehab Office [phone]

## 2019-06-22 NOTE — Evaluation (Signed)
Physical Therapy Evaluation Patient Details Name: Phyllis Hopkins MRN: [id number] DOB: 1976-05-23 Today's Date: 06/22/2019   History of Present Illness  44yo female c/o abdominal cramping/vaginal bleeding and reports she had been found with rolling of eyes/foaming at [place]/tongue biting; found to be in HTN emergency with BP 201/124. Admitted for workup of potential spontaneous abortion, however on 12/30 she suddenly became aphasic and with AMS, code stroke called and she received tpa on 06/20/19. CTH negative, MRI shows acute small group of infracts in the L parietal and parietal-occipital lobes. She also complains of R shoulder pain, imaging clear of acute fracture. PMH HTN  Clinical Impression   Patient received in bed, pleasant and cooperative but frustrated about staying in the hospital over the weekend. Able to complete bed mobility with Mod(I), functional transfers with S/no device, and gait approximately 162f with min guard/no device. She reports she mostly feels at her baseline for mobility with the exception of moving slightly slower than usual. RAM WNL, and she reports no sensation impairments or vertigo/visual changes. R shoulder screen reveals significant guarding and strength loss- estimated MMT for shoulder flexion 3-/5, abduction 2/5, and ER/IR 2/5 due to pain; she had difficulty tolerating MMT at EOB due to shoulder pain. Supraspinatus, upper trap, and levator groups are all quite tender. Suspect possible soft tissue injury, RN aware. She declines up to chair and was left in bed with all needs met, bed alarm active this morning. Currently recommend skilled therapy services in the neuro OP setting.     Follow Up Recommendations Outpatient PT;Other (comment)(neuro OP PT)    Equipment Recommendations  None recommended by PT    Recommendations for Other Services       Precautions / Restrictions Precautions Precautions: Other (comment) Precaution Comments: R shoulder  pain Restrictions Weight Bearing Restrictions: No      Mobility  Bed Mobility Overal bed mobility: Modified Independent             General bed mobility comments: use of rail  Transfers Overall transfer level: Needs assistance Equipment used: None Transfers: Sit to/from Stand Sit to Stand: Supervision         General transfer comment: S for safety, no physical assist given  Ambulation/Gait Ambulation/Gait assistance: Min guard Gait Distance (Feet): 180 Feet Assistive device: None Gait Pattern/deviations: Step-through pattern;Narrow base of support Gait velocity: decreased   General Gait Details: BOS slightly narrow, no LOB or unsteadiness noted; she reports besides moving more slowly than usual that she is at her baseline for gait  Stairs            Wheelchair Mobility    Modified Rankin (Stroke Patients Only) Modified Rankin (Stroke Patients Only) Pre-Morbid Rankin Score: No symptoms Modified Rankin: No significant disability     Balance Overall balance assessment: No apparent balance deficits (not formally assessed)                                           Pertinent Vitals/Pain Pain Assessment: Faces Faces Pain Scale: Hurts whole lot Pain Location: R shoulder pain with movement of limb Pain Descriptors / Indicators: Moaning;Guarding;Grimacing;Sharp Pain Intervention(s): Limited activity within patient's tolerance;Monitored during session;Relaxation    Home Living Family/patient expects to be discharged to:: Private residence Living Arrangements: Other relatives(cousin) Available Help at Discharge: Family;Available PRN/intermittently Type of Home: Other(Comment)(townhouse) Home Access: Stairs to enter Entrance Stairs-Rails: None Entrance Stairs-Number  of Steps: 1 Home Layout: Two level Home Equipment: None      Prior Function Level of Independence: Independent         Comments: works as a Statistician with  [employer]        Extremity/Trunk Assessment   Upper Extremity Assessment Upper Extremity Assessment: RUE deficits/detail;LUE deficits/detail RUE Deficits / Details: pain and inability to move through full ROM shoulder flexion/abduction without pain, sharp pains with resisted IR/ER in sitting, tender over upper trap, supraspinatus, and levator RUE: Unable to fully assess due to pain    Lower Extremity Assessment Lower Extremity Assessment: Overall WFL for tasks assessed    Cervical / Trunk Assessment Cervical / Trunk Assessment: Normal  Communication   Communication: No difficulties  Cognition Arousal/Alertness: Awake/alert Behavior During Therapy: WFL for tasks assessed/performed Overall Cognitive Status: Within Functional Limits for tasks assessed                                 General Comments: A&Ox4, pleasant and cooperative, just reluctant to stay in hospital over the weekend      General Comments General comments (skin integrity, edema, etc.): RAM WNL, reports no loss of sensation or numbness/tingling, no vertigo or dizziness noted    Exercises     Assessment/Plan    PT Assessment Patient needs continued PT services  PT Problem List Decreased mobility;Decreased coordination;Pain;Decreased strength       PT Treatment Interventions DME instruction;Balance training;Gait training;Neuromuscular re-education;Stair training;Functional mobility training;Patient/family education;Therapeutic activities;Therapeutic exercise    PT Goals (Current goals can be found in the Care Plan section)  Acute Rehab PT Goals Patient Stated Goal: less pain R shoulder, go home before weekend PT Goal Formulation: With patient Time For Goal Achievement: 07/06/19 Potential to Achieve Goals: Good    Frequency Min 3X/week   Barriers to discharge        Co-evaluation               AM-PAC PT "6 Clicks" Mobility  Outcome Measure Help needed  turning from your back to your side while in a flat bed without using bedrails?: None Help needed moving from lying on your back to sitting on the side of a flat bed without using bedrails?: None Help needed moving to and from a bed to a chair (including a wheelchair)?: None Help needed standing up from a chair using your arms (e.g., wheelchair or bedside chair)?: None Help needed to walk in hospital room?: A Little Help needed climbing 3-5 steps with a railing? : A Little 6 Click Score: 22    End of Session   Activity Tolerance: Patient tolerated treatment well Patient left: in bed;with call bell/phone within reach;with bed alarm set(refused up to chair) Nurse Communication: Mobility status;Other (comment)(would consider ortho eval of R shoulder, suspect possible soft tissue injury) PT Visit Diagnosis: Pain;Other abnormalities of gait and mobility (R26.89) Pain - Right/Left: Right Pain - part of body: Shoulder    Time: 1194-1740 PT Time Calculation (min) (ACUTE ONLY): 19 min   Charges:   PT Evaluation $PT Eval Moderate Complexity: 1 Mod          Windell Norfolk, DPT, PN1   Supplemental Physical Therapist [facility]    Pager [phone] Acute Rehab Office [phone]
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
## 2019-06-23 ENCOUNTER — Inpatient Hospital Stay (HOSPITAL_COMMUNITY): Payer: Federal, State, Local not specified - PPO

## 2019-06-23 DIAGNOSIS — I63412 Cerebral infarction due to embolism of left middle cerebral artery: Secondary | ICD-10-CM | POA: Diagnosis not present

## 2019-06-23 DIAGNOSIS — S42131A Displaced fracture of coracoid process, right shoulder, initial encounter for closed fracture: Secondary | ICD-10-CM | POA: Diagnosis not present

## 2019-06-23 LAB — BASIC METABOLIC PANEL
Anion gap: 9 (ref 5–15)
BUN: 6 mg/dL (ref 6–20)
CO2: 23 mmol/L (ref 22–32)
Calcium: 8.3 mg/dL — ABNORMAL LOW (ref 8.9–10.3)
Chloride: 108 mmol/L (ref 98–111)
Creatinine, Ser: 0.81 mg/dL (ref 0.44–1.00)
GFR calc Af Amer: >60 mL/min (ref >60)
GFR calc non Af Amer: >60 mL/min (ref >60)
Glucose, Bld: 102 mg/dL — ABNORMAL HIGH (ref 70–99)
Potassium: 3.7 mmol/L (ref 3.5–5.1)
Sodium: 140 mmol/L (ref 135–145)

## 2019-06-23 LAB — CBC
HCT: 30 % — ABNORMAL LOW (ref 36.0–46.0)
Hemoglobin: 9.8 g/dL — ABNORMAL LOW (ref 12.0–15.0)
MCH: 29.8 pg (ref 26.0–34.0)
MCHC: 32.7 g/dL (ref 30.0–36.0)
MCV: 91.2 fL (ref 80.0–100.0)
Platelets: 510 10*3/uL — ABNORMAL HIGH (ref 150–400)
RBC: 3.29 MIL/uL — ABNORMAL LOW (ref 3.87–5.11)
RDW: 14.7 % (ref 11.5–15.5)
WBC: 5.1 10*3/uL (ref 4.0–10.5)
nRBC: 0 % (ref 0.0–0.2)

## 2019-06-23 LAB — HOMOCYSTEINE: Homocysteine: 10.4 umol/L (ref 0.0–14.5)

## 2019-06-23 LAB — BETA HCG QUANT (REF LAB): hCG Quant: <1 m[IU]/mL

## 2019-06-23 NOTE — Progress Notes (Signed)
Patient ID: Phyllis Hopkins, female   DOB: 01/19/1976, 44 y.o.   MRN: [id number]  MRI shoulder pending Will follow and await results

## 2019-06-23 NOTE — Evaluation (Signed)
Speech Language Pathology Evaluation Patient Details Name: Phyllis Hopkins MRN: [id number] DOB: 1975/10/10 Today's Date: 06/23/2019 Time: [id number] SLP Time Calculation (min) (ACUTE ONLY): 24 min  Problem List:  Patient Active Problem List   Diagnosis Date Noted  . Hyperlipidemia 06/22/2019  . Possible Abortion, spontaneous 06/22/2019  . Cerebral embolism with cerebral infarction ([place]) L MCA territory s/p tPA 06/20/2019  . Syncope   . Vaginal bleeding in pregnancy   . Hypertensive urgency 06/19/2019  . Left arm pain 05/19/2017  . HTN (hypertension) 09/30/2010  . COLITIS, HX OF 08/28/2010   Past Medical History:  Past Medical History:  Diagnosis Date  . Blood in stool    hx of  . Heart murmur   . Hypertension    Past Surgical History: History reviewed. No pertinent surgical history. HPI:  44yo female c/o abdominal cramping/vaginal bleeding and reports she had been found with rolling of eyes/foaming at mouth/tongue biting; found to be in HTN emergency with BP 201/124. Admitted for workup of potential spontaneous abortion, however on 12/30 she suddenly became aphasic and with AMS, code stroke called and she received tpa on 06/20/19. CTH negative, MRI shows acute small group of infracts in the L parietal and parietal-occipital lobes. She also complains of R shoulder pain, imaging clear of acute fracture. PMH HTN.    Assessment / Plan / Recommendation Clinical Impression  Pt was seen for a cognitive-linguistic evaluation in the setting of acute small L parietal and parietal-occipital infarcts.  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  [place] Cognitive Assessment in addition to informal evaluation measures.  She scored overall 29/30, indicating functional cognitive-linguistic abilities (norm >/=26/30).  No expressive or receptive language deficits were observed and pt's speech was  100% intelligible to an unfamiliar listener.  Pt reported that she observed speech/language changes on 12/30; however, she stated that she is back at her cognitive-linguistic baseline.  No further skilled ST is warranted at this level of care.  Since pt works full time, she may benefit from an outpatient speech therapy evaluation to complete a more intensive cognitive-linguistic evaluation.  Spoke with pt regarding all recommendations and she verbalized understanding.  Please re-consult if additional needs arise.      SLP Assessment  SLP Recommendation/Assessment: Patient does not need any further Speech Lanaguage Pathology Services SLP Visit Diagnosis: Cognitive communication deficit (R41.841)    Follow Up Recommendations       Frequency and Duration           SLP Evaluation Cognition  Overall Cognitive Status: Within Functional Limits for tasks assessed Arousal/Alertness: Awake/alert Orientation Level: Oriented X4 Attention: Sustained;Alternating Sustained Attention: Appears intact Alternating Attention: Appears intact Memory: Appears intact Awareness: Appears intact Problem Solving: Appears intact Executive Function: Reasoning;Organizing Reasoning: Appears intact Organizing: Appears intact Safety/Judgment: Appears intact       Comprehension  Auditory Comprehension Overall Auditory Comprehension: Appears within functional limits for tasks assessed Yes/No Questions: Within Functional Limits Commands: Within Functional Limits Conversation: Complex    Expression Expression Primary Mode of Expression: Verbal Verbal Expression Overall Verbal Expression: Appears within functional limits for tasks assessed Initiation: No impairment Level of Generative/Spontaneous Verbalization: Conversation Repetition: No impairment Naming: No impairment Pragmatics: No impairment Written Expression Dominant Hand: Left   Oral / Motor  Oral Motor/Sensory Function Overall Oral Motor/Sensory  Function: Within functional limits Motor Speech Overall Motor Speech: Appears within functional limits for  tasks assessed                      Colin Mulders M.S., [place] Office: [phone]  [place] 06/23/2019, 11:23 AM
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
## 2019-06-24 DIAGNOSIS — I63412 Cerebral infarction due to embolism of left middle cerebral artery: Secondary | ICD-10-CM | POA: Diagnosis not present

## 2019-06-24 LAB — BASIC METABOLIC PANEL
Anion gap: 9 (ref 5–15)
BUN: 6 mg/dL (ref 6–20)
CO2: 22 mmol/L (ref 22–32)
Calcium: 8.6 mg/dL — ABNORMAL LOW (ref 8.9–10.3)
Chloride: 109 mmol/L (ref 98–111)
Creatinine, Ser: 0.78 mg/dL (ref 0.44–1.00)
GFR calc Af Amer: >60 mL/min (ref >60)
GFR calc non Af Amer: >60 mL/min (ref >60)
Glucose, Bld: 101 mg/dL — ABNORMAL HIGH (ref 70–99)
Potassium: 4 mmol/L (ref 3.5–5.1)
Sodium: 140 mmol/L (ref 135–145)

## 2019-06-24 LAB — CBC
HCT: 30.5 % — ABNORMAL LOW (ref 36.0–46.0)
Hemoglobin: 10 g/dL — ABNORMAL LOW (ref 12.0–15.0)
MCH: 30 pg (ref 26.0–34.0)
MCHC: 32.8 g/dL (ref 30.0–36.0)
MCV: 91.6 fL (ref 80.0–100.0)
Platelets: 543 10*3/uL — ABNORMAL HIGH (ref 150–400)
RBC: 3.33 MIL/uL — ABNORMAL LOW (ref 3.87–5.11)
RDW: 14.7 % (ref 11.5–15.5)
WBC: 4.4 10*3/uL (ref 4.0–10.5)
nRBC: 0 % (ref 0.0–0.2)

## 2019-06-24 NOTE — Progress Notes (Signed)
Orthopedic Tech Progress Note Patient Details:  Phyllis Hopkins 28-Mar-1976 [id number]  Ortho Devices Type of Ortho Device: Shoulder immobilizer Ortho Device/Splint Interventions: Application   Post Interventions Patient Tolerated: Well Instructions Provided: Care of device   [place] Pink 06/24/2019, 11:59 AM
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
## 2019-06-24 NOTE — Progress Notes (Signed)
   Subjective:  Patient reports pain as mild.  Shoulder pain is minimal.  It is only increased with any motion.  She does not have a sling just yet.  She is awaiting a TEE tomorrow.  Objective:   VITALS:   Vitals:   06/23/19 1933 06/24/19 0001 06/24/19 0358 06/24/19 0809  BP: (!) 142/88 (!) 164/101 (!) 151/101 (!) 148/94  Pulse: 74 78 68 62  Resp: 17 17 16 16   Temp: 98.3 F (36.8 C) 98.3 F (36.8 C) 98.4 F (36.9 C) 98.5 F (36.9 C)  TempSrc: Oral Oral Oral Oral  SpO2: 99% 95% 100% 98%  Weight:      Height:        Neurovascular intact Sensation intact distally No neurologic deficits in the right upper extremity.  Lab Results  Component Value Date   WBC 4.4 06/24/2019   HGB 10.0 (L) 06/24/2019   HCT 30.5 (L) 06/24/2019   MCV 91.6 06/24/2019   PLT 543 (H) 06/24/2019   BMET    Component Value Date/Time   NA 140 06/24/2019 0545   K 4.0 06/24/2019 0545   CL 109 06/24/2019 0545   CO2 22 06/24/2019 0545   GLUCOSE 101 (H) 06/24/2019 0545   BUN 6 06/24/2019 0545   CREATININE 0.78 06/24/2019 0545   CREATININE 0.72 11/27/2013 1157   CALCIUM 8.6 (L) 06/24/2019 0545   GFRNONAA >60 06/24/2019 0545   GFRAA >60 06/24/2019 0545     Assessment/Plan:     Principal Problem:   Cerebral embolism with cerebral infarction ([place]) L MCA territory s/p tPA Active Problems:   Hypertensive urgency   Syncope   Vaginal bleeding in pregnancy   Hyperlipidemia   Possible Abortion, spontaneous   -MRI reviewed with the patient.  She does have findings consistent with anterior dislocation.  Will defer to Dr. Veverly Fells for definitive management there.  At this time she is appropriate for sling for comfort and no active range of motion for 10 to 14 days.  -She should follow-up with Dr. Veverly Fells in 10 to 14 days for discussion of treatment plan.  Nicholes Stairs 06/24/2019, 9:31 AM   Geralynn Rile, MD [phone]
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
## 2019-06-25 ENCOUNTER — Inpatient Hospital Stay (HOSPITAL_COMMUNITY): Payer: Federal, State, Local not specified - PPO

## 2019-06-25 ENCOUNTER — Encounter (HOSPITAL_COMMUNITY): Payer: Self-pay | Admitting: Student in an Organized Health Care Education/Training Program

## 2019-06-25 ENCOUNTER — Encounter (HOSPITAL_COMMUNITY)
Admission: EM | Disposition: A | Discharge status: Home / Self Care | Payer: Self-pay | Source: Home / Self Care | Attending: Neurology

## 2019-06-25 DIAGNOSIS — I639 Cerebral infarction, unspecified: Secondary | ICD-10-CM

## 2019-06-25 DIAGNOSIS — I6310 Cerebral infarction due to embolism of unspecified precerebral artery: Secondary | ICD-10-CM | POA: Diagnosis not present

## 2019-06-25 DIAGNOSIS — S43016A Anterior dislocation of unspecified humerus, initial encounter: Secondary | ICD-10-CM | POA: Diagnosis present

## 2019-06-25 HISTORY — PX: BUBBLE STUDY: SHX6837

## 2019-06-25 HISTORY — PX: TEE WITHOUT CARDIOVERSION: SHX5443

## 2019-06-25 LAB — CBC
HCT: 31.6 % — ABNORMAL LOW (ref 36.0–46.0)
Hemoglobin: 10.3 g/dL — ABNORMAL LOW (ref 12.0–15.0)
MCH: 30.1 pg (ref 26.0–34.0)
MCHC: 32.6 g/dL (ref 30.0–36.0)
MCV: 92.4 fL (ref 80.0–100.0)
Platelets: 592 10*3/uL — ABNORMAL HIGH (ref 150–400)
RBC: 3.42 MIL/uL — ABNORMAL LOW (ref 3.87–5.11)
RDW: 14.7 % (ref 11.5–15.5)
WBC: 5.3 10*3/uL (ref 4.0–10.5)
nRBC: 0 % (ref 0.0–0.2)

## 2019-06-25 LAB — BASIC METABOLIC PANEL
Anion gap: 9 (ref 5–15)
BUN: 8 mg/dL (ref 6–20)
CO2: 24 mmol/L (ref 22–32)
Calcium: 8.7 mg/dL — ABNORMAL LOW (ref 8.9–10.3)
Chloride: 107 mmol/L (ref 98–111)
Creatinine, Ser: 0.88 mg/dL (ref 0.44–1.00)
GFR calc Af Amer: >60 mL/min (ref >60)
GFR calc non Af Amer: >60 mL/min (ref >60)
Glucose, Bld: 101 mg/dL — ABNORMAL HIGH (ref 70–99)
Potassium: 3.8 mmol/L (ref 3.5–5.1)
Sodium: 140 mmol/L (ref 135–145)

## 2019-06-25 LAB — BETA-2-GLYCOPROTEIN I ABS, IGG/M/A
Beta-2 Glyco I IgG: 65 GPI IgG units — ABNORMAL HIGH (ref 0–20)
Beta-2-Glycoprotein I IgA: <9 GPI IgA units (ref 0–25)
Beta-2-Glycoprotein I IgM: 11 GPI IgM units (ref 0–32)

## 2019-06-25 SURGERY — ECHOCARDIOGRAM, TRANSESOPHAGEAL
Anesthesia: Moderate Sedation

## 2019-06-25 MED ORDER — MIDAZOLAM HCL (PF) 10 MG/2ML IJ SOLN
INTRAMUSCULAR | Status: DC | PRN
  Administered 2019-06-25 (×2): 2 mg via INTRAVENOUS
  Administered 2019-06-25: 1 mg via INTRAVENOUS

## 2019-06-25 MED ORDER — SODIUM CHLORIDE 0.9 % IV SOLN
INTRAVENOUS | Status: DC
  Administered 2019-06-25: 20 mL/h via INTRAVENOUS

## 2019-06-25 MED ORDER — ASPIRIN 81 MG PO TBEC: 81.0000 mg | DELAYED_RELEASE_TABLET | Freq: Every day | ORAL | Status: AC

## 2019-06-25 MED ORDER — CLOPIDOGREL BISULFATE 75 MG PO TABS: 75.0000 mg | ORAL_TABLET | Freq: Every day | ORAL | 0 refills | Status: DC

## 2019-06-25 MED ORDER — FENTANYL CITRATE (PF) 100 MCG/2ML IJ SOLN
INTRAMUSCULAR | Status: DC | PRN
  Administered 2019-06-25 (×4): 25 ug via INTRAVENOUS

## 2019-06-25 MED ORDER — MIDAZOLAM HCL (PF) 5 MG/ML IJ SOLN
INTRAMUSCULAR | Status: AC
  Filled 2019-06-25: qty 2

## 2019-06-25 MED ORDER — FENTANYL CITRATE (PF) 100 MCG/2ML IJ SOLN
INTRAMUSCULAR | Status: AC
  Filled 2019-06-25: qty 2

## 2019-06-25 MED ORDER — ATORVASTATIN CALCIUM 40 MG PO TABS: 40.0000 mg | ORAL_TABLET | Freq: Every day | ORAL | 2 refills | Status: DC

## 2019-06-25 MED ORDER — AMLODIPINE BESYLATE 5 MG PO TABS: 5.0000 mg | ORAL_TABLET | Freq: Every day | ORAL | 2 refills | Status: DC

## 2019-06-25 MED ORDER — BUTAMBEN-TETRACAINE-BENZOCAINE 2-2-14 % EX AERO
INHALATION_SPRAY | CUTANEOUS | Status: DC | PRN
  Administered 2019-06-25: 2 via TOPICAL
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
## 2019-06-25 NOTE — Discharge Summary (Addendum)
Stroke Discharge Summary  Patient ID: Phyllis Hopkins   MRN: [id number]      DOB: 24-Aug-1975  Date of Admission: 06/19/2019 Date of Discharge: 06/25/2019  Attending Physician:  Garvin Fila, MD, Stroke MD Consultant(s):   Dr. Veverly Fells orthopedic surgery  Patient's PCP:  Debbrah Alar, NP  DISCHARGE DIAGNOSIS:  Principal Problem:   Cerebral embolism with cerebral infarction ([place]) L MCA territory s/p tPA Active Problems:   Hypertensive urgency   Syncope   Vaginal bleeding in pregnancy   Hyperlipidemia   Possible Abortion, spontaneous   Anterior shoulder dislocation, right   Allergies as of 06/25/2019   No Known Allergies      Medication List     STOP taking these medications    cephALEXin 500 MG capsule Commonly known as: Keflex   diclofenac 75 MG EC tablet Commonly known as: VOLTAREN   HYDROcodone-acetaminophen 5-325 MG tablet Commonly known as: Norco   naproxen 500 MG tablet Commonly known as: NAPROSYN       TAKE these medications    amLODipine 5 MG tablet Commonly known as: NORVASC Take 1 tablet (5 mg total) by mouth daily. Start taking on: June 26, 2019   aspirin 81 MG EC tablet Take 1 tablet (81 mg total) by mouth daily. Start taking on: June 26, 2019   atorvastatin 40 MG tablet Commonly known as: LIPITOR Take 1 tablet (40 mg total) by mouth daily at 6 PM.   clopidogrel 75 MG tablet Commonly known as: PLAVIX Take 1 tablet (75 mg total) by mouth daily. Start taking on: June 26, 2019   labetalol 200 MG tablet Commonly known as: NORMODYNE Take 1 tablet (200 mg total) by mouth 2 (two) times daily. What changed: medication strength        LABORATORY STUDIES CBC    Component Value Date/Time   WBC 5.3 06/25/2019 0242   RBC 3.42 (L) 06/25/2019 0242   HGB 10.3 (L) 06/25/2019 0242   HCT 31.6 (L) 06/25/2019 0242   PLT 592 (H) 06/25/2019 0242   MCV 92.4 06/25/2019 0242   MCH 30.1 06/25/2019 0242   MCHC 32.6 06/25/2019  0242   RDW 14.7 06/25/2019 0242   LYMPHSABS 2.0 06/20/2019 1136   MONOABS 1.0 06/20/2019 1136   EOSABS 0.0 06/20/2019 1136   BASOSABS 0.0 06/20/2019 1136   CMP    Component Value Date/Time   NA 140 06/25/2019 0242   K 3.8 06/25/2019 0242   CL 107 06/25/2019 0242   CO2 24 06/25/2019 0242   GLUCOSE 101 (H) 06/25/2019 0242   BUN 8 06/25/2019 0242   CREATININE 0.88 06/25/2019 0242   CREATININE 0.72 11/27/2013 1157   CALCIUM 8.7 (L) 06/25/2019 0242   PROT 7.9 06/19/2019 1546   ALBUMIN 3.9 06/19/2019 1546   AST 21 06/19/2019 1546   ALT 12 06/19/2019 1546   ALKPHOS 54 06/19/2019 1546   BILITOT 0.8 06/19/2019 1546   GFRNONAA >60 06/25/2019 0242   GFRAA >60 06/25/2019 0242   COAGSNo results found for: INR, PROTIME Lipid Panel    Component Value Date/Time   CHOL 145 06/21/2019 0304   TRIG 81 06/21/2019 0304   HDL 36 (L) 06/21/2019 0304   CHOLHDL 4.0 06/21/2019 0304   VLDL 16 06/21/2019 0304   LDLCALC 93 06/21/2019 0304   HgbA1C  Lab Results  Component Value Date   HGBA1C 5.5 06/21/2019   Urinalysis    Component Value Date/Time   COLORURINE AMBER (A) 06/21/2019 [id number]  APPEARANCEUR CLOUDY (A) 06/21/2019 0959   LABSPEC 1.030 06/21/2019 0959   PHURINE 6.0 06/21/2019 0959   GLUCOSEU NEGATIVE 06/21/2019 0959   HGBUR LARGE (A) 06/21/2019 0959   BILIRUBINUR NEGATIVE 06/21/2019 0959   KETONESUR 80 (A) 06/21/2019 0959   PROTEINUR 100 (A) 06/21/2019 0959   NITRITE NEGATIVE 06/21/2019 0959   LEUKOCYTESUR SMALL (A) 06/21/2019 0959   Urine Drug Screen     Component Value Date/Time   LABOPIA NONE DETECTED 06/21/2019 0959   COCAINSCRNUR NONE DETECTED 06/21/2019 0959   LABBENZ NONE DETECTED 06/21/2019 0959   AMPHETMU NONE DETECTED 06/21/2019 0959   THCU POSITIVE (A) 06/21/2019 0959   LABBARB NONE DETECTED 06/21/2019 0959    Alcohol Level    Component Value Date/Time   ETH <10 06/19/2019 1546    SIGNIFICANT DIAGNOSTIC STUDIES CT ANGIO HEAD W OR WO CONTRAST  Result  Date: 06/20/2019 CLINICAL DATA:  Stroke follow-up. EXAM: CT ANGIOGRAPHY HEAD AND NECK CT PERFUSION BRAIN TECHNIQUE: Multidetector CT imaging of the head and neck was performed using the standard protocol during bolus administration of intravenous contrast. Multiplanar CT image reconstructions and MIPs were obtained to evaluate the vascular anatomy. Carotid stenosis measurements (when applicable) are obtained utilizing NASCET criteria, using the distal internal carotid diameter as the denominator. Multiphase CT imaging of the brain was performed following IV bolus contrast injection. Subsequent parametric perfusion maps were calculated using RAPID software. CONTRAST:  139mL OMNIPAQUE IOHEXOL 350 MG/ML SOLN COMPARISON:  None. FINDINGS: CTA NECK FINDINGS Aortic arch: Normal variant aortic arch branching pattern with common origin of the brachiocephalic and left common carotid arteries. Widely patent arch vessel origins. Right carotid system: Patent without evidence of stenosis or dissection. Left carotid system: Patent without evidence of stenosis or dissection. Vertebral arteries: Patent and codominant without evidence of stenosis or dissection. Skeleton: No acute osseous abnormality or suspicious osseous lesion. Other neck: No evidence of cervical lymphadenopathy or mass. Upper chest: Clear lung apices. Review of the MIP images confirms the above findings CTA HEAD FINDINGS Anterior circulation: The internal carotid arteries are widely patent from skull base to carotid termini. ACAs and MCAs are patent without evidence of proximal branch occlusion or significant proximal stenosis. No aneurysm is identified. Posterior circulation: The intracranial vertebral arteries are widely patent to the basilar. Patent right PICA, left AICA, and bilateral SCA origins are identified. The basilar artery is widely patent. Posterior communicating arteries are diminutive or absent. PCAs are patent without evidence of significant  proximal stenosis. No aneurysm is identified. Venous sinuses: As permitted by contrast timing, patent. Hypoplastic left transverse and sigmoid sinuses. Anatomic variants: None. Review of the MIP images confirms the above findings CT Brain Perfusion Findings: ASPECTS: 10 CBF (<30%) Volume: 67mL Perfusion (Tmax>6.0s) volume: 5mL IMPRESSION: 1. No large vessel occlusion or significant stenosis in the head or neck. 2. Negative CTP. These results were communicated to Dr. Rory Percy at 2:14 pm on 06/20/2019 by text page via the [facility] messaging system. Electronically Signed   By: Logan Bores M.D.   On: 06/20/2019 14:25   DG Shoulder Right  Result Date: 06/20/2019 CLINICAL DATA:  Right shoulder pain EXAM: RIGHT SHOULDER - 2+ VIEW COMPARISON:  None. FINDINGS: There is no evidence of fracture or dislocation. There is no evidence of arthropathy or other focal bone abnormality. Soft tissues are unremarkable. IMPRESSION: No acute abnormality noted. Electronically Signed   By: Inez Catalina M.D.   On: 06/20/2019 13:43   CT Head Wo Contrast  Result Date: 06/19/2019 CLINICAL DATA:  Encephalopathy.  Foaming at the mouth. EXAM: CT HEAD WITHOUT CONTRAST TECHNIQUE: Contiguous axial images were obtained from the base of the skull through the vertex without intravenous contrast. COMPARISON:  None. FINDINGS: Brain: No evidence of acute infarction, hemorrhage, hydrocephalus, extra-axial collection or mass lesion/mass effect. Vascular: No hyperdense vessel or unexpected calcification. Skull: No osseous abnormality. Sinuses/Orbits: Visualized paranasal sinuses are clear. Visualized mastoid sinuses are clear. Visualized orbits demonstrate no focal abnormality. Other: None IMPRESSION: No acute intracranial pathology. Electronically Signed   By: Kathreen Devoid   On: 06/19/2019 20:02   CT ANGIO NECK W OR WO CONTRAST  Result Date: 06/20/2019 CLINICAL DATA:  Stroke follow-up. EXAM: CT ANGIOGRAPHY HEAD AND NECK CT PERFUSION BRAIN TECHNIQUE:  Multidetector CT imaging of the head and neck was performed using the standard protocol during bolus administration of intravenous contrast. Multiplanar CT image reconstructions and MIPs were obtained to evaluate the vascular anatomy. Carotid stenosis measurements (when applicable) are obtained utilizing NASCET criteria, using the distal internal carotid diameter as the denominator. Multiphase CT imaging of the brain was performed following IV bolus contrast injection. Subsequent parametric perfusion maps were calculated using RAPID software. CONTRAST:  124mL OMNIPAQUE IOHEXOL 350 MG/ML SOLN COMPARISON:  None. FINDINGS: CTA NECK FINDINGS Aortic arch: Normal variant aortic arch branching pattern with common origin of the brachiocephalic and left common carotid arteries. Widely patent arch vessel origins. Right carotid system: Patent without evidence of stenosis or dissection. Left carotid system: Patent without evidence of stenosis or dissection. Vertebral arteries: Patent and codominant without evidence of stenosis or dissection. Skeleton: No acute osseous abnormality or suspicious osseous lesion. Other neck: No evidence of cervical lymphadenopathy or mass. Upper chest: Clear lung apices. Review of the MIP images confirms the above findings CTA HEAD FINDINGS Anterior circulation: The internal carotid arteries are widely patent from skull base to carotid termini. ACAs and MCAs are patent without evidence of proximal branch occlusion or significant proximal stenosis. No aneurysm is identified. Posterior circulation: The intracranial vertebral arteries are widely patent to the basilar. Patent right PICA, left AICA, and bilateral SCA origins are identified. The basilar artery is widely patent. Posterior communicating arteries are diminutive or absent. PCAs are patent without evidence of significant proximal stenosis. No aneurysm is identified. Venous sinuses: As permitted by contrast timing, patent. Hypoplastic left  transverse and sigmoid sinuses. Anatomic variants: None. Review of the MIP images confirms the above findings CT Brain Perfusion Findings: ASPECTS: 10 CBF (<30%) Volume: 71mL Perfusion (Tmax>6.0s) volume: 26mL IMPRESSION: 1. No large vessel occlusion or significant stenosis in the head or neck. 2. Negative CTP. These results were communicated to Dr. Rory Percy at 2:14 pm on 06/20/2019 by text page via the [facility] messaging system. Electronically Signed   By: Logan Bores M.D.   On: 06/20/2019 14:25   MR BRAIN WO CONTRAST  Result Date: 06/21/2019 CLINICAL DATA:  Follow-up stroke with aphasia. Lytic administration. EXAM: MRI HEAD WITHOUT CONTRAST TECHNIQUE: Multiplanar, multiecho pulse sequences of the brain and surrounding structures were obtained without intravenous contrast. COMPARISON:  CT studies done yesterday. FINDINGS: Brain: There are 4 or 5 punctate foci of acute infarctions scattered within the left posterior parietal and occipital region consistent with micro embolic infarctions. No large or confluent infarction. Elsewhere, brain has normal appearance without evidence of atrophy, old infarction, mass lesion, hemorrhage, hydrocephalus or extra-axial collection. Single old focus of T2 and FLAIR signal within the subcortical white matter of the left parietal lobe. Vascular: Major vessels at the base of the brain  show flow. Skull and upper cervical spine: Negative Sinuses/Orbits: Clear/normal Other: None IMPRESSION: Small grouping of small acute embolic infarctions in the left parietal and parieto-occipital brain consistent with micro embolic infarctions in the left MCA territory. No large or confluent infarction. No swelling or hemorrhage. Electronically Signed   By: Nelson Chimes M.D.   On: 06/21/2019 13:56   US OB Comp Less 14 Wks  Result Date: 06/19/2019 CLINICAL DATA:  Vaginal bleeding in 1st trimester pregnancy. EXAM: OBSTETRIC <14 WK [place] AND TRANSVAGINAL OB US TECHNIQUE: Both transabdominal and  transvaginal ultrasound examinations were performed for complete evaluation of the gestation as well as the maternal uterus, adnexal regions, and pelvic cul-de-sac. Transvaginal technique was performed to assess early pregnancy. COMPARISON:  None. FINDINGS: Intrauterine gestational sac: None Maternal uterus/adnexae: Fluid or blood is seen throughout the endometrial cavity. No fibroids identified. Both ovaries are normal in appearance. No adnexal mass or abnormal free fluid identified. IMPRESSION: Pregnancy of unknown anatomic location (no intrauterine gestational sac or adnexal mass identified). Fluid or blood throughout the endometrial cavity favors a recent spontaneous abortion, however, IUP too early to visualize, and non-visualized ectopic pregnancy cannot definitely be excluded. Recommend correlation with serial beta-hCG levels, and follow up US if warranted clinically. Electronically Signed   By: Marlaine Hind M.D.   On: 06/19/2019 18:30   US OB Transvaginal  Result Date: 06/19/2019 CLINICAL DATA:  Vaginal bleeding in 1st trimester pregnancy. EXAM: OBSTETRIC <14 WK [place] AND TRANSVAGINAL OB US TECHNIQUE: Both transabdominal and transvaginal ultrasound examinations were performed for complete evaluation of the gestation as well as the maternal uterus, adnexal regions, and pelvic cul-de-sac. Transvaginal technique was performed to assess early pregnancy. COMPARISON:  None. FINDINGS: Intrauterine gestational sac: None Maternal uterus/adnexae: Fluid or blood is seen throughout the endometrial cavity. No fibroids identified. Both ovaries are normal in appearance. No adnexal mass or abnormal free fluid identified. IMPRESSION: Pregnancy of unknown anatomic location (no intrauterine gestational sac or adnexal mass identified). Fluid or blood throughout the endometrial cavity favors a recent spontaneous abortion, however, IUP too early to visualize, and non-visualized ectopic pregnancy cannot definitely be excluded.  Recommend correlation with serial beta-hCG levels, and follow up US if warranted clinically. Electronically Signed   By: Marlaine Hind M.D.   On: 06/19/2019 18:30   MR SHOULDER RIGHT WO CONTRAST  Result Date: 06/23/2019 CLINICAL DATA:  Seizure, shoulder pain EXAM: MRI OF THE RIGHT SHOULDER WITHOUT CONTRAST TECHNIQUE: Multiplanar, multisequence MR imaging of the shoulder was performed. No intravenous contrast was administered. COMPARISON:  X-ray 06/20/2019 FINDINGS: Rotator cuff: Rotator cuff tendons are grossly intact. Slight intermediate signal within the supraspinatus, infraspinatus, and subscapularis tendons likely posttraumatic rather than reflective of tendinosis. Muscles: Intramuscular edema most pronounced within the subscapularis muscle. No atrophy or fatty infiltration. Biceps long head:  Intact. Acromioclavicular Joint: Normal acromioclavicular joint. Trace subacromial-subdeltoid bursal fluid. Glenohumeral Joint: Moderate-sized complex joint effusion, likely hemarthrosis. No discrete chondral defect. Labrum: Bankart lesion of the anteroinferior labrum with extensive stripping of the periosteum of the anterior aspect of the scapula (series 3, images 14-15). Anterior-inferior labrum is at least partially adherent to this medially displaced labroligamentous complex. The anterior band of the inferior glenohumeral ligament appears torn. No definite superior labral tear. Bones: Large impaction fracture of the posterosuperior aspect of the humeral head with extensive bone marrow edema. The coracoid process of the scapula is fractured and displaced anterolaterally by approximately 1.0 cm (series 3, image 10-11). Humeral head is well positioned within the glenoid without  dislocation. Other: There is prominent pericapsular soft tissue edema, likely posttraumatic. There is also mild intramuscular edema within the lateral deltoid. IMPRESSION: 1. Sequela of recent anterior glenohumeral joint dislocation with acute  Hill-Sachs impaction fracture and complex soft tissue Bankart injury. 2. Additional acute moderately displaced fracture of the coracoid process of the scapula. 3. Moderate-sized complex joint effusion, likely hemarthrosis. 4. Intramuscular edema within the subscapularis and lateral deltoid muscles, likely posttraumatic. 5. No rotator cuff tear. Electronically Signed   By: Davina Poke D.O.   On: 06/23/2019 15:55   CT CEREBRAL PERFUSION W CONTRAST  Result Date: 06/20/2019 CLINICAL DATA:  Stroke follow-up. EXAM: CT ANGIOGRAPHY HEAD AND NECK CT PERFUSION BRAIN TECHNIQUE: Multidetector CT imaging of the head and neck was performed using the standard protocol during bolus administration of intravenous contrast. Multiplanar CT image reconstructions and MIPs were obtained to evaluate the vascular anatomy. Carotid stenosis measurements (when applicable) are obtained utilizing NASCET criteria, using the distal internal carotid diameter as the denominator. Multiphase CT imaging of the brain was performed following IV bolus contrast injection. Subsequent parametric perfusion maps were calculated using RAPID software. CONTRAST:  148mL OMNIPAQUE IOHEXOL 350 MG/ML SOLN COMPARISON:  None. FINDINGS: CTA NECK FINDINGS Aortic arch: Normal variant aortic arch branching pattern with common origin of the brachiocephalic and left common carotid arteries. Widely patent arch vessel origins. Right carotid system: Patent without evidence of stenosis or dissection. Left carotid system: Patent without evidence of stenosis or dissection. Vertebral arteries: Patent and codominant without evidence of stenosis or dissection. Skeleton: No acute osseous abnormality or suspicious osseous lesion. Other neck: No evidence of cervical lymphadenopathy or mass. Upper chest: Clear lung apices. Review of the MIP images confirms the above findings CTA HEAD FINDINGS Anterior circulation: The internal carotid arteries are widely patent from skull base  to carotid termini. ACAs and MCAs are patent without evidence of proximal branch occlusion or significant proximal stenosis. No aneurysm is identified. Posterior circulation: The intracranial vertebral arteries are widely patent to the basilar. Patent right PICA, left AICA, and bilateral SCA origins are identified. The basilar artery is widely patent. Posterior communicating arteries are diminutive or absent. PCAs are patent without evidence of significant proximal stenosis. No aneurysm is identified. Venous sinuses: As permitted by contrast timing, patent. Hypoplastic left transverse and sigmoid sinuses. Anatomic variants: None. Review of the MIP images confirms the above findings CT Brain Perfusion Findings: ASPECTS: 10 CBF (<30%) Volume: 63mL Perfusion (Tmax>6.0s) volume: 57mL IMPRESSION: 1. No large vessel occlusion or significant stenosis in the head or neck. 2. Negative CTP. These results were communicated to Dr. Rory Percy at 2:14 pm on 06/20/2019 by text page via the [facility] messaging system. Electronically Signed   By: Logan Bores M.D.   On: 06/20/2019 14:25   EEG adult  Result Date: 06/21/2019 Lora Havens, MD     06/21/2019  4:41 PM Patient Name: Phyllis Hopkins MRN: [id number] Epilepsy Attending: Lora Havens Referring Physician/Provider: Dr Rosalin Hawking Date: 06/21/2019 Duration: 24.68mins Patient history: 44yo F with sudden onset speech disturbance with confusion. EEG to evaluate for seizure Level of alertness: awake, asleep AEDs during EEG study: None Technical aspects: This EEG study was done with scalp electrodes positioned according to the 10-20 International system of electrode placement. Electrical activity was acquired at a sampling rate of 500Hz  and reviewed with a high frequency filter of 70Hz  and a low frequency filter of 1Hz . EEG data were recorded continuously and digitally stored. DESCRIPTION: The posterior dominant rhythm consists  of 9-10 Hz activity of moderate voltage (25-35  uV) seen predominantly in posterior head regions, symmetric and reactive to eye opening and eye closing. Sleep was characterized by vertex waves, sleep spindles (12-14)Hz, maximal frontocentral. Hyperventilation and photic stimulation were not performed. IMPRESSION: This study is within normal limits. No seizures or epileptiform discharges were seen throughout the recording. Lora Havens   ECHOCARDIOGRAM COMPLETE  Result Date: 06/20/2019   ECHOCARDIOGRAM REPORT   Patient Name:   Phyllis Hopkins Date of Exam: 06/20/2019 Medical Rec #:  [id number]             Height:       62.0 in Accession #:    [id number]            Weight:       155.7 lb Date of Birth:  07/19/1975             BSA:          1.72 m Patient Age:    65 years              BP:           183/114 mmHg Patient Gender: F                     HR:           75 bpm. Exam Location:  Inpatient Procedure: 2D Echo, Color Doppler and Cardiac Doppler Indications:    Stroke  History:        Patient has no prior history of Echocardiogram examinations.                 Signs/Symptoms:Murmur; Risk Factors:Hypertension. Pregnant at                 time of study.  Sonographer:    Raquel Sarna Senior RDCS Referring Phys: [place]  1. Left ventricular ejection fraction, by visual estimation, is 60 to 65%. The left ventricle has normal function. There is no left ventricular hypertrophy.  2. The left ventricle has no regional wall motion abnormalities.  3. Global right ventricle has normal systolic function.The right ventricular size is normal. No increase in right ventricular wall thickness.  4. Left atrial size was normal.  5. Right atrial size was normal.  6. Presence of pericardial fat pad.  7. Trivial pericardial effusion is present.  8. The mitral valve is grossly normal. Trivial mitral valve regurgitation.  9. The tricuspid valve is grossly normal. 10. The aortic valve is tricuspid. Aortic valve regurgitation is not visualized. No evidence of  aortic valve sclerosis or stenosis. 11. The pulmonic valve was grossly normal. Pulmonic valve regurgitation is trivial. 12. TR signal is inadequate for assessing pulmonary artery systolic pressure. 13. The inferior vena cava is normal in size with greater than 50% respiratory variability, suggesting right atrial pressure of 3 mmHg. 14. No prior Echocardiogram. FINDINGS  Left Ventricle: Left ventricular ejection fraction, by visual estimation, is 60 to 65%. The left ventricle has normal function. The left ventricle has no regional wall motion abnormalities. The left ventricular internal cavity size was the left ventricle is normal in size. There is no left ventricular hypertrophy. Left ventricular diastolic parameters were normal. Normal left atrial pressure. Right Ventricle: The right ventricular size is normal. No increase in right ventricular wall thickness. Global RV systolic function is has normal systolic function. Left Atrium: Left atrial size was normal in size. Right Atrium: Right atrial size  was normal in size Pericardium: Trivial pericardial effusion is present. Presence of pericardial fat pad. Mitral Valve: The mitral valve is grossly normal. Trivial mitral valve regurgitation. Tricuspid Valve: The tricuspid valve is grossly normal. Tricuspid valve regurgitation is trivial. Aortic Valve: The aortic valve is tricuspid. Aortic valve regurgitation is not visualized. The aortic valve is structurally normal, with no evidence of sclerosis or stenosis. Pulmonic Valve: The pulmonic valve was grossly normal. Pulmonic valve regurgitation is trivial. Pulmonic regurgitation is trivial. Aorta: The aortic root and ascending aorta are structurally normal, with no evidence of dilitation. Venous: The inferior vena cava is normal in size with greater than 50% respiratory variability, suggesting right atrial pressure of 3 mmHg. IAS/Shunts: No atrial level shunt detected by color flow Doppler.  LEFT VENTRICLE PLAX 2D LVIDd:          4.00 cm  Diastology LVIDs:         2.80 cm  LV e' lateral:   11.10 cm/s LV PW:         1.00 cm  LV E/e' lateral: 8.0 LV IVS:        1.00 cm  LV e' medial:    7.40 cm/s LVOT diam:     1.70 cm  LV E/e' medial:  12.0 LV SV:         40 ml LV SV Index:   22.73 LVOT Area:     2.27 cm  RIGHT VENTRICLE RV S prime:     11.00 cm/s TAPSE (M-mode): 2.0 cm LEFT ATRIUM             Index       RIGHT ATRIUM           Index LA diam:        2.90 cm 1.69 cm/m  RA Area:     11.40 cm LA Vol (A2C):   28.0 ml 16.29 ml/m RA Volume:   19.30 ml  11.23 ml/m LA Vol (A4C):   29.6 ml 17.22 ml/m LA Biplane Vol: 31.3 ml 18.21 ml/m  AORTIC VALVE LVOT Vmax:   73.10 cm/s LVOT Vmean:  46.200 cm/s LVOT VTI:    0.144 m  AORTA Ao Root diam: 2.40 cm Ao Asc diam:  2.80 cm MITRAL VALVE MV Area (PHT): 3.85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

## 2019-06-25 NOTE — Progress Notes (Signed)
Patient discharged home. Education and information provided to patient. IV removed. CCMD notified. All belongings with patient. Leaving unit via wheelchair.

## 2019-06-25 NOTE — Progress Notes (Signed)
*  PRELIMINARY RESULTS* Echocardiogram Echocardiogram Transesophageal has been performed.  Phyllis Hopkins 06/25/2019, 10:39 AM

## 2019-06-25 NOTE — Progress Notes (Signed)
Physical Therapy Treatment Patient Details Name: Phyllis Hopkins MRN: [id number] DOB: [birth year] Today's Date: 06/25/2019    History of Present Illness 44yo female c/o abdominal cramping/vaginal bleeding and reports she had been found with rolling of eyes/foaming at [place]/tongue biting; found to be in HTN emergency with BP 201/124. Admitted for workup of potential spontaneous abortion, however on 12/30 she suddenly became aphasic and with AMS, code stroke called and she received tpa on 06/20/19. CTH negative, MRI shows acute small group of infracts in the L parietal and parietal-occipital lobes. She also complains of R shoulder pain, imaging clear of acute fracture. PMH HTN    PT Comments    Pt progressing with functional mobility today, able to tolerate increased ambulation distance without support. Pt performed bed mobility independently, ambulated x 150 ft without a device and with supervision for safety, stand pivot to the recliner at end of session with supervision. Pt is close to baseline for gait aside from decreased overall gait speed. No follow up PT recommended.    Follow Up Recommendations  No PT follow up     Equipment Recommendations  None recommended by PT    Recommendations for Other Services       Precautions / Restrictions Precautions Precautions: Other (comment) Precaution Comments: R shoulder sling Required Braces or Orthoses: Sling Restrictions Weight Bearing Restrictions: No    Mobility  Bed Mobility Overal bed mobility: Modified Independent       Transfers Overall transfer level: Needs assistance Equipment used: None Transfers: Sit to/from Stand Sit to Stand: Supervision         General transfer comment: Supervision for safety  Ambulation/Gait Ambulation/Gait assistance: Supervision Gait Distance (Feet): 150 Feet Assistive device: IV Pole Gait Pattern/deviations: Step-through pattern Gait velocity: decreased   General Gait Details: no  LOB or unsteadiness noted; she reports besides moving more slowly than usual that she is at her baseline for gait   Stairs             Wheelchair Mobility    Modified Rankin (Stroke Patients Only) Modified Rankin (Stroke Patients Only) Pre-Morbid Rankin Score: No symptoms Modified Rankin: No significant disability     Balance Overall balance assessment: Mild deficits observed, not formally tested                                          Cognition Arousal/Alertness: Awake/alert Behavior During Therapy: WFL for tasks assessed/performed Overall Cognitive Status: Within Functional Limits for tasks assessed                                        Exercises      General Comments        Pertinent Vitals/Pain Pain Assessment: No/denies pain Faces Pain Scale: No hurt    Home Living                      Prior Function            PT Goals (current goals can now be found in the care plan section) Acute Rehab PT Goals Patient Stated Goal: less pain R shoulder Progress towards PT goals: Progressing toward goals    Frequency    Min 3X/week      PT Plan Current plan remains appropriate  Co-evaluation              AM-PAC PT "6 Clicks" Mobility   Outcome Measure  Help needed turning from your back to your side while in a flat bed without using bedrails?: None Help needed moving from lying on your back to sitting on the side of a flat bed without using bedrails?: None Help needed moving to and from a bed to a chair (including a wheelchair)?: None Help needed standing up from a chair using your arms (e.g., wheelchair or bedside chair)?: A Little Help needed to walk in hospital room?: A Little Help needed climbing 3-5 steps with a railing? : A Little 6 Click Score: 21    End of Session   Activity Tolerance: Patient tolerated treatment well Patient left: in chair;with chair alarm set;with call bell/phone within  reach Nurse Communication: Mobility status PT Visit Diagnosis: Other abnormalities of gait and mobility (R26.89)     Time: [id number] PT Time Calculation (min) (ACUTE ONLY): 18 min  Charges:  $Therapeutic Activity: 8-22 mins                     Netta Corrigan, PT, DPT, CSRS Acute Rehab Office [place] 06/25/2019, 4:26 PM

## 2019-06-25 NOTE — Interval H&P Note (Signed)
History and Physical Interval Note:  06/25/2019 9:41 AM  Phyllis Hopkins  has presented today for surgery, with the diagnosis of stroke.  The various methods of treatment have been discussed with the patient and family. After consideration of risks, benefits and other options for treatment, the patient has consented to  Procedure(s): TRANSESOPHAGEAL ECHOCARDIOGRAM (TEE) (N/A) as a surgical intervention.  The patient's history has been reviewed, patient examined, no change in status, stable for surgery.  I have reviewed the patient's chart and labs.  Questions were answered to the patient's satisfaction.     Donato Heinz

## 2019-06-25 NOTE — CV Procedure (Signed)
    TRANSESOPHAGEAL ECHOCARDIOGRAM   NAME:  Phyllis Hopkins   MRN: [id number] DOB:  06/18/1976   ADMIT DATE: 06/19/2019  INDICATIONS: CVA  PROCEDURE:   Informed consent was obtained prior to the procedure. The risks, benefits and alternatives for the procedure were discussed and the patient comprehended these risks.  Risks include, but are not limited to, cough, sore throat, vomiting, nausea, somnolence, esophageal and stomach trauma or perforation, bleeding, low blood pressure, aspiration, pneumonia, infection, trauma to the teeth and death.    Procedural time out performed. The oropharynx was anesthetized with topical 1% benzocaine.    During this procedure the patient is administered a total of Versed 5 mg and Fentanyl 100 mcg to achieve and maintain moderate conscious sedation.  The patient's heart rate, blood pressure, and oxygen saturation are monitored continuously during the procedure. The period of conscious sedation is 23 minutes, of which I was present face-to-face 100% of this time.   The transesophageal probe was inserted in the esophagus and stomach without difficulty and multiple views were obtained.    COMPLICATIONS:    There were no immediate complications.  FINDINGS:  LEFT VENTRICLE: EF = 60-65%. No regional wall motion abnormalities.  RIGHT VENTRICLE: Normal size and function.   LEFT ATRIUM: No thrombus/mass.  LEFT ATRIAL APPENDAGE: No thrombus/mass.   RIGHT ATRIUM: No thrombus/mass.  AORTIC VALVE:  Trileaflet. Trivial regurgitation. No vegetation.  MITRAL VALVE:    Normal structure. Trivial regurgitation. No vegetation.  TRICUSPID VALVE: Normal structure. Trivial regurgitation. No vegetation.  PULMONIC VALVE: Grossly normal structure. No regurgitation. No apparent vegetation.  INTERATRIAL SEPTUM: No PFO or ASD seen by color Doppler.  PERICARDIUM: No effusion noted.  DESCENDING AORTA: Normal size   CONCLUSION: No thrombus, mass, or  vegetation seen.  No evidence of intracardiac shunt on bubble study.    Oswaldo Milian MD [place]  [address], [place], [place] [address] [phone]   10:46 AM

## 2019-06-25 NOTE — Progress Notes (Signed)
Occupational Therapy Treatment Patient Details Name: Phyllis Hopkins MRN: [id number] DOB: 21-Jul-1975 Today's Date: 06/25/2019    History of present illness 44yo female c/o abdominal cramping/vaginal bleeding and reports she had been found with rolling of eyes/foaming at [place]/tongue biting; found to be in HTN emergency with BP 201/124. Admitted for workup of potential spontaneous abortion, however on 12/30 she suddenly became aphasic and with AMS, code stroke called and she received tpa on 06/20/19. CTH negative, MRI shows acute small group of infracts in the L parietal and parietal-occipital lobes. She also complains of R shoulder pain, imaging clear of acute fracture. PMH HTN   OT comments  Pt supine in bed and sleeping soundly but agreeable to OT intervention. OT reviewed sling use for comfort, how to wear, and how to don properly. OT also demonstrating how pt is to perform bathing and dressing tasks with removal of sling and maintaining no active ROM per ortho note for the next 10-14 days. Pt verbalized understanding and able to simulate demonstration. Pt returning to bed at end of session secondary to fatigue from procedure this morning. All needs within reach and outpatient OT remains appropriate at discharge.   Follow Up Recommendations  Outpatient OT    Equipment Recommendations  None recommended by OT    Recommendations for Other Services      Precautions / Restrictions Precautions Precaution Comments: R shoulder pain Restrictions Weight Bearing Restrictions: No       Mobility Bed Mobility Overal bed mobility: Modified Independent      Balance Overall balance assessment: No apparent balance deficits (not formally assessed)        ADL either performed or assessed with clinical judgement   ADL Overall ADL's : Needs assistance/impaired    Upper Body Bathing: Minimal assistance   Lower Body Bathing: Minimal assistance   Upper Body Dressing : Minimal assistance    Lower Body Dressing: Minimal assistance       General ADL Comments: OT providing education on how to wear shoulder sling properly and how to perform UB bathing and dressing tasks while maintaining no active ROM per ortho order.     Vision Patient Visual Report: No change from baseline            Cognition Arousal/Alertness: Awake/alert Behavior During Therapy: WFL for tasks assessed/performed Overall Cognitive Status: Within Functional Limits for tasks assessed                      Pertinent Vitals/ Pain       Pain Assessment: No/denies pain Faces Pain Scale: No hurt         Frequency  Min 2X/week        Progress Toward Goals  OT Goals(current goals can now be found in the care plan section)  Progress towards OT goals: Progressing toward goals  Acute Rehab OT Goals Patient Stated Goal: less pain R shoulder OT Goal Formulation: With patient Time For Goal Achievement: 07/06/19 Potential to Achieve Goals: Good  Plan Discharge plan remains appropriate       AM-PAC OT "6 Clicks" Daily Activity     Outcome Measure   Help from another person eating meals?: None Help from another person taking care of personal grooming?: None Help from another person toileting, which includes using toliet, bedpan, or urinal?: A Little Help from another person bathing (including washing, rinsing, drying)?: A Little Help from another person to put on and taking off regular upper body clothing?: A Little  Help from another person to put on and taking off regular lower body clothing?: A Little 6 Click Score: 20    End of Session Equipment Utilized During Treatment: Other (comment)(shoulder sling)  OT Visit Diagnosis: Unsteadiness on feet (R26.81);Other abnormalities of gait and mobility (R26.89);Muscle weakness (generalized) (M62.81)   Activity Tolerance Patient tolerated treatment well   Patient Left in chair;with call bell/phone within reach   Nurse Communication  Precautions        Time: [id number] OT Time Calculation (min): 14 min  Charges: OT General Charges $OT Visit: 1 Visit OT Treatments $Self Care/Home Management : 8-22 mins  Gypsy Decant MS, OTR/L 06/25/2019, 2:23 PM

## 2019-06-25 NOTE — TOC Transition Note (Signed)
Transition of Care [facility]) - CM/SW Discharge Note   Patient Details  Name: Cherelle Midkiff MRN: [id number] Date of Birth: 1976-02-19  [facility] CM/SW Contact:  Pollie Friar, RN Phone Number: 06/25/2019, 1:35 PM   Clinical Narrative:    CM met with the patient to go over outpatient therapy. Decision made to attend [facility]. Orders in Epic and information on the AVS. CM provided her resources for inpatient/ outpatient counseling for marijuana.  Pt plans on staying with her parents at d/c. She has transportation home when medically ready.   Final next level of care: OP Rehab Barriers to Discharge: No Barriers Identified   Patient Goals and CMS Choice     Choice offered to / list presented to : Patient  Discharge Placement                       Discharge Plan and Services                                     Social Determinants of Health (SDOH) Interventions     Readmission Risk Interventions No flowsheet data found.

## 2019-06-26 ENCOUNTER — Telehealth: Payer: Self-pay | Admitting: Family

## 2019-06-26 NOTE — Telephone Encounter (Signed)
Please contact pt to schedule a 2 week hospital follow up with me.  Virtual is OK.

## 2019-06-27 LAB — CARDIOLIPIN ANTIBODIES, IGG, IGM, IGA
Anticardiolipin IgA: <9 APL U/mL (ref 0–11)
Anticardiolipin IgG: 66 GPL U/mL — ABNORMAL HIGH (ref 0–14)
Anticardiolipin IgM: <9 MPL U/mL (ref 0–12)

## 2019-06-27 NOTE — Telephone Encounter (Signed)
Called patient to schedule 2 week follow up. No answer LM for pt to give [place] a call to schedule this appointment.

## 2019-06-28 DIAGNOSIS — M25511 Pain in right shoulder: Secondary | ICD-10-CM | POA: Diagnosis not present

## 2019-06-28 DIAGNOSIS — S43004D Unspecified dislocation of right shoulder joint, subsequent encounter: Secondary | ICD-10-CM | POA: Diagnosis not present

## 2019-06-28 DIAGNOSIS — S42254D Nondisplaced fracture of greater tuberosity of right humerus, subsequent encounter for fracture with routine healing: Secondary | ICD-10-CM | POA: Diagnosis not present

## 2019-06-28 NOTE — Telephone Encounter (Signed)
Talked to patient and follow up scheduled for 07-06-2019

## 2019-07-04 ENCOUNTER — Telehealth: Payer: Self-pay

## 2019-07-04 NOTE — Telephone Encounter (Addendum)
Called pt; VM left stating I was calling to set up a lab appt and will call once more this afternoon.  Called pt at [phone].

## 2019-07-05 ENCOUNTER — Other Ambulatory Visit: Payer: Self-pay

## 2019-07-05 NOTE — Telephone Encounter (Signed)
Called pt; VM left stating pt needs to call the office to make an appt for labs.

## 2019-07-06 ENCOUNTER — Encounter: Payer: Self-pay | Admitting: Family

## 2019-07-06 ENCOUNTER — Ambulatory Visit: Payer: Federal, State, Local not specified - PPO | Admitting: Family

## 2019-07-06 VITALS — BP 132/88 | HR 74 | Temp 97.5°F | Resp 16 | Ht 62.0 in | Wt 158.0 lb

## 2019-07-06 DIAGNOSIS — O039 Complete or unspecified spontaneous abortion without complication: Secondary | ICD-10-CM | POA: Diagnosis not present

## 2019-07-06 DIAGNOSIS — I10 Essential (primary) hypertension: Secondary | ICD-10-CM | POA: Diagnosis not present

## 2019-07-06 DIAGNOSIS — I639 Cerebral infarction, unspecified: Secondary | ICD-10-CM | POA: Diagnosis not present

## 2019-07-06 DIAGNOSIS — S4291XA Fracture of right shoulder girdle, part unspecified, initial encounter for closed fracture: Secondary | ICD-10-CM | POA: Diagnosis not present

## 2019-07-06 NOTE — Progress Notes (Signed)
Subjective:    Patient ID: Phyllis Hopkins, female    DOB: [birth year], 44 y.o.   MRN: [id number]  HPI  Patient is a 44 yr old female who presents today for follow up. She was admitted to the hospital on 06/19/19 with c/o abdominal cramping/vaginal bleeding,+ HCG and hypertensive urgency (BP was 201/124). HPI and discharge summary are reviewed. She also reported that prior to admission she was found with her eyes rolling back, foaming at the mouth and some tongue biting. Family members called 911.  They lowered her to the floor and she dislocated her right shoulder at that time. She reports that she is following with ortho for her shoulder. seeing Pat Patrick PA-C with Dr. Veverly Fells at [place].   EEG- 06/21/19 negative  CT head was negative for CVA (12/30)  MRI 06/21/19  Noted:   Small grouping of small acute embolic infarctions in the leftparietal and parieto-occipital brain consistent with micro embolic infarctions in the left MCA territory. No large or confluent infarction. No swelling or hemorrhage.   Review of Systems See HPI  Past Medical History:  Diagnosis Date  . Blood in stool    hx of  . Heart murmur   . Hypertension      Social History   Socioeconomic History  . Marital status: Divorced    Spouse name: Not on file  . Number of children: Not on file  . Years of education: Not on file  . Highest education level: Not on file  Occupational History  . Occupation: respiratory therapistory therapist    Comment: [employer]  Tobacco Use  . Smoking status: Never Smoker  . Smokeless tobacco: Never Used  Substance and Sexual Activity  . Alcohol use: No  . Drug use: No  . Sexual activity: Not on file  Other Topics Concern  . Not on file  Social History Narrative   Divorced lives w/ cousin   Regular exercise; yes   Social Determinants of Health   Financial Resource Strain:   . Difficulty of Paying Living Expenses: Not on file  Food Insecurity:   .  Worried About Charity fundraiser in the Last Year: Not on file  . Ran Out of Food in the Last Year: Not on file  Transportation Needs:   . Lack of Transportation (Medical): Not on file  . Lack of Transportation (Non-Medical): Not on file  Physical Activity:   . Days of Exercise per Week: Not on file  . Minutes of Exercise per Session: Not on file  Stress:   . Feeling of Stress : Not on file  Social Connections:   . Frequency of Communication with Friends and Family: Not on file  . Frequency of Social Gatherings with Friends and Family: Not on file  . Attends Religious Services: Not on file  . Active Member of Clubs or Organizations: Not on file  . Attends Archivist Meetings: Not on file  . Marital Status: Not on file  Intimate Partner Violence:   . Fear of Current or Ex-Partner: Not on file  . Emotionally Abused: Not on file  . Physically Abused: Not on file  . Sexually Abused: Not on file    Past Surgical History:  Procedure Laterality Date  . BUBBLE STUDY  06/25/2019   Procedure: BUBBLE STUDY;  Surgeon: Donato Heinz, MD;  Location: [place];  Service: Endoscopy;;  . TEE WITHOUT CARDIOVERSION N/A 06/25/2019   Procedure: TRANSESOPHAGEAL ECHOCARDIOGRAM (TEE);  Surgeon: Oswaldo Milian  L, MD;  Location: [place] [facility];  Service: Endoscopy;  Laterality: N/A;    Family History  Problem Relation Age of Onset  . Hypertension Mother   . Hypertension Father   . Diabetes Father        diet controlled    No Known Allergies  Current Outpatient Medications on File Prior to Visit  Medication Sig Dispense Refill  . amLODipine (NORVASC) 5 MG tablet Take 1 tablet (5 mg total) by mouth daily. 30 tablet 2  . aspirin EC 81 MG EC tablet Take 1 tablet (81 mg total) by mouth daily.    Marland Kitchen atorvastatin (LIPITOR) 40 MG tablet Take 1 tablet (40 mg total) by mouth daily at 6 PM. 30 tablet 2  . clopidogrel (PLAVIX) 75 MG tablet Take 1 tablet (75 mg total) by mouth daily.  21 tablet 0  . labetalol (NORMODYNE) 200 MG tablet Take 1 tablet (200 mg total) by mouth 2 (two) times daily. 60 tablet 2   No current facility-administered medications on file prior to visit.    BP 132/88 (BP Location: Left Arm, Patient Position: Sitting, Cuff Size: Small)   Pulse 74   Temp (!) 97.5 F (36.4 C) (Temporal)   Resp 16   Ht 5\' 2"  (1.575 m)   Wt 158 lb (71.7 kg)   SpO2 96%   BMI 28.90 kg/m       Objective:   Physical Exam Constitutional:      Appearance: She is well-developed.  Neck:     Thyroid: No thyromegaly.  Cardiovascular:     Rate and Rhythm: Normal rate and regular rhythm.     Heart sounds: Normal heart sounds. No murmur.  Pulmonary:     Effort: Pulmonary effort is normal. No respiratory distress.     Breath sounds: Normal breath sounds. No wheezing.  Musculoskeletal:     Cervical back: Neck supple.  Skin:    General: Skin is warm and dry.  Neurological:     Mental Status: She is alert and oriented to person, place, and time.  Psychiatric:        Behavior: Behavior normal.        Thought Content: Thought content normal.        Judgment: Judgment normal.           Assessment & Plan:  CVA- no neuro deficit noted today on exam. Discussed importance of secondary stroke prevention including ASA/Plavix, bp control and lipid management.  HTN- current bp looks good. Continue amlodipine/labetalol. We discussed risks of untreated BP and urged ongoing compliance.  Miscarriage- she does not desire to become pregnant again.  I have advised her to follow up with her GYN to discuss estrogen free contaceptive options such as mirena IUD.   Right shoulder dislocation- has shoulder in sling. Management per ortho.   This visit occurred during the SARS-CoV-2 public health emergency.  Safety protocols were in place, including screening questions prior to the visit, additional usage of staff PPE, and extensive cleaning of exam room while observing appropriate  contact time as indicated for disinfecting solutions.

## 2019-07-13 ENCOUNTER — Encounter: Payer: Self-pay | Admitting: Obstetrics and Gynecology

## 2019-07-13 ENCOUNTER — Other Ambulatory Visit: Payer: Self-pay

## 2019-07-13 ENCOUNTER — Telehealth (INDEPENDENT_AMBULATORY_CARE_PROVIDER_SITE_OTHER): Payer: Federal, State, Local not specified - PPO | Admitting: Obstetrics and Gynecology

## 2019-07-13 DIAGNOSIS — O039 Complete or unspecified spontaneous abortion without complication: Secondary | ICD-10-CM

## 2019-07-13 DIAGNOSIS — I639 Cerebral infarction, unspecified: Secondary | ICD-10-CM

## 2019-07-13 DIAGNOSIS — Z3009 Encounter for other general counseling and advice on contraception: Secondary | ICD-10-CM

## 2019-07-13 NOTE — Progress Notes (Signed)
I connected with  Phyllis Hopkins on 07/13/19 at  9:55 AM EST by [facility] encounter and verified that I am speaking with the correct person using two identifiers.   I discussed the limitations, risks, security and privacy concerns of performing an evaluation and management service by telephone and the availability of in person appointments. I also discussed with the patient that there may be a patient responsible charge related to this service. The patient expressed understanding and agreed to proceed.  Derinda Late, RN 07/13/2019  9:58 AM

## 2019-07-13 NOTE — Progress Notes (Signed)
TELEHEALTH GYNECOLOGY VIRTUAL VIDEO VISIT ENCOUNTER NOTE  Provider location: [facility] at [place]   I connected with Phyllis Hopkins on 07/13/19 at  9:55 AM EST by MyChart Video Encounter at home and verified that I am speaking with the correct person using two identifiers.   I discussed the limitations, risks, security and privacy concerns of performing an evaluation and management service virtually and the availability of in person appointments. I also discussed with the patient that there may be a patient responsible charge related to this service. The patient expressed understanding and agreed to proceed.   History:  Phyllis Hopkins is a 44 y.o. G31P0010 female being evaluated today for f/u for SAB. She denies any abnormal vaginal discharge, bleeding, pelvic pain or other concerns.  Pt had positive pregnancy test at home 4 days prior to 06/19/19. Had bad cramping and started having bleeding and figured she was having a miscarriage. During this time, she had a "fainting" episode at home (that seemed suspicious for seizure), family member called 40 and she dislocated her shoulder while falling during this episode. Went to ED and then had stroke while in ED. Was in hospital total of 6 days. Had issues with language/speech for about a day but that improved and she reports she is no longer having any residual stroke issues. Does have ongoing shoulder pain.  Had positive pregnancy test in ED, then repeat test was negative. US showed blood in uterus. Has not had bleeding or cramping since. She is following up today for contraception counseling.    Past Medical History:  Diagnosis Date  . Blood in stool    hx of  . Heart murmur   . Hypertension    Past Surgical History:  Procedure Laterality Date  . BUBBLE STUDY  06/25/2019   Procedure: BUBBLE STUDY;  Surgeon: Donato Heinz, MD;  Location: [place];  Service: Endoscopy;;  . TEE WITHOUT  CARDIOVERSION N/A 06/25/2019   Procedure: TRANSESOPHAGEAL ECHOCARDIOGRAM (TEE);  Surgeon: Donato Heinz, MD;  Location: [facility];  Service: Endoscopy;  Laterality: N/A;   The following portions of the patient's history were reviewed and updated as appropriate: allergies, current medications, past family history, past medical history, past social history, past surgical history and problem list.   Health Maintenance:  Normal pap and negative HRHPV on unknown.  Normal mammogram on unknown.   Review of Systems:  Pertinent items noted in HPI and remainder of comprehensive ROS otherwise negative.  Physical Exam:   General:  Alert, oriented and cooperative. Patient appears to be in no acute distress.  Mental Status: Normal mood and affect. Normal behavior. Normal judgment and thought content.   Respiratory: Normal respiratory effort, no problems with respiration noted  Rest of physical exam deferred due to type of encounter  Labs and Imaging No results found for this or any previous visit (from the past 336 hour(s)).   Assessment and Plan:   1. SAB (spontaneous abortion) S/p SAB Neg HCG in hospital No need for further follow up  2. Cerebrovascular accident (CVA), unspecified mechanism ([place]) No residual effects per patient  3. Encounter for counseling regarding contraception - desires contraception - reviewed CDC guidelines, IUD is safest option for her - reviewed risks/benefits of IUD placement, copper versus Lng IUD and safety profile of each in the setting of her CVA - she will consider and make appt for IUD placement - info for each sent via mychart - reviewed no unprotected intercourse  within 2 weeks of IUD placement    I discussed the assessment and treatment plan with the patient. The patient was provided an opportunity to ask questions and all were answered. The patient agreed with the plan and demonstrated an understanding of the instructions.   The patient was  advised to call back or seek an in-person evaluation/go to the ED if the symptoms worsen or if the condition fails to improve as anticipated.  I provided 31 minutes of face-to-face time during this encounter.   Sloan Leiter, MD [facility] for [place], [place]

## 2019-07-16 ENCOUNTER — Encounter: Payer: Self-pay | Admitting: Family

## 2019-07-16 MED ORDER — CLOPIDOGREL BISULFATE 75 MG PO TABS: 75.0000 mg | ORAL_TABLET | Freq: Every day | ORAL | 5 refills | Status: DC

## 2019-07-18 NOTE — Progress Notes (Signed)
Hi, Dr. Leonie Man and Janett Billow:  This pt was admitted for stroke s/p tPA with spontaneous abortion. Her stroke work up showed high titer of cardiolipin and beta-2-glycoprotein IgG antibodies, highly concerning for antiphospholipid syndrome. She will follow up with Janett Billow 08/08/19. She likely needs repeat the test in 3 months apart to confirm the diagnosis and also may hematology referral. Thanks for consideration.   Phyllis Hopkins

## 2019-07-26 DIAGNOSIS — M25511 Pain in right shoulder: Secondary | ICD-10-CM | POA: Diagnosis not present

## 2019-08-08 ENCOUNTER — Encounter: Payer: Self-pay | Admitting: Adult Health

## 2019-08-08 ENCOUNTER — Other Ambulatory Visit: Payer: Self-pay

## 2019-08-08 ENCOUNTER — Ambulatory Visit: Payer: Federal, State, Local not specified - PPO | Admitting: Adult Health

## 2019-08-08 VITALS — BP 125/85 | HR 64 | Temp 97.2°F | Ht 62.0 in | Wt 164.4 lb

## 2019-08-08 DIAGNOSIS — I10 Essential (primary) hypertension: Secondary | ICD-10-CM | POA: Diagnosis not present

## 2019-08-08 DIAGNOSIS — Z8673 Personal history of transient ischemic attack (TIA), and cerebral infarction without residual deficits: Secondary | ICD-10-CM | POA: Diagnosis not present

## 2019-08-08 DIAGNOSIS — E785 Hyperlipidemia, unspecified: Secondary | ICD-10-CM

## 2019-08-08 DIAGNOSIS — R760 Raised antibody titer: Secondary | ICD-10-CM | POA: Diagnosis not present

## 2019-08-08 NOTE — Patient Instructions (Addendum)
Continue aspirin 81 mg daily  and lipitor  for secondary stroke prevention  Discontinue Plavix at this time and continue on aspirin alone  Continue to follow up with PCP regarding cholesterol and blood pressure management   We will repeat lab work towards the end of March or beginning of April -you will receive a reminder call when the time is closer  Continue to monitor blood pressure at home  Maintain strict control of hypertension with blood pressure goal below 130/90, diabetes with hemoglobin A1c goal below 6.5% and cholesterol with LDL cholesterol (bad cholesterol) goal below 70 mg/dL. I also advised the patient to eat a healthy diet with plenty of whole grains, cereals, fruits and vegetables, exercise regularly and maintain ideal body weight.  Followup in the future with me in 4 months or call earlier if needed       Thank you for coming to see [place] at [facility]. I hope we have been able to provide you high quality care today.  You may receive a patient satisfaction survey over the next few weeks. We would appreciate your feedback and comments so that we may continue to improve ourselves and the health of our patients.

## 2019-08-08 NOTE — Progress Notes (Signed)
[facility] [address] [place]. [place] [address] [phone]       HOSPITAL FOLLOW UP NOTE  [name] Date of Birth:  [birth year] Medical Record Number:  [id number]   Reason for Referral:  hospital stroke follow up    CHIEF COMPLAINT:  Chief Complaint  Patient presents with  . Follow-up    TxRM. Alone. States she has no questions nor concerns. Patient stipulates that she believes her stroke came from noncompliance with her medications.    HPI: Phyllis Hickey Johnsonis being seen today for in office hospital follow-up regarding left MCA stroke on 06/20/2019.  History obtained from patient and chart review. Reviewed all radiology images and labs personally.  PhyllisPhyllis Percell Miller Johnsonis a 44 y.o.femalewith history ofhypertension, heart murmur,medication non-compliance, [facility] for[facility] urgency 12/29 and found to have a spontaneous abortion. In hospital developed sudden onset difficutly with speech with confusion.  Evaluated by stroke team with stroke work-up revealing shower left MCA embolic infarcts status post TPA in young female with possible spontaneous abortion secondary to unknown source.  Hypercoagulable work-up showed high titer cardiolipin and beta-2 glycoprotein IgG antibodies highly concerning for antiphospholipid syndrome and recommended repeat level in 3 months.  TEE negative for endocarditis, PFO or SOE.  UDS positive for THC.  Recommended DAPT for 3 weeks and aspirin alone.  Hypertensive urgency upon arrival BP 201/124 noncompliant with medications prior to admission.  LDL 93 initiate atorvastatin 40 mg daily.  Possible seizure-like activity with negative EEG did not feel as though initiation of AED indicated at that time.  Advised to follow-up with OB/GYN outpatient for possible spontaneous abortion.  Sustained right shoulder anterior dislocation likely traumatic as patient was placed to the ground by her brother tried to do CPR  when she had seizure-like activity.  She was advised to continue to follow with Dr. Veverly Fells outpatient for further management.  No other stroke risk factors and no prior history of stroke.  Other active problems include hypokalemia and thrombocytosis.  Discharged home in stable condition without therapy needs.  Phyllis Hopkins is a 44 year old female who is being seen today for hospital follow-up.  She has been doing well since discharge without residual stroke deficits or new/reoccurring stroke/TIA symptoms.  Continues on DAPT despite 3-week recommendation but denies bleeding or bruising.  Continues on atorvastatin without myalgias.  Blood pressure today satisfactory at 125/85.  She does endorse compliance with all recommended medications. Hypercoagulable work-up during admission showed high titer of cardiolipin and beta-2 glycoprotein IgG antibodies highly concerning for antiphospholipid syndrome and recommended repeat lab work in 3 months to confirm diagnosis and possible hematology referral.  She had recent follow-up with [facility] for right shoulder dislocation.  Reports healing well, remains in sling and believes she will be starting therapy soon.  No further concerns at this time.    ROS:   14 system review of systems performed and negative with exception of see HPI  PMH:  Past Medical History:  Diagnosis Date  . Blood in stool    hx of  . Heart murmur   . Hypertension     PSH:  Past Surgical History:  Procedure Laterality Date  . BUBBLE STUDY  06/25/2019   Procedure: BUBBLE STUDY;  Surgeon: Donato Heinz, MD;  Location: [place];  Service: Endoscopy;;  . TEE WITHOUT CARDIOVERSION N/A 06/25/2019   Procedure: TRANSESOPHAGEAL ECHOCARDIOGRAM (TEE);  Surgeon: Donato Heinz, MD;  Location[facility] ENDOSCOPY;  Service: Endoscopy;  Laterality: N/A;  Social History:  Social History   Socioeconomic History  . Marital status: Divorced    Spouse name: Not on file  . Number  of children: Not on file  . Years of education: Not on file  . Highest education level: Not on file  Occupational History  . Occupation: respiratory therapistory therapist    Comment: [employer]  Tobacco Use  . Smoking status: Never Smoker  . Smokeless tobacco: Never Used  Substance and Sexual Activity  . Alcohol use: No  . Drug use: No  . Sexual activity: Not on file  Other Topics Concern  . Not on file  Social History Narrative   Divorced lives w/ cousin   Regular exercise; yes   Social Determinants of Health   Financial Resource Strain:   . Difficulty of Paying Living Expenses: Not on file  Food Insecurity:   . Worried About Charity fundraiser in the Last Year: Not on file  . Ran Out of Food in the Last Year: Not on file  Transportation Needs:   . Lack of Transportation (Medical): Not on file  . Lack of Transportation (Non-Medical): Not on file  Physical Activity:   . Days of Exercise per Week: Not on file  . Minutes of Exercise per Session: Not on file  Stress:   . Feeling of Stress : Not on file  Social Connections:   . Frequency of Communication with Friends and Family: Not on file  . Frequency of Social Gatherings with Friends and Family: Not on file  . Attends Religious Services: Not on file  . Active Member of Clubs or Organizations: Not on file  . Attends Archivist Meetings: Not on file  . Marital Status: Not on file  Intimate Partner Violence:   . Fear of Current or Ex-Partner: Not on file  . Emotionally Abused: Not on file  . Physically Abused: Not on file  . Sexually Abused: Not on file    Family History:  Family History  Problem Relation Age of Onset  . Hypertension Mother   . Hypertension Father   . Diabetes Father        diet controlled    Medications:   Current Outpatient Medications on File Prior to Visit  Medication Sig Dispense Refill  . amLODipine (NORVASC) 5 MG tablet Take 1 tablet (5 mg total) by mouth daily. 30 tablet 2    . aspirin EC 81 MG EC tablet Take 1 tablet (81 mg total) by mouth daily.    Marland Kitchen atorvastatin (LIPITOR) 40 MG tablet Take 1 tablet (40 mg total) by mouth daily at 6 PM. 30 tablet 2  . labetalol (NORMODYNE) 200 MG tablet Take 1 tablet (200 mg total) by mouth 2 (two) times daily. 60 tablet 2   No current facility-administered medications on file prior to visit.    Allergies:  No Known Allergies   Physical Exam  Vitals:   08/08/19 1415  BP: 125/85  Pulse: 64  Temp: (!) 97.2 F (36.2 C)  Weight: 164 lb 6.4 oz (74.6 kg)  Height: 5\' 2"  (1.575 m)   Body mass index is 30.07 kg/m. No exam data present  General: well developed, well nourished,  pleasant middle-aged African-American female, seated, in no evident distress Head: head normocephalic and atraumatic.   Neck: supple with no carotid or supraclavicular bruits Cardiovascular: regular rate and rhythm, no murmurs Musculoskeletal: no deformity; right arm sling d/t recent right shoulder dislocation Skin:  no rash/petichiae Vascular:  Normal  pulses all extremities   Neurologic Exam Mental Status: Awake and fully alert.   Normal speech and language.  Oriented to place and time. Recent and remote memory intact. Attention span, concentration and fund of knowledge appropriate. Mood and affect appropriate.  Cranial Nerves: Fundoscopic exam reveals sharp disc margins. Pupils equal, briskly reactive to light. Extraocular movements full without nystagmus. Visual fields full to confrontation. Hearing intact. Facial sensation intact. Face, tongue, palate moves normally and symmetrically.  Motor: Normal bulk and tone. Normal strength in all tested extremity muscles. Sensory.: intact to touch , pinprick , position and vibratory sensation.  Coordination: Rapid alternating movements normal in all extremities. Finger-to-nose and heel-to-shin performed accurately bilaterally. Gait and Station: Arises from chair without difficulty. Stance is normal. Gait  demonstrates normal stride length and balance Reflexes: 1+ and symmetric. Toes downgoing.     NIHSS  0 Modified Rankin  0    Diagnostic Data (Labs, Imaging, Testing)   Code Stroke CT head No acute abnormality. ASPECTS 10.   CTA head &neck Unremarkable   CT perfusion negative  MRI small shower embolic infarct L parietal and parietal occipital L MCA infarcts.   2D EchoEF 60-65%. No source of embolus  LE venous doppler no DVT  TEEneg forLibman-Sacks endocarditis as manifestation of phospholipid syndrome, no PFO or other SOE  Hypercoagulable labs  Beta-2 glycoprotein 65(h), CL Ab pending    LDL93  HgbA1c5.5  UDS positive for THC    ASSESSMENT: Phyllis Hopkins is a 44 y.o. year old female presented initially on 06/19/2019 with hypertensive urgency and found to have spontaneous abortion with sudden onset of speech difficulty and confusion during admission on 06/20/2019.  Stroke work-up revealed shower left MCA embolic infarcts s/p TPA secondary to unknown source.  High suspicion for antiphospholipid syndrome with high titer of cardiolipin and beta-2 glycoprotein IgG antibodies and recommended repeat labs in 3 months.  Vascular risk factors include HTN or medication noncompliance and HLD.  Recovered well from a stroke standpoint without residual deficits.    PLAN:  1. Left MCA stroke: Continue aspirin 81 mg daily  and atorvastatin for secondary stroke prevention.  Advised to discontinue Plavix at this time as prolonged DAPT not indicated.  Maintain strict control of hypertension with blood pressure goal below 130/90, diabetes with hemoglobin A1c goal below 6.5% and cholesterol with LDL cholesterol (bad cholesterol) goal below 70 mg/dL.  I also advised the patient to eat a healthy diet with plenty of whole grains, cereals, fruits and vegetables, exercise regularly with at least 30 minutes of continuous activity daily and maintain ideal body weight. 2. HTN: Advised to  continue current treatment regimen. Advised to continue to monitor at home along with continued follow-up with PCP for management 3. HLD: Advised to continue current treatment regimen along with continued follow-up with PCP for future prescribing and monitoring of lipid panel 4. ?  Antiphospholipid syndrome: We will repeat labs at the end of March/beginning of April as recommended 23-month follow-up to confirm diagnosis.  If confirmed, will refer to hematology    Follow up in 4 months or call earlier if needed   Greater than 50% of time during this 45 minute visit was spent on counseling, explanation of diagnosis of left MCA stroke, reviewing risk factor management of HTN, HLD, medication noncompliance and questionable antiphospholipid syndrome, planning of further management along with potential future management, and discussion with patient answered all questions to Slick, AGNP-BC  [facility] [phone]  [address] [place], [place] [address]  Phone [phone] Fax [phone] Note: This document was prepared with digital dictation and possible smart phrase technology. Any transcriptional errors that result from this process are unintentional.

## 2019-08-09 NOTE — Progress Notes (Signed)
I agree with the above plan 

## 2019-08-16 DIAGNOSIS — M25511 Pain in right shoulder: Secondary | ICD-10-CM | POA: Diagnosis not present

## 2019-08-23 DIAGNOSIS — M25511 Pain in right shoulder: Secondary | ICD-10-CM | POA: Diagnosis not present

## 2019-08-30 DIAGNOSIS — M25511 Pain in right shoulder: Secondary | ICD-10-CM | POA: Diagnosis not present

## 2019-09-05 DIAGNOSIS — M25511 Pain in right shoulder: Secondary | ICD-10-CM | POA: Diagnosis not present

## 2019-09-07 DIAGNOSIS — M25511 Pain in right shoulder: Secondary | ICD-10-CM | POA: Diagnosis not present

## 2019-09-10 DIAGNOSIS — M25511 Pain in right shoulder: Secondary | ICD-10-CM | POA: Diagnosis not present

## 2019-09-12 DIAGNOSIS — M25511 Pain in right shoulder: Secondary | ICD-10-CM | POA: Diagnosis not present

## 2019-09-13 DIAGNOSIS — M25511 Pain in right shoulder: Secondary | ICD-10-CM | POA: Diagnosis not present

## 2019-09-18 DIAGNOSIS — M25511 Pain in right shoulder: Secondary | ICD-10-CM | POA: Diagnosis not present

## 2019-09-20 DIAGNOSIS — M25511 Pain in right shoulder: Secondary | ICD-10-CM | POA: Diagnosis not present

## 2019-09-25 DIAGNOSIS — M25511 Pain in right shoulder: Secondary | ICD-10-CM | POA: Diagnosis not present

## 2019-09-27 ENCOUNTER — Other Ambulatory Visit: Payer: Self-pay

## 2019-09-27 DIAGNOSIS — M25511 Pain in right shoulder: Secondary | ICD-10-CM | POA: Diagnosis not present

## 2019-09-27 NOTE — Patient Outreach (Signed)
First telephone outreach attempt to obtain mRS. No answer. Left message for returned call.  Ina Homes [place] Management Assistant [phone]

## 2019-10-01 ENCOUNTER — Other Ambulatory Visit: Payer: Self-pay | Admitting: Adult Health

## 2019-10-01 NOTE — Progress Notes (Signed)
Error

## 2019-10-02 ENCOUNTER — Other Ambulatory Visit: Payer: Self-pay

## 2019-10-02 DIAGNOSIS — M25511 Pain in right shoulder: Secondary | ICD-10-CM | POA: Diagnosis not present

## 2019-10-02 NOTE — Patient Outreach (Signed)
Second telephone outreach attempt to obtain mRS. Left message for returned call with patients mother.   CMA will call back later today per mother's request.  Ina Homes [facility] Management Assistant [phone]

## 2019-10-03 ENCOUNTER — Other Ambulatory Visit: Payer: Self-pay

## 2019-10-03 NOTE — Patient Outreach (Signed)
3 outreach attempts were completed to obtain mRs. mRs could not be obtained because patient never returned my calls. mRs=7    [facility] Management Assistant [phone]

## 2019-10-04 ENCOUNTER — Other Ambulatory Visit: Payer: Self-pay

## 2019-10-04 ENCOUNTER — Other Ambulatory Visit: Payer: Self-pay | Admitting: Neurology

## 2019-10-04 ENCOUNTER — Other Ambulatory Visit (INDEPENDENT_AMBULATORY_CARE_PROVIDER_SITE_OTHER): Payer: Self-pay

## 2019-10-04 DIAGNOSIS — I10 Essential (primary) hypertension: Secondary | ICD-10-CM | POA: Diagnosis not present

## 2019-10-04 DIAGNOSIS — Z8673 Personal history of transient ischemic attack (TIA), and cerebral infarction without residual deficits: Secondary | ICD-10-CM | POA: Diagnosis not present

## 2019-10-04 DIAGNOSIS — R760 Raised antibody titer: Secondary | ICD-10-CM

## 2019-10-04 DIAGNOSIS — E785 Hyperlipidemia, unspecified: Secondary | ICD-10-CM

## 2019-10-04 DIAGNOSIS — Z0289 Encounter for other administrative examinations: Secondary | ICD-10-CM

## 2019-10-05 ENCOUNTER — Ambulatory Visit: Payer: Federal, State, Local not specified - PPO | Admitting: Family

## 2019-10-05 ENCOUNTER — Encounter: Payer: Self-pay | Admitting: Family

## 2019-10-05 VITALS — BP 170/102 | HR 65 | Temp 97.8°F | Resp 16 | Ht 62.0 in | Wt 162.0 lb

## 2019-10-05 DIAGNOSIS — S43016D Anterior dislocation of unspecified humerus, subsequent encounter: Secondary | ICD-10-CM | POA: Diagnosis not present

## 2019-10-05 DIAGNOSIS — I639 Cerebral infarction, unspecified: Secondary | ICD-10-CM

## 2019-10-05 DIAGNOSIS — I10 Essential (primary) hypertension: Secondary | ICD-10-CM

## 2019-10-05 LAB — CARDIOLIPIN ANTIBODIES, IGG, IGM, IGA
Anticardiolipin IgA: <9 APL U/mL (ref 0–11)
Anticardiolipin IgG: 79 GPL U/mL — ABNORMAL HIGH (ref 0–14)
Anticardiolipin IgM: <9 MPL U/mL (ref 0–12)

## 2019-10-05 LAB — BETA-2-GLYCOPROTEIN I ABS, IGG/M/A
Beta-2 Glyco 1 IgA: <9 GPI IgA units (ref 0–25)
Beta-2 Glyco 1 IgM: 20 GPI IgM units (ref 0–32)
Beta-2 Glyco I IgG: 88 GPI IgG units — ABNORMAL HIGH (ref 0–20)

## 2019-10-05 MED ORDER — LABETALOL HCL 200 MG PO TABS: 200.0000 mg | ORAL_TABLET | Freq: Two times a day (BID) | ORAL | 1 refills | Status: DC

## 2019-10-05 MED ORDER — AMLODIPINE BESYLATE 5 MG PO TABS: 5.0000 mg | ORAL_TABLET | Freq: Every day | ORAL | 1 refills | Status: DC

## 2019-10-05 NOTE — Progress Notes (Signed)
Subjective:    Patient ID: Phyllis Hopkins, female    DOB: 1975-10-27, 44 y.o.   MRN: [id number]  HPI  Patient is a 43 yr old female who presents today for follow up of her blood pressure.   HTN- She is currently maintained on labetalol and amlodipine.  States that she did not take her AM meds today.  BP Readings from Last 3 Encounters:  10/05/19 (!) 170/102  08/08/19 125/85  07/06/19 132/88   Right shoulder dislocation- she is doing PT twice weekly. She plans to return back to work in 2 weeks.  Started going to the gym in the end of March. She feels that she has regained 90% of her shoulder function.   Hx of CVA- maintained on aspirin 81mg  and atorvastatin for secondary stroke prevention.  Review of Systems See HPI  Past Medical History:  Diagnosis Date  . Blood in stool    hx of  . Heart murmur   . Hypertension      Social History   Socioeconomic History  . Marital status: Divorced    Spouse name: Not on file  . Number of children: Not on file  . Years of education: Not on file  . Highest education level: Not on file  Occupational History  . Occupation: respiratory therapistory therapist    Comment: [employer]  Tobacco Use  . Smoking status: Never Smoker  . Smokeless tobacco: Never Used  Substance and Sexual Activity  . Alcohol use: No  . Drug use: No  . Sexual activity: Not on file  Other Topics Concern  . Not on file  Social History Narrative   Divorced lives w/ cousin   Regular exercise; yes   Social Determinants of Health   Financial Resource Strain:   . Difficulty of Paying Living Expenses:   Food Insecurity:   . Worried About Charity fundraiser in the Last Year:   . Arboriculturist in the Last Year:   Transportation Needs:   . Film/video editor (Medical):   Marland Kitchen Lack of Transportation (Non-Medical):   Physical Activity:   . Days of Exercise per Week:   . Minutes of Exercise per Session:   Stress:   . Feeling of Stress :   Social  Connections:   . Frequency of Communication with Friends and Family:   . Frequency of Social Gatherings with Friends and Family:   . Attends Religious Services:   . Active Member of Clubs or Organizations:   . Attends Archivist Meetings:   Marland Kitchen Marital Status:   Intimate Partner Violence:   . Fear of Current or Ex-Partner:   . Emotionally Abused:   Marland Kitchen Physically Abused:   . Sexually Abused:     Past Surgical History:  Procedure Laterality Date  . BUBBLE STUDY  06/25/2019   Procedure: BUBBLE STUDY;  Surgeon: Donato Heinz, MD;  Location: [place];  Service: Endoscopy;;  . TEE WITHOUT CARDIOVERSION N/A 06/25/2019   Procedure: TRANSESOPHAGEAL ECHOCARDIOGRAM (TEE);  Surgeon: Donato Heinz, MD;  Location[facility] ENDOSCOPY;  Service: Endoscopy;  Laterality: N/A;    Family History  Problem Relation Age of Onset  . Hypertension Mother   . Hypertension Father   . Diabetes Father        diet controlled    No Known Allergies  Current Outpatient Medications on File Prior to Visit  Medication Sig Dispense Refill  . aspirin EC 81 MG EC tablet Take 1 tablet (  81 mg total) by mouth daily.    Marland Kitchen atorvastatin (LIPITOR) 40 MG tablet Take 1 tablet (40 mg total) by mouth daily at 6 PM. (Patient not taking: Reported on 10/05/2019) 30 tablet 2   No current facility-administered medications on file prior to visit.    BP (!) 170/102   Pulse 65   Temp 97.8 F (36.6 C) (Temporal)   Resp 16   Ht 5\' 2"  (1.575 m)   Wt 162 lb (73.5 kg)   SpO2 100%   BMI 29.63 kg/m       Objective:   Physical Exam Constitutional:      Appearance: She is well-developed.  Neck:     Thyroid: No thyromegaly.  Cardiovascular:     Rate and Rhythm: Normal rate and regular rhythm.     Heart sounds: Normal heart sounds. No murmur.  Pulmonary:     Effort: Pulmonary effort is normal. No respiratory distress.     Breath sounds: Normal breath sounds. No wheezing.  Musculoskeletal:      Cervical back: Neck supple.  Skin:    General: Skin is warm and dry.  Neurological:     Mental Status: She is alert and oriented to person, place, and time.  Psychiatric:        Behavior: Behavior normal.        Thought Content: Thought content normal.        Judgment: Judgment normal.           Assessment & Plan:  HTN-  bp is uncontrolled secondary to medication non-compliance. Advised pt to pick her medications this afternoon and take missed doses.  Reinforced importance of compliance with medication.  Hx of CVA- clinically stable. Continue aspirin and statin.  Right shoulder dislocation- improving with PT. Continue same.  This visit occurred during the SARS-CoV-2 public health emergency.  Safety protocols were in place, including screening questions prior to the visit, additional usage of staff PPE, and extensive cleaning of exam room while observing appropriate contact time as indicated for disinfecting solutions.

## 2019-10-05 NOTE — Patient Instructions (Signed)
Please restart your blood pressure medication.

## 2019-10-08 ENCOUNTER — Other Ambulatory Visit: Payer: Self-pay | Admitting: Adult Health

## 2019-10-08 DIAGNOSIS — M25511 Pain in right shoulder: Secondary | ICD-10-CM | POA: Diagnosis not present

## 2019-10-08 DIAGNOSIS — R760 Raised antibody titer: Secondary | ICD-10-CM

## 2019-10-08 NOTE — Progress Notes (Signed)
Referral placed to hematology for concern antiphospholipid syndrome as lab work during admission for cryptogenic stroke remains elevated.  Request further evaluation and/or work-up.

## 2019-10-09 ENCOUNTER — Telehealth: Payer: Self-pay | Admitting: *Deleted

## 2019-10-09 NOTE — Telephone Encounter (Signed)
-----   Message from Frann Rider, NP sent at 10/08/2019  7:55 AM EDT ----- Please advise patient that repeat lab work showed persistent elevated levels concerning for antiphospholipid syndrome therefore referral will be placed to hematology for further evaluation

## 2019-10-09 NOTE — Telephone Encounter (Signed)
Antiphospholipid syndrome is a autoimmune disorder where your blood is more prone to clotting which can cause strokes and miscarriages among other health conditions.  Further evaluation requested by hematology as she may have to be placed on anticoagulation for management and prevention of recurrent strokes.

## 2019-10-09 NOTE — Telephone Encounter (Signed)
LMVM for pt that calling about lab results.  I will message on mychart as well.  She is to call back if quesitions.

## 2019-10-10 NOTE — Progress Notes (Signed)
Thanks, Janett Billow for the follow up. Seems like she does have APL and she likely needs long term anticoagulation. Good catch.   Rosalin Hawking, MD PhD Stroke Neurology 10/10/2019 9:08 PM

## 2019-10-11 DIAGNOSIS — M25511 Pain in right shoulder: Secondary | ICD-10-CM | POA: Diagnosis not present

## 2019-10-19 DIAGNOSIS — M25511 Pain in right shoulder: Secondary | ICD-10-CM | POA: Diagnosis not present

## 2019-10-24 ENCOUNTER — Telehealth: Payer: Self-pay | Admitting: Adult Health

## 2019-10-24 NOTE — Telephone Encounter (Signed)
Jonni Sanger  Hello!   Ms. Amar has been called to make a hematology appointment with our office. At this time, we have not heard anything back from the pt. Her referral will be closed. If the pt still needs an appt, we ask that you have the pt call our office at [phone]. Thank you.   Respectfully,   Seth Bake   I called patient also and left her a message to call  And schedule her apt.

## 2019-10-25 ENCOUNTER — Telehealth: Payer: Self-pay | Admitting: Hematology and Oncology

## 2019-10-25 NOTE — Telephone Encounter (Signed)
Received a new hem referral from [facility] for Antiphospholipid antibody positive. Phyllis Hopkins cld returned my call and has been scheduled to see Dr. Lorenso Courier on 5/26 at 1pm. She's aware to arrive 15 minutes early.

## 2019-10-31 ENCOUNTER — Ambulatory Visit: Payer: Federal, State, Local not specified - PPO | Admitting: Family

## 2019-11-02 ENCOUNTER — Ambulatory Visit: Payer: Federal, State, Local not specified - PPO | Admitting: Family

## 2019-11-06 ENCOUNTER — Encounter: Payer: Self-pay | Admitting: Family

## 2019-11-06 ENCOUNTER — Ambulatory Visit (INDEPENDENT_AMBULATORY_CARE_PROVIDER_SITE_OTHER): Payer: Federal, State, Local not specified - PPO | Admitting: Family

## 2019-11-06 ENCOUNTER — Other Ambulatory Visit: Payer: Self-pay

## 2019-11-06 ENCOUNTER — Other Ambulatory Visit (HOSPITAL_COMMUNITY)
Admission: RE | Admit: 2019-11-06 | Discharge: 2019-11-06 | Disposition: A | Discharge status: Home / Self Care | Payer: Federal, State, Local not specified - PPO | Source: Ambulatory Visit | Attending: Family | Admitting: Family

## 2019-11-06 VITALS — BP 130/76 | HR 62 | Temp 97.9°F | Resp 16 | Ht 62.0 in | Wt 160.0 lb

## 2019-11-06 DIAGNOSIS — E7800 Pure hypercholesterolemia, unspecified: Secondary | ICD-10-CM | POA: Diagnosis not present

## 2019-11-06 DIAGNOSIS — Z0000 Encounter for general adult medical examination without abnormal findings: Secondary | ICD-10-CM

## 2019-11-06 DIAGNOSIS — Z23 Encounter for immunization: Secondary | ICD-10-CM

## 2019-11-06 DIAGNOSIS — I10 Essential (primary) hypertension: Secondary | ICD-10-CM | POA: Diagnosis not present

## 2019-11-06 DIAGNOSIS — Z01419 Encounter for gynecological examination (general) (routine) without abnormal findings: Secondary | ICD-10-CM | POA: Diagnosis not present

## 2019-11-06 NOTE — Progress Notes (Signed)
Subjective:    Patient ID: Phyllis Hopkins, female    DOB: 1976/01/11, 44 y.o.   MRN: [id number]  HPI  Patient presents today for complete physical.  Immunizations: completed the covid series, tdap up tetanus is due Diet: healthy Wt Readings from Last 3 Encounters:  11/06/19 160 lb (72.6 kg)  10/05/19 162 lb (73.5 kg)  08/08/19 164 lb 6.4 oz (74.6 kg)  Exercise: [employer] at least 3 days a week.  Pap Smear: due (2011), due  Mammogram: due Vision: 2-3 years ago Dental: due  HTN- She is maintained on amlodipine 5mg  and labetalol 200mg  bid.  BP Readings from Last 3 Encounters:  11/06/19 130/76  10/05/19 (!) 170/102  08/08/19 125/85   Checked her bp a work- 5/11 183/114, 5/10 202/106.    Review of Systems  Constitutional: Negative for unexpected weight change.  HENT: Negative for hearing loss.   Eyes: Negative for visual disturbance.  Respiratory: Negative for cough.   Cardiovascular: Negative for chest pain.  Gastrointestinal: Negative for blood in stool, constipation and diarrhea.  Genitourinary: Negative for dysuria, frequency and hematuria.  Musculoskeletal: Negative for arthralgias and myalgias.  Skin: Negative for rash.  Neurological: Negative for headaches.  Hematological: Negative for adenopathy.  Psychiatric/Behavioral:       Denies depression/anxiety   Past Medical History:  Diagnosis Date  . Blood in stool    hx of  . Heart murmur   . Hypertension      Social History   Socioeconomic History  . Marital status: Divorced    Spouse name: Not on file  . Number of children: Not on file  . Years of education: Not on file  . Highest education level: Not on file  Occupational History  . Occupation: respiratory therapistory therapist    Comment: [employer]  Tobacco Use  . Smoking status: Never Smoker  . Smokeless tobacco: Never Used  Substance and Sexual Activity  . Alcohol use: No  . Drug use: No  . Sexual activity: Not on file  Other  Topics Concern  . Not on file  Social History Narrative   Divorced lives w/ cousin   Regular exercise; yes   Social Determinants of Health   Financial Resource Strain:   . Difficulty of Paying Living Expenses:   Food Insecurity:   . Worried About Charity fundraiser in the Last Year:   . Arboriculturist in the Last Year:   Transportation Needs:   . Film/video editor (Medical):   Marland Kitchen Lack of Transportation (Non-Medical):   Physical Activity:   . Days of Exercise per Week:   . Minutes of Exercise per Session:   Stress:   . Feeling of Stress :   Social Connections:   . Frequency of Communication with Friends and Family:   . Frequency of Social Gatherings with Friends and Family:   . Attends Religious Services:   . Active Member of Clubs or Organizations:   . Attends Archivist Meetings:   Marland Kitchen Marital Status:   Intimate Partner Violence:   . Fear of Current or Ex-Partner:   . Emotionally Abused:   Marland Kitchen Physically Abused:   . Sexually Abused:     Past Surgical History:  Procedure Laterality Date  . BUBBLE STUDY  06/25/2019   Procedure: BUBBLE STUDY;  Surgeon: Donato Heinz, MD;  Location: [facility];  Service: Endoscopy;;  . TEE WITHOUT CARDIOVERSION N/A 06/25/2019   Procedure: TRANSESOPHAGEAL ECHOCARDIOGRAM (TEE);  Surgeon:  Donato Heinz, MD;  Location: [facility] ENDOSCOPY;  Service: Endoscopy;  Laterality: N/A;    Family History  Problem Relation Age of Onset  . Hypertension Mother   . Hypertension Father   . Diabetes Father        diet controlled    No Known Allergies  Current Outpatient Medications on File Prior to Visit  Medication Sig Dispense Refill  . amLODipine (NORVASC) 5 MG tablet Take 1 tablet (5 mg total) by mouth daily. 90 tablet 1  . aspirin EC 81 MG EC tablet Take 1 tablet (81 mg total) by mouth daily.    Marland Kitchen atorvastatin (LIPITOR) 40 MG tablet Take 1 tablet (40 mg total) by mouth daily at 6 PM. 30 tablet 2  . labetalol (NORMODYNE)  200 MG tablet Take 1 tablet (200 mg total) by mouth 2 (two) times daily. 180 tablet 1   No current facility-administered medications on file prior to visit.    BP 130/76 (BP Location: Left Arm, Patient Position: Sitting, Cuff Size: Small)   Pulse 62   Temp 97.9 F (36.6 C) (Temporal)   Resp 16   Ht 5\' 2"  (1.575 m)   Wt 160 lb (72.6 kg)   SpO2 100%   BMI 29.26 kg/m       Objective:   Physical Exam  Physical Exam  Constitutional: She is oriented to person, place, and time. She appears well-developed and well-nourished. No distress.  HENT:  Head: Normocephalic and atraumatic.  Right Ear: Tympanic membrane and ear canal normal.  Left Ear: Tympanic membrane and ear canal normal.  Mouth/Throat: Not examined- pt wearing mask Eyes: Pupils are equal, round, and reactive to light. No scleral icterus.  Neck: Normal range of motion. No thyromegaly present.  Cardiovascular: Normal rate and regular rhythm.   No murmur heard. Pulmonary/Chest: Effort normal and breath sounds normal. No respiratory distress. He has no wheezes. She has no rales. She exhibits no tenderness.  Abdominal: Soft. Bowel sounds are normal. She exhibits no distension and no mass. There is no tenderness. There is no rebound and no guarding.  Musculoskeletal: She exhibits no edema.  Lymphadenopathy:    She has no cervical adenopathy.  Neurological: She is alert and oriented to person, place, and time. She has normal patellar reflexes. She exhibits normal muscle tone. Coordination normal.  Skin: Skin is warm and dry.  Psychiatric: She has a normal mood and affect. Her behavior is normal. Judgment and thought content normal.  Breasts: Examined lying Right: Without masses, retractions, discharge or axillary adenopathy.  Left: Without masses, retractions, discharge or axillary adenopathy.  Inguinal/mons: Normal without inguinal adenopathy  External genitalia: Normal  BUS/Urethra/Skene's glands: Normal  Bladder: Normal    Vagina: Normal  Cervix: Normal  Uterus: normal in size, shape and contour. Midline and mobile  Adnexa/parametria:  Rt: Without masses or tenderness.  Lt: Without masses or tenderness.  Anus and perineum: Normal            Assessment & Plan:  Preventative care- pap today. Refer for mammo. Td booster today.  Obtain routine lab work.    HTN- repeat bp is improved. Continue current meds. Reinforced importance of med compliance.  This visit occurred during the SARS-CoV-2 public health emergency.  Safety protocols were in place, including screening questions prior to the visit, additional usage of staff PPE, and extensive cleaning of exam room while observing appropriate contact time as indicated for disinfecting solutions.         Assessment &  Plan:

## 2019-11-06 NOTE — Patient Instructions (Addendum)
Please schedule a routine dental and vision exams. Please continue to work on healthy diet exercise and weight loss. Schedule mammogram on the first floor.  Complete lab work prior to leaving.

## 2019-11-07 LAB — BASIC METABOLIC PANEL
BUN: 9 mg/dL (ref 6–23)
CO2: 26 mEq/L (ref 19–32)
Calcium: 9.7 mg/dL (ref 8.4–10.5)
Chloride: 106 mEq/L (ref 96–112)
Creatinine, Ser: 0.86 mg/dL (ref 0.40–1.20)
GFR: 71.1 mL/min (ref >60.00)
Glucose, Bld: 88 mg/dL (ref 70–99)
Potassium: 4.2 mEq/L (ref 3.5–5.1)
Sodium: 138 mEq/L (ref 135–145)

## 2019-11-07 LAB — HEPATIC FUNCTION PANEL
ALT: 10 U/L (ref 0–35)
AST: 13 U/L (ref 0–37)
Albumin: 4 g/dL (ref 3.5–5.2)
Alkaline Phosphatase: 56 U/L (ref 39–117)
Bilirubin, Direct: 0.1 mg/dL (ref 0.0–0.3)
Total Bilirubin: 0.5 mg/dL (ref 0.2–1.2)
Total Protein: 6.7 g/dL (ref 6.0–8.3)

## 2019-11-07 LAB — CBC WITH DIFFERENTIAL/PLATELET
Basophils Absolute: 0 10*3/uL (ref 0.0–0.1)
Basophils Relative: 0.4 % (ref 0.0–3.0)
Eosinophils Absolute: 0.1 10*3/uL (ref 0.0–0.7)
Eosinophils Relative: 0.9 % (ref 0.0–5.0)
HCT: 32.8 % — ABNORMAL LOW (ref 36.0–46.0)
Hemoglobin: 11.2 g/dL — ABNORMAL LOW (ref 12.0–15.0)
Lymphocytes Relative: 30.1 % (ref 12.0–46.0)
Lymphs Abs: 1.7 10*3/uL (ref 0.7–4.0)
MCHC: 34.1 g/dL (ref 30.0–36.0)
MCV: 88.8 fl (ref 78.0–100.0)
Monocytes Absolute: 0.5 10*3/uL (ref 0.1–1.0)
Monocytes Relative: 9.2 % (ref 3.0–12.0)
Neutro Abs: 3.3 10*3/uL (ref 1.4–7.7)
Neutrophils Relative %: 59.4 % (ref 43.0–77.0)
Platelets: 532 10*3/uL — ABNORMAL HIGH (ref 150.0–400.0)
RBC: 3.7 Mil/uL — ABNORMAL LOW (ref 3.87–5.11)
RDW: 15.7 % — ABNORMAL HIGH (ref 11.5–15.5)
WBC: 5.5 10*3/uL (ref 4.0–10.5)

## 2019-11-07 LAB — LIPID PANEL
Cholesterol: 110 mg/dL (ref 0–200)
HDL: 38.1 mg/dL — ABNORMAL LOW (ref >39.00)
LDL Cholesterol: 41 mg/dL (ref 0–99)
NonHDL: 72.33
Total CHOL/HDL Ratio: 3
Triglycerides: 157 mg/dL — ABNORMAL HIGH (ref 0.0–149.0)
VLDL: 31.4 mg/dL (ref 0.0–40.0)

## 2019-11-07 LAB — TSH: TSH: 0.81 u[IU]/mL (ref 0.35–4.50)

## 2019-11-09 LAB — CYTOLOGY - PAP
Comment: NEGATIVE
Diagnosis: NEGATIVE
Diagnosis: REACTIVE
High risk HPV: NEGATIVE

## 2019-11-13 NOTE — Progress Notes (Signed)
[facility] [phone]   [phone]  INITIAL CONSULT NOTE  Patient Care Team: Debbrah Alar, NP as PCP - General (Internal Medicine)  Hematological/Oncological History # Positive APS Antibodies in Setting of CVA 1) 06/20/2019: Left MCA stroke. Started on aspirin therapy 64m daily.   2) 06/21/2019: Beta-2-glycoprotein IgG 65, Anticardiolipin IgG 66 3) 10/04/2019: as part of neurology workup, patient underwent APS testing. Found to have Anticardiolipin IgG elevated at 79, Beta 2 Glycoprotein IgG elevated at 88.  4) 11/14/2019: establish care with Dr. DLorenso Courier  CHIEF COMPLAINTS/PURPOSE OF CONSULTATION:  "Positive APS Antibodies in Setting of CVA "  HISTORY OF PRESENTING ILLNESS:  Phyllis Kathan44y.o. female with medical history significant for a left MCA stroke in Dec 2020 and HTN who presents for evaluation of Positive APS Antibodies in Setting of CVA.   On review of the previous records this [name] a stroke on 06/20/2019 in the left MCA.  She was treated by neurology and received therapy with aspirin 81 mg which she continues to this day.  The following day on 06/21/2019 the patient had antiphospholipid antibody testing which showed a beta-2 glycoprotein IgG of 65 and anticardiolipin of 66.  On 10/04/2019 the patient was undergoing further work-up at which time antiphospholipid antibodies were repeated.  The patient had an anticardiolipin IgG elevated at 79 and Beta 2 Glycoprotein IgG elevated at 88. .  Due to concern for this finding the patient was referred to hematology for further evaluation and management.  On exam today [name] that she has been doing quite well.  Ports that she has always had issues with her hypertension and that that was originally thought to be the cause of her stroke.  She notes that she was initially diagnosed with her stroke stroke after she had a seizure and dislocated her shoulder.  Unfortunately at  the time of that stroke diagnosis she also found that she had had a miscarriage.  She reports that she has no other histories of pregnancies or miscarriages in her past.  She was noted to have blood in her stool at that time and underwent GI evaluation.  On further discussion she has no family history remarkable for clotting disorders or rheumatological issues.  She notes that her maternal grandmother did have a stroke at the age of 46 but that her mother and sister are quite healthy.  She is a non-smoker and does not drink any alcohol.  Otherwise she had no additional questions or concerns.  A full 10 point ROS is listed below.  MEDICAL HISTORY:  Past Medical History:  Diagnosis Date  . Blood in stool    hx of  . Heart murmur   . Hypertension     SURGICAL HISTORY: Past Surgical History:  Procedure Laterality Date  . BUBBLE STUDY  06/25/2019   Procedure: BUBBLE STUDY;  Surgeon: SDonato Heinz MD;  [facility] [facility]  Service: Endoscopy;;  . TEE WITHOUT CARDIOVERSION N/A 06/25/2019   Procedure: TRANSESOPHAGEAL ECHOCARDIOGRAM (TEE);  Surgeon: SDonato Heinz MD;  [facility];  Service: Endoscopy;  Laterality: N/A;    SOCIAL HISTORY: Social History   Socioeconomic History  . Marital status: Divorced    Spouse name: Not on file  . Number of children: Not on file  . Years of education: Not on file  . Highest education level: Not on file  Occupational History  . Occupation: respiratory therapistory therapist    Comment: [employer]  Tobacco Use  . Smoking status: Never Smoker  . Smokeless tobacco: Never Used  Substance and Sexual Activity  . Alcohol use: No  . Drug use: No  . Sexual activity: Not on file  Other Topics Concern  . Not on file  Social History Narrative   Divorced lives w/ cousin   Regular exercise; yes   Social Determinants of Health   Financial Resource Strain:   . Difficulty of Paying Living Expenses:   Food Insecurity:   .  Worried About Charity fundraiser in the Last Year:   . Arboriculturist in the Last Year:   Transportation Needs:   . Film/video editor (Medical):   Marland Kitchen Lack of Transportation (Non-Medical):   Physical Activity:   . Days of Exercise per Week:   . Minutes of Exercise per Session:   Stress:   . Feeling of Stress :   Social Connections:   . Frequency of Communication with Friends and Family:   . Frequency of Social Gatherings with Friends and Family:   . Attends Religious Services:   . Active Member of Clubs or Organizations:   . Attends Archivist Meetings:   Marland Kitchen Marital Status:   Intimate Partner Violence:   . Fear of Current or Ex-Partner:   . Emotionally Abused:   Marland Kitchen Physically Abused:   . Sexually Abused:     FAMILY HISTORY: Family History  Problem Relation Age of Onset  . Hypertension Mother   . Hypertension Father   . Diabetes Father        diet controlled    ALLERGIES:  has No Known Allergies.  MEDICATIONS:  Current Outpatient Medications  Medication Sig Dispense Refill  . amLODipine (NORVASC) 5 MG tablet Take 1 tablet (5 mg total) by mouth daily. 90 tablet 1  . aspirin EC 81 MG EC tablet Take 1 tablet (81 mg total) by mouth daily.    Marland Kitchen atorvastatin (LIPITOR) 40 MG tablet Take 1 tablet (40 mg total) by mouth daily at 6 PM. 30 tablet 2  . labetalol (NORMODYNE) 200 MG tablet Take 1 tablet (200 mg total) by mouth 2 (two) times daily. 180 tablet 1   No current facility-administered medications for this visit.    REVIEW OF SYSTEMS:   Constitutional: ( - ) fevers, ( - )  chills , ( - ) night sweats Eyes: ( - ) blurriness of vision, ( - ) double vision, ( - ) watery eyes Ears, nose, mouth, throat, and face: ( - ) mucositis, ( - ) sore throat Respiratory: ( - ) cough, ( - ) dyspnea, ( - ) wheezes Cardiovascular: ( - ) palpitation, ( - ) chest discomfort, ( - ) lower extremity swelling Gastrointestinal:  ( - ) nausea, ( - ) heartburn, ( - ) change in bowel  habits Skin: ( - ) abnormal skin rashes Lymphatics: ( - ) new lymphadenopathy, ( - ) easy bruising Neurological: ( - ) numbness, ( - ) tingling, ( - ) new weaknesses Behavioral/Psych: ( - ) mood change, ( - ) new changes  All other systems were reviewed with the patient and are negative.  PHYSICAL EXAMINATION: ECOG PERFORMANCE STATUS: 0 - Asymptomatic  Vitals:   11/14/19 1329  Pulse: 60  Resp: 20  Temp: 97.7 F (36.5 C)  SpO2: 100%   Filed Weights   11/14/19 1329  Weight: 161 lb 14.4 oz (73.4 kg)    GENERAL: well appearing middle aged [place] American female  in NAD  SKIN: skin color, texture, turgor are normal, no rashes or significant lesions EYES: conjunctiva are pink and non-injected, sclera clear LUNGS: clear to auscultation and percussion with normal breathing effort HEART: regular rate & rhythm and no murmurs and no lower extremity edema Musculoskeletal: no cyanosis of digits and no clubbing  PSYCH: alert & oriented x 3, fluent speech NEURO: no focal motor/sensory deficits. No clear residual deficits from stroke.   LABORATORY DATA:  I have reviewed the data as listed CBC Latest Ref Rng & Units 11/14/2019 11/06/2019 06/25/2019  WBC 4.0 - 10.5 K/uL 4.9 5.5 5.3  Hemoglobin 12.0 - 15.0 g/dL 11.9(L) 11.2(L) 10.3(L)  Hematocrit 36.0 - 46.0 % 36.4 32.8(L) 31.6(L)  Platelets 150 - 400 K/uL 508(H) 532.0(H) 592(H)    CMP Latest Ref Rng & Units 11/14/2019 11/06/2019 06/25/2019  Glucose 70 - 99 mg/dL 81 88 101(H)  BUN 6 - 20 mg/dL _0 Creatinine 0.44 - 1.00 mg/dL 0.83 0.86 0.88  Sodium 135 - 145 mmol/L 140 138 140  Potassium 3.5 - 5.1 mmol/L 4.0 4.2 3.8  Chloride 98 - 111 mmol/L 108 106 107  CO2 22 - 32 mmol/L _1 Calcium 8.9 - 10.3 mg/dL 9.6 9.7 8.7(L)  Total Protein 6.5 - 8.1 g/dL 7.7 6.7 -  Total Bilirubin 0.3 - 1.2 mg/dL 0.6 0.5 -  Alkaline Phos 38 - 126 U/L 56 56 -  AST 15 - 41 U/L 16 13 -  ALT 0 - 44 U/L 12 10 -    RADIOGRAPHIC STUDIES: MM 3D SCREEN BREAST  BILATERAL  Result Date: 11/16/2019 CLINICAL DATA:  Screening. EXAM: DIGITAL SCREENING BILATERAL MAMMOGRAM WITH TOMO AND CAD COMPARISON:  None. ACR Breast Density Category c: The breast tissue is heterogeneously dense, which may obscure small masses. FINDINGS: In the right breast an apparent enlarged lymph node requires further evaluation. In the left breast an asymmetry on the left MLO view requires further evaluation. Images were processed with CAD. IMPRESSION: Further evaluation is suggested for possible enlarged right axillary lymph node. Further evaluation is suggested for possible asymmetry in the left breast. RECOMMENDATION: Diagnostic mammogram and possibly ultrasound of both breasts. (Code:[id number]) The patient will be contacted regarding the findings, and additional imaging will be scheduled. BI-RADS CATEGORY  0: Incomplete. Need additional imaging evaluation and/or prior mammograms for comparison. Electronically Signed   By: Dorise Bullion III M.D   On: 11/16/2019 13:12    ASSESSMENT & PLAN Kately Graffam 44 y.o. female with medical history significant for a left MCA stroke in Dec 2020 and HTN who presents for evaluation of Positive APS Antibodies in Setting of CVA.  After review the labs, review of the records, discussion with the patient her findings are most concerning for antiphospholipid antibody causing a stroke.  At the moment the patient does meet full criteria for the diagnosis of antiphospholipid antibody syndrome given that she has had repeat testing of these antibodies 12 weeks out and meets the clinic criteria.    The treatment of strokes and antiphospholipid antibody syndrome is not particularly well studied, however there is data suggesting that patients placed on aspirin and Coumadin therapy have superior outcomes to those on aspirin alone.  At this time I will make recommendations to the primary care provider to have the patient tied in with the Coumadin clinic in order to  have that therapy started.  I expressed to the patient that Coumadin would provide an extra layer protection and that there  is some data supporting this finding.  We will plan to have the patient return in approximately 2 months time to assure that she has started Coumadin therapy and is tolerating it well.  # Positive APS Antibodies in Setting of CVA #Confirmed Antiphospholipid Antibody Syndrome --patient was found to have markedly elevated titers for Anticardiolipin and Anti-beta 2 glycoprotein, separated by 12 weeks.  --Patient currently meets clinical criteria and the diagnosis was confirmed with repeat testing of antibodies in 12 weeks time. --after CVA with APS it is recommended that patients be on therapy with coumadin. Other DOACs are inferior to coumadin in the setting of APS --will reach out to PCP to have patient connected with coumadin clinic through her primary care. Unfortunately we do no offer coumadin clinic services at the [facility]. --f/u in July 2021 to assure the patient is on coumadin and tolerating therapy well.   #Iron Deficiency Anemia 2/2 to Blood Loss --likely occurred in the setting of reported GI bleed --will order iron panel for the patient today --continue to monitor  Orders Placed This Encounter  Procedures  . CBC with Differential (Cancer Center Only)    Standing Status:   Future    Number of Occurrences:   1    Standing Expiration Date:   11/13/2020  . CMP ([facility] only)    Standing Status:   Future    Number of Occurrences:   1    Standing Expiration Date:   11/13/2020  . Protime-INR    Standing Status:   Future    Number of Occurrences:   1    Standing Expiration Date:   11/13/2020  . Iron and TIBC    Standing Status:   Future    Number of Occurrences:   1    Standing Expiration Date:   11/13/2020  . Ferritin    Standing Status:   Future    Number of Occurrences:   1    Standing Expiration Date:   11/13/2020  . Lupus  anticoagulant panel*    Standing Status:   Future    Number of Occurrences:   1    Standing Expiration Date:   11/13/2020    All questions were answered. The patient knows to call the clinic with any problems, questions or concerns.  A total of more than 60 minutes were spent on this encounter and over half of that time was spent on counseling and coordination of care as outlined above.   Ledell Peoples, MD Department of Hematology/Oncology [place] at [facility] Phone: [phone] Pager: [phone] Email: [email or web address]  11/18/2019 11:45 AM  Literature Support:  Farley Ly Comparison between single antiplatelet therapy and combination of antiplatelet and anticoagulation therapy for secondary prevention in ischemic stroke patients with antiphospholipid syndrome. Int J Med Sci. 2009 Dec 5;7(1):15-8.   --The cumulative incidence of stroke in patients with single antiplatelet treatment was statistically significantly higher than that in patients receiving the combination of antiplatelet and anticoagulation therapy (log-rank test, p-value=0.026). The incidence of hemorrhagic complications was similar in the two groups. Our results indicate that combination therapy may be more effective in APS-related ischemic stroke.  Conni Slipper MD, Ileana Roup, Branch DW, Brey RL, [place], Derksen RH, DE [place], [place], Meroni PL, Reber G, Shoenfeld Y, Tincani A, Vlachoyiannopoulos PG, Krilis Waterford. International consensus statement on an update of the classification criteria for definite antiphospholipid syndrome (  APS). J Thromb Haemost. 2006 Feb;4(2):295-306.   --Antiphospholipid antibody syndrome (APS) is present if at least one of the clinical criteria and one of the laboratory criteria that follow are met* Clinical criteria 1. Vascular thrombosis One or more clinical episodes of arterial, venous, or small vessel  thrombosis, in any tissue or organ. Thrombosis must be confirmed by objective validated criteria (i.e. unequivocal findings of appropriate imaging studies or histopathology). For histopathologic confirmation, thrombosis should be present without significant evidence of inflammation in the vessel wall. 2. Pregnancy morbidity (a) One or more unexplained deaths of a morphologically normal fetus at or beyond the 10th week of gestation, with normal fetal morphology documented by ultrasound or by direct examination of the fetus, or (b) One or more premature births of a morphologically normal neonate before the 34th week of gestation because of: (i) eclampsia or severe pre?eclampsia defined according to standard definitions, or (ii) recognized features of placental insufficiency or (c) Three or more unexplained consecutive spontaneous abortions before the 10th week of gestation, with maternal anatomic or hormonal abnormalities and paternal and maternal chromosomal causes excluded. In studies of populations of patients who have more than one type of pregnancy morbidity, investigators are strongly encouraged to stratify groups of subjects according to a, b, or c above.    Laboratory criteria 1. Lupus anticoagulant (LA) present in plasma, on two or more occasions at least 12weeks apart, detected according to the guidelines of the International Society on Thrombosis and Haemostasis (Scientific Subcommittee on LAs/phospholipid?dependent antibodies) . 2. Anticardiolipin (aCL) antibody of IgG and/or IgM isotype in serum or plasma, present in medium or high titer (i.e. >40 GPL or MPL, or >the 99th percentile), on two or more occasions, at least 12weeks apart, measured by a standardized ELISA . 3. Anti??2 glycoprotein?I antibody of IgG and/or IgM isotype in serum or plasma (in titer >the 99th percentile), present on two or more occasions, at least 12weeks apart, measured by a standardized ELISA, according to recommended  procedures.

## 2019-11-14 ENCOUNTER — Other Ambulatory Visit: Payer: Self-pay

## 2019-11-14 ENCOUNTER — Inpatient Hospital Stay: Payer: Federal, State, Local not specified - PPO

## 2019-11-14 ENCOUNTER — Inpatient Hospital Stay
Payer: Federal, State, Local not specified - PPO | Attending: Hematology and Oncology | Admitting: Hematology and Oncology

## 2019-11-14 VITALS — HR 60 | Temp 97.7°F | Resp 20 | Ht 62.0 in | Wt 161.9 lb

## 2019-11-14 DIAGNOSIS — D6861 Antiphospholipid syndrome: Secondary | ICD-10-CM | POA: Diagnosis not present

## 2019-11-14 DIAGNOSIS — I10 Essential (primary) hypertension: Secondary | ICD-10-CM | POA: Diagnosis not present

## 2019-11-14 DIAGNOSIS — Z79899 Other long term (current) drug therapy: Secondary | ICD-10-CM | POA: Diagnosis not present

## 2019-11-14 DIAGNOSIS — D500 Iron deficiency anemia secondary to blood loss (chronic): Secondary | ICD-10-CM

## 2019-11-14 DIAGNOSIS — R760 Raised antibody titer: Secondary | ICD-10-CM

## 2019-11-14 DIAGNOSIS — Z7901 Long term (current) use of anticoagulants: Secondary | ICD-10-CM | POA: Insufficient documentation

## 2019-11-14 DIAGNOSIS — Z8673 Personal history of transient ischemic attack (TIA), and cerebral infarction without residual deficits: Secondary | ICD-10-CM | POA: Diagnosis not present

## 2019-11-14 LAB — CBC WITH DIFFERENTIAL (CANCER CENTER ONLY)
Abs Immature Granulocytes: 0.01 10*3/uL (ref 0.00–0.07)
Basophils Absolute: 0 10*3/uL (ref 0.0–0.1)
Basophils Relative: 0 %
Eosinophils Absolute: 0.1 10*3/uL (ref 0.0–0.5)
Eosinophils Relative: 2 %
HCT: 36.4 % (ref 36.0–46.0)
Hemoglobin: 11.9 g/dL — ABNORMAL LOW (ref 12.0–15.0)
Immature Granulocytes: 0 %
Lymphocytes Relative: 42 %
Lymphs Abs: 2 10*3/uL (ref 0.7–4.0)
MCH: 29.5 pg (ref 26.0–34.0)
MCHC: 32.7 g/dL (ref 30.0–36.0)
MCV: 90.1 fL (ref 80.0–100.0)
Monocytes Absolute: 0.5 10*3/uL (ref 0.1–1.0)
Monocytes Relative: 10 %
Neutro Abs: 2.3 10*3/uL (ref 1.7–7.7)
Neutrophils Relative %: 46 %
Platelet Count: 508 10*3/uL — ABNORMAL HIGH (ref 150–400)
RBC: 4.04 MIL/uL (ref 3.87–5.11)
RDW: 15 % (ref 11.5–15.5)
WBC Count: 4.9 10*3/uL (ref 4.0–10.5)
nRBC: 0 % (ref 0.0–0.2)

## 2019-11-14 LAB — CMP (CANCER CENTER ONLY)
ALT: 12 U/L (ref 0–44)
AST: 16 U/L (ref 15–41)
Albumin: 3.9 g/dL (ref 3.5–5.0)
Alkaline Phosphatase: 56 U/L (ref 38–126)
Anion gap: 9 (ref 5–15)
BUN: 11 mg/dL (ref 6–20)
CO2: 23 mmol/L (ref 22–32)
Calcium: 9.6 mg/dL (ref 8.9–10.3)
Chloride: 108 mmol/L (ref 98–111)
Creatinine: 0.83 mg/dL (ref 0.44–1.00)
GFR, Est AFR Am: >60 mL/min (ref >60)
GFR, Estimated: >60 mL/min (ref >60)
Glucose, Bld: 81 mg/dL (ref 70–99)
Potassium: 4 mmol/L (ref 3.5–5.1)
Sodium: 140 mmol/L (ref 135–145)
Total Bilirubin: 0.6 mg/dL (ref 0.3–1.2)
Total Protein: 7.7 g/dL (ref 6.5–8.1)

## 2019-11-14 LAB — PROTIME-INR
INR: 0.9 (ref 0.8–1.2)
Prothrombin Time: 11.8 seconds (ref 11.4–15.2)

## 2019-11-15 ENCOUNTER — Telehealth: Payer: Self-pay | Admitting: Family

## 2019-11-15 ENCOUNTER — Encounter: Payer: Self-pay | Admitting: Family

## 2019-11-15 ENCOUNTER — Ambulatory Visit (HOSPITAL_BASED_OUTPATIENT_CLINIC_OR_DEPARTMENT_OTHER)
Admission: RE | Admit: 2019-11-15 | Discharge: 2019-11-15 | Disposition: A | Discharge status: Home / Self Care | Payer: Federal, State, Local not specified - PPO | Source: Ambulatory Visit | Attending: Family | Admitting: Family

## 2019-11-15 DIAGNOSIS — R928 Other abnormal and inconclusive findings on diagnostic imaging of breast: Secondary | ICD-10-CM | POA: Insufficient documentation

## 2019-11-15 DIAGNOSIS — Z1231 Encounter for screening mammogram for malignant neoplasm of breast: Secondary | ICD-10-CM | POA: Diagnosis not present

## 2019-11-15 DIAGNOSIS — Z0000 Encounter for general adult medical examination without abnormal findings: Secondary | ICD-10-CM

## 2019-11-15 LAB — IRON AND TIBC
Iron: 64 ug/dL (ref 41–142)
Saturation Ratios: 18 % — ABNORMAL LOW (ref 21–57)
TIBC: 356 ug/dL (ref 236–444)
UIBC: 292 ug/dL (ref 120–384)

## 2019-11-15 LAB — LUPUS ANTICOAGULANT PANEL
DRVVT: 35 s (ref 0.0–47.0)
PTT Lupus Anticoagulant: 43.2 s (ref 0.0–51.9)

## 2019-11-15 LAB — FERRITIN: Ferritin: 6 ng/mL — ABNORMAL LOW (ref 11–307)

## 2019-11-15 NOTE — Telephone Encounter (Signed)
Patient came into office regarding blood thinner medicine , that was mention by her Hematology doctor . Please advise .

## 2019-11-16 ENCOUNTER — Telehealth: Payer: Self-pay | Admitting: *Deleted

## 2019-11-16 ENCOUNTER — Telehealth: Payer: Self-pay | Admitting: Hematology and Oncology

## 2019-11-16 ENCOUNTER — Telehealth: Payer: Self-pay | Admitting: Family

## 2019-11-16 NOTE — Telephone Encounter (Signed)
Patient advised about provider's comments.

## 2019-11-16 NOTE — Telephone Encounter (Signed)
TCT patient. No answer but was able to leave vm message for patient to return this call @ [phone] at her earliest convenience

## 2019-11-16 NOTE — Telephone Encounter (Signed)
Provider spoke to patient on a separate message.

## 2019-11-16 NOTE — Telephone Encounter (Signed)
Please advise pt that radiology noted a swollen gland on her mammogram and they will be contacting her to set up additional imaging for further evaluation. Let me know if she has not heard from them in the next 1 week.

## 2019-11-16 NOTE — Telephone Encounter (Signed)
-----   Message from John T Dorsey IV, MD sent at 11/15/2019  5:01 PM EDT ----- Please let Mrs. Granier know that her labs show a mild iron deficiency anemia. We can call in Ferrous Sulfate 325mg PO daily with Vitamin C to a pharmacy of her choosing. Plan to see her back in approximately 3 months time.   Best,  Jay  ----- Message ----- From: Interface, Lab In [facility] Sent: 11/14/2019   2:55 PM EDT To: John T Dorsey IV, MD   

## 2019-11-16 NOTE — Telephone Encounter (Signed)
Scheduled per los. Called and left msg. Mailed printout  °

## 2019-11-20 ENCOUNTER — Other Ambulatory Visit: Payer: Self-pay | Admitting: Family

## 2019-11-20 DIAGNOSIS — R928 Other abnormal and inconclusive findings on diagnostic imaging of breast: Secondary | ICD-10-CM

## 2019-11-22 ENCOUNTER — Telehealth: Payer: Self-pay | Admitting: *Deleted

## 2019-11-22 NOTE — Telephone Encounter (Signed)
-----   Message from John T Dorsey IV, MD sent at 11/15/2019  5:01 PM EDT ----- Please let Phyllis Hopkins know that her labs show a mild iron deficiency anemia. We can call in Ferrous Sulfate 325mg PO daily with Vitamin C to a pharmacy of her choosing. Plan to see her back in approximately 3 months time.   Best,  Jay  ----- Message ----- From: Interface, Lab In [place] Sent: 11/14/2019   2:55 PM EDT To: John T Dorsey IV, MD   

## 2019-11-22 NOTE — Telephone Encounter (Signed)
2nd call to patient regarding lab results. No answer. Left vm message for pt to call back [phone]. About her lab results and prescription for po iron

## 2019-11-26 ENCOUNTER — Telehealth: Payer: Self-pay | Admitting: *Deleted

## 2019-11-26 MED ORDER — FERROUS SULFATE 325 (65 FE) MG PO TBEC
325.0000 mg | DELAYED_RELEASE_TABLET | Freq: Every day | ORAL | 3 refills | Status: DC
Start: 2019-11-26 — End: 2020-01-11

## 2019-11-26 NOTE — Telephone Encounter (Signed)
-----   Message from Orson Slick, MD sent at 11/15/2019  5:01 PM EDT ----- Please let Phyllis Hopkins know that her labs show a mild iron deficiency anemia. We can call in Ferrous Sulfate 325mg  PO daily with Vitamin C to a pharmacy of her choosing. Plan to see her back in approximately 3 months time.   Colan Neptune  ----- Message ----- From: Buel Ream, Lab In [place] Sent: 11/14/2019   2:55 PM EDT To: Orson Slick, MD

## 2019-11-26 NOTE — Telephone Encounter (Signed)
TCT patient regarding recent lab

## 2019-12-05 ENCOUNTER — Other Ambulatory Visit: Payer: Self-pay

## 2019-12-05 ENCOUNTER — Ambulatory Visit
Admission: RE | Admit: 2019-12-05 | Discharge: 2019-12-05 | Disposition: A | Discharge status: Home / Self Care | Payer: Federal, State, Local not specified - PPO | Source: Ambulatory Visit | Attending: Family | Admitting: Family

## 2019-12-05 ENCOUNTER — Ambulatory Visit: Payer: Federal, State, Local not specified - PPO

## 2019-12-05 DIAGNOSIS — N6489 Other specified disorders of breast: Secondary | ICD-10-CM | POA: Diagnosis not present

## 2019-12-05 DIAGNOSIS — R928 Other abnormal and inconclusive findings on diagnostic imaging of breast: Secondary | ICD-10-CM

## 2019-12-05 DIAGNOSIS — R922 Inconclusive mammogram: Secondary | ICD-10-CM | POA: Diagnosis not present

## 2019-12-06 ENCOUNTER — Ambulatory Visit: Payer: Federal, State, Local not specified - PPO | Admitting: Adult Health

## 2019-12-13 ENCOUNTER — Encounter: Payer: Self-pay | Admitting: Family

## 2019-12-13 MED ORDER — ATORVASTATIN CALCIUM 40 MG PO TABS: 40.0000 mg | ORAL_TABLET | Freq: Every day | ORAL | 2 refills | Status: DC

## 2020-01-10 ENCOUNTER — Encounter: Payer: Self-pay | Admitting: Adult Health

## 2020-01-10 ENCOUNTER — Ambulatory Visit: Payer: Federal, State, Local not specified - PPO | Admitting: Adult Health

## 2020-01-10 NOTE — Progress Notes (Deleted)
[facility] [address] [place] [address] [phone]       HOSPITAL FOLLOW UP NOTE  [name] Date of Birth:  1976/02/15 Medical Record Number:  [id number]   Reason for Referral:  hospital stroke follow up    CHIEF COMPLAINT:  No chief complaint on file.   HPI:  Today, 01/10/2020, [name] returns for stroke follow-up.  She has been stable since prior visit without residual stroke deficits or new stroke/TIA symptoms. Concern for antiphospholipid syndrome during admission with repeat levels showed continued concern for APS therefore evaluated by hematology.  Hematology officially diagnosed antiphospholipid antibody syndrome and recommended initiating Coumadin which will be managed by PCP but this has not been started for unknown reasons.  At this time, she has remained on aspirin 81 mg daily and atorvastatin 40 mg daily without side effects.  Blood pressure today ***.  No concerns at this time.    History provided for reference purposes only Initial visit 08/08/2019 JM: Ms. [name] is a 44 year old female who is being seen today for hospital follow-up.  She has been doing well since discharge without residual stroke deficits or new/reoccurring stroke/TIA symptoms.  Continues on DAPT despite 3-week recommendation but denies bleeding or bruising.  Continues on atorvastatin without myalgias.  Blood pressure today satisfactory at 125/85.  She does endorse compliance with all recommended medications. Hypercoagulable work-up during admission showed high titer of cardiolipin and beta-2 glycoprotein IgG antibodies highly concerning for antiphospholipid syndrome and recommended repeat lab work in 3 months to confirm diagnosis and possible hematology referral.  She had recent follow-up with [facility] for right shoulder dislocation.  Reports healing well, remains in sling and believes she will be starting therapy soon.  No further concerns at this  time.  Hospital admission 06/19/2019: [name] a 44 y.o.femalewith history ofhypertension, heart murmur,medication non-compliance, admitted to [facility] for[facility] urgency 12/29 and found to have a spontaneous abortion. In hospital developed sudden onset difficutly with speech with confusion.  Evaluated by stroke team with stroke work-up revealing shower left MCA embolic infarcts status post TPA in young female with possible spontaneous abortion secondary to unknown source.  Hypercoagulable work-up showed high titer cardiolipin and beta-2 glycoprotein IgG antibodies highly concerning for antiphospholipid syndrome and recommended repeat level in 3 months.  TEE negative for endocarditis, PFO or SOE.  UDS positive for THC.  Recommended DAPT for 3 weeks and aspirin alone.  Hypertensive urgency upon arrival BP 201/124 noncompliant with medications prior to admission.  LDL 93 initiate atorvastatin 40 mg daily.  Possible seizure-like activity with negative EEG did not feel as though initiation of AED indicated at that time.  Advised to follow-up with OB/GYN outpatient for possible spontaneous abortion.  Sustained right shoulder anterior dislocation likely traumatic as patient was placed to the ground by her brother tried to do CPR when she had seizure-like activity.  She was advised to continue to follow with Dr. Veverly Fells outpatient for further management.  No other stroke risk factors and no prior history of stroke.  Other active problems include hypokalemia and thrombocytosis.  Discharged home in stable condition without therapy needs.         ROS:   14 system review of systems performed and negative with exception of see HPI  PMH:  Past Medical History:  Diagnosis Date  . Blood in stool    hx of  . Heart murmur   . Hypertension     PSH:  Past Surgical History:  Procedure Laterality Date  . BUBBLE STUDY  06/25/2019   Procedure: BUBBLE STUDY;  Surgeon: Donato Heinz, MD;   Location: [place];  Service: Endoscopy;;  . TEE WITHOUT CARDIOVERSION N/A 06/25/2019   Procedure: TRANSESOPHAGEAL ECHOCARDIOGRAM (TEE);  Surgeon: Donato Heinz, MD;  Location: [facility] ENDOSCOPY;  Service: Endoscopy;  Laterality: N/A;    Social History:  Social History   Socioeconomic History  . Marital status: Divorced    Spouse name: Not on file  . Number of children: Not on file  . Years of education: Not on file  . Highest education level: Not on file  Occupational History  . Occupation: respiratory therapistory therapist    Comment: [employer]  Tobacco Use  . Smoking status: Never Smoker  . Smokeless tobacco: Never Used  Substance and Sexual Activity  . Alcohol use: No  . Drug use: No  . Sexual activity: Not on file  Other Topics Concern  . Not on file  Social History Narrative   Divorced lives w/ cousin   Regular exercise; yes   Social Determinants of Health   Financial Resource Strain:   . Difficulty of Paying Living Expenses:   Food Insecurity:   . Worried About Charity fundraiser in the Last Year:   . Arboriculturist in the Last Year:   Transportation Needs:   . Film/video editor (Medical):   Marland Kitchen Lack of Transportation (Non-Medical):   Physical Activity:   . Days of Exercise per Week:   . Minutes of Exercise per Session:   Stress:   . Feeling of Stress :   Social Connections:   . Frequency of Communication with Friends and Family:   . Frequency of Social Gatherings with Friends and Family:   . Attends Religious Services:   . Active Member of Clubs or Organizations:   . Attends Archivist Meetings:   Marland Kitchen Marital Status:   Intimate Partner Violence:   . Fear of Current or Ex-Partner:   . Emotionally Abused:   Marland Kitchen Physically Abused:   . Sexually Abused:     Family History:  Family History  Problem Relation Age of Onset  . Hypertension Mother   . Hypertension Father   . Diabetes Father        diet controlled    Medications:     Current Outpatient Medications on File Prior to Visit  Medication Sig Dispense Refill  . amLODipine (NORVASC) 5 MG tablet Take 1 tablet (5 mg total) by mouth daily. 90 tablet 1  . aspirin EC 81 MG EC tablet Take 1 tablet (81 mg total) by mouth daily.    Marland Kitchen atorvastatin (LIPITOR) 40 MG tablet Take 1 tablet (40 mg total) by mouth daily at 6 PM. 30 tablet 2  . ferrous sulfate 325 (65 FE) MG EC tablet Take 1 tablet (325 mg total) by mouth daily with breakfast. 30 tablet 3  . labetalol (NORMODYNE) 200 MG tablet Take 1 tablet (200 mg total) by mouth 2 (two) times daily. 180 tablet 1   No current facility-administered medications on file prior to visit.    Allergies:  No Known Allergies   Physical Exam  There were no vitals filed for this visit. There is no height or weight on file to calculate BMI. No exam data present  General: well developed, well nourished,  pleasant middle-aged African-American female, seated, in no evident distress Head: head normocephalic and atraumatic.   Neck: supple with no carotid or supraclavicular  bruits Cardiovascular: regular rate and rhythm, no murmurs Musculoskeletal: no deformity; right arm sling d/t recent right shoulder dislocation Skin:  no rash/petichiae Vascular:  Normal pulses all extremities   Neurologic Exam Mental Status: Awake and fully alert.   Normal speech and language.  Oriented to place and time. Recent and remote memory intact. Attention span, concentration and fund of knowledge appropriate. Mood and affect appropriate.  Cranial Nerves: Fundoscopic exam reveals sharp disc margins. Pupils equal, briskly reactive to light. Extraocular movements full without nystagmus. Visual fields full to confrontation. Hearing intact. Facial sensation intact. Face, tongue, palate moves normally and symmetrically.  Motor: Normal bulk and tone. Normal strength in all tested extremity muscles. Sensory.: intact to touch , pinprick , position and vibratory  sensation.  Coordination: Rapid alternating movements normal in all extremities. Finger-to-nose and heel-to-shin performed accurately bilaterally. Gait and Station: Arises from chair without difficulty. Stance is normal. Gait demonstrates normal stride length and balance Reflexes: 1+ and symmetric. Toes downgoing.     NIHSS  0 Modified Rankin  0    Diagnostic Data (Labs, Imaging, Testing)   Code Stroke CT head No acute abnormality. ASPECTS 10.   CTA head &neck Unremarkable   CT perfusion negative  MRI small shower embolic infarct L parietal and parietal occipital L MCA infarcts.   2D EchoEF 60-65%. No source of embolus  LE venous doppler no DVT  TEEneg forLibman-Sacks endocarditis as manifestation of phospholipid syndrome, no PFO or other SOE  Hypercoagulable labs  Beta-2 glycoprotein 65(h), CL Ab pending    LDL93  HgbA1c5.5  UDS positive for THC    ASSESSMENT: Phyllis Hopkins is a 44 y.o. year old female presented initially on 06/19/2019 with hypertensive urgency and found to have spontaneous abortion with sudden onset of speech difficulty and confusion during admission on 06/20/2019.  Stroke work-up revealed shower left MCA embolic infarcts s/p TPA secondary to unknown source.  High suspicion for antiphospholipid syndrome with high titer of cardiolipin and beta-2 glycoprotein IgG antibodies and recommended repeat labs in 3 months.  Vascular risk factors include HTN or medication noncompliance and HLD.  Recovered well from a stroke standpoint without residual deficits.    PLAN:  1. Left MCA stroke: Continue aspirin 81 mg daily  and atorvastatin for secondary stroke prevention.  Advised to discontinue Plavix at this time as prolonged DAPT not indicated.  Maintain strict control of hypertension with blood pressure goal below 130/90, diabetes with hemoglobin A1c goal below 6.5% and cholesterol with LDL cholesterol (bad cholesterol) goal below 70 mg/dL.  I also  advised the patient to eat a healthy diet with plenty of whole grains, cereals, fruits and vegetables, exercise regularly with at least 30 minutes of continuous activity daily and maintain ideal body weight. 2. HTN: Advised to continue current treatment regimen. Advised to continue to monitor at home along with continued follow-up with PCP for management 3. HLD: Advised to continue current treatment regimen along with continued follow-up with PCP for future prescribing and monitoring of lipid panel 4. ?  Antiphospholipid syndrome: We will repeat labs at the end of March/beginning of April as recommended 67-month follow-up to confirm diagnosis.  If confirmed, will refer to hematology    Follow up in 4 months or call earlier if needed   Greater than 50% of time during this 45 minute visit was spent on counseling, explanation of diagnosis of left MCA stroke, reviewing risk factor management of HTN, HLD, medication noncompliance and questionable antiphospholipid syndrome, planning of further  management along with potential future management, and discussion with patient answered all questions to satisfaction    Frann Rider, [facility]  [facility] [address] [place], [place] [address]  Phone [phone] Fax [phone] Note: This document was prepared with digital dictation and possible smart phrase technology. Any transcriptional errors that result from this process are unintentional.

## 2020-01-11 ENCOUNTER — Encounter: Payer: Self-pay | Admitting: Family

## 2020-01-11 MED ORDER — AMLODIPINE BESYLATE 5 MG PO TABS: 5.0000 mg | ORAL_TABLET | Freq: Every day | ORAL | 1 refills | Status: DC

## 2020-01-11 MED ORDER — FERROUS SULFATE 325 (65 FE) MG PO TBEC: 325.0000 mg | DELAYED_RELEASE_TABLET | Freq: Every day | ORAL | 3 refills | Status: DC

## 2020-01-13 ENCOUNTER — Other Ambulatory Visit: Payer: Self-pay | Admitting: Hematology and Oncology

## 2020-01-13 DIAGNOSIS — D500 Iron deficiency anemia secondary to blood loss (chronic): Secondary | ICD-10-CM

## 2020-01-13 NOTE — Progress Notes (Signed)
[place] [phone]   [phone]  PROGRESS NOTE  Patient Care Team: Debbrah Alar, NP as PCP - General (Internal Medicine)  Hematological/Oncological History # Positive APS Antibodies in Setting of CVA 1) 06/20/2019: Left MCA stroke. Started on aspirin therapy 81mg  daily.   2) 06/21/2019: Beta-2-glycoprotein IgG 65, Anticardiolipin IgG 66 3) 10/04/2019: as part of neurology workup, patient underwent APS testing. Found to have Anticardiolipin IgG elevated at 79, Beta 2 Glycoprotein IgG elevated at 88.  4) 11/14/2019: establish care with Dr. Lorenso Courier   Interval History:  Phyllis Hopkins 44 y.o. female with medical history significant for antiphospholipid antibody syndrome with CVA presents for a follow up visit. The patient's last visit was on 11/14/2019. In the interim since the last visit the patient has unfortunately not been started on systemic anticoagulation with coumadin therapy.   On exam today Ms. Speers notes that she has been doing quite well and has had no major issues, concerns, or complaints since her last visit in May.  She notes that she has had an increase in her weight overall.  She notes that she has been taking the iron pills and that her energy levels are "okay".  She notes that she has not had any issues with constipation or stomach upset while taking this medication.  She does note that her cycles have been regular during the interim.  Since her last visit.  She has of the first few days are quite heavy and she does change her pads every 2 hours.  She is not having any other overt sources of bleeding such as nosebleeds, GI bleeding, or bruising.  A full 10 point ROS is listed below.  MEDICAL HISTORY:  Past Medical History:  Diagnosis Date  . Blood in stool    hx of  . Heart murmur   . Hypertension     SURGICAL HISTORY: Past Surgical History:  Procedure Laterality Date  . BUBBLE STUDY  06/25/2019   Procedure: BUBBLE  STUDY;  Surgeon: Donato Heinz, MD;  Location: [place];  Service: Endoscopy;;  . TEE WITHOUT CARDIOVERSION N/A 06/25/2019   Procedure: TRANSESOPHAGEAL ECHOCARDIOGRAM (TEE);  Surgeon: Donato Heinz, MD;  Location: [facility] ENDOSCOPY;  Service: Endoscopy;  Laterality: N/A;    SOCIAL HISTORY: Social History   Socioeconomic History  . Marital status: Divorced    Spouse name: Not on file  . Number of children: Not on file  . Years of education: Not on file  . Highest education level: Not on file  Occupational History  . Occupation: respiratory therapistory therapist    Comment:  [employer]  Tobacco Use  . Smoking status: Never Smoker  . Smokeless tobacco: Never Used  Substance and Sexual Activity  . Alcohol use: No  . Drug use: No  . Sexual activity: Not on file  Other Topics Concern  . Not on file  Social History Narrative   Divorced lives w/ cousin   Regular exercise; yes   Social Determinants of Health   Financial Resource Strain:   . Difficulty of Paying Living Expenses:   Food Insecurity:   . Worried About Charity fundraiser in the Last Year:   . Arboriculturist in the Last Year:   Transportation Needs:   . Film/video editor (Medical):   Marland Kitchen Lack of Transportation (Non-Medical):   Physical Activity:   . Days of Exercise per Week:   . Minutes of Exercise per Session:   Stress:   .  Feeling of Stress :   Social Connections:   . Frequency of Communication with Friends and Family:   . Frequency of Social Gatherings with Friends and Family:   . Attends Religious Services:   . Active Member of Clubs or Organizations:   . Attends Archivist Meetings:   Marland Kitchen Marital Status:   Intimate Partner Violence:   . Fear of Current or Ex-Partner:   . Emotionally Abused:   Marland Kitchen Physically Abused:   . Sexually Abused:     FAMILY HISTORY: Family History  Problem Relation Age of Onset  . Hypertension Mother   . Hypertension Father   . Diabetes Father          diet controlled    ALLERGIES:  has No Known Allergies.  MEDICATIONS:  Current Outpatient Medications  Medication Sig Dispense Refill  . amLODipine (NORVASC) 5 MG tablet Take 1 tablet (5 mg total) by mouth daily. 90 tablet 1  . aspirin EC 81 MG EC tablet Take 1 tablet (81 mg total) by mouth daily.    Marland Kitchen atorvastatin (LIPITOR) 40 MG tablet Take 1 tablet (40 mg total) by mouth daily at 6 PM. 30 tablet 2  . enoxaparin (LOVENOX) 80 MG/0.8ML injection Inject 0.75 mLs (75 mg total) into the skin every 12 (twelve) hours. 16 mL 0  . ferrous sulfate 325 (65 FE) MG EC tablet Take 1 tablet (325 mg total) by mouth daily with breakfast. 30 tablet 3  . labetalol (NORMODYNE) 200 MG tablet Take 1 tablet (200 mg total) by mouth 2 (two) times daily. 180 tablet 1  . warfarin (COUMADIN) 5 MG tablet Take 1 tablet (5 mg total) by mouth daily. 30 tablet 3   No current facility-administered medications for this visit.    REVIEW OF SYSTEMS:   Constitutional: ( - ) fevers, ( - )  chills , ( - ) night sweats Eyes: ( - ) blurriness of vision, ( - ) double vision, ( - ) watery eyes Ears, nose, mouth, throat, and face: ( - ) mucositis, ( - ) sore throat Respiratory: ( - ) cough, ( - ) dyspnea, ( - ) wheezes Cardiovascular: ( - ) palpitation, ( - ) chest discomfort, ( - ) lower extremity swelling Gastrointestinal:  ( - ) nausea, ( - ) heartburn, ( - ) change in bowel habits Skin: ( - ) abnormal skin rashes Lymphatics: ( - ) new lymphadenopathy, ( - ) easy bruising Neurological: ( - ) numbness, ( - ) tingling, ( - ) new weaknesses Behavioral/Psych: ( - ) mood change, ( - ) new changes  All other systems were reviewed with the patient and are negative.  PHYSICAL EXAMINATION: ECOG PERFORMANCE STATUS: 0 - Asymptomatic  Vitals:   01/14/20 0959  BP: (!) 147/96  Pulse: 57  Resp: 18  Temp: 98.1 F (36.7 C)  SpO2: 97%   Filed Weights   01/14/20 0959  Weight: 167 lb 11.2 oz (76.1 kg)    GENERAL: well  appearing middle aged [place] American female, alert, no distress and comfortable SKIN: skin color, texture, turgor are normal, no rashes or significant lesions EYES: conjunctiva are pink and non-injected, sclera clear LUNGS: clear to auscultation and percussion with normal breathing effort HEART: regular rate & rhythm and no murmurs and no lower extremity edema Musculoskeletal: no cyanosis of digits and no clubbing  PSYCH: alert & oriented x 3, fluent speech NEURO: no focal motor/sensory deficits  LABORATORY DATA:  I have reviewed the data as  listed CBC Latest Ref Rng & Units 01/14/2020 11/14/2019 11/06/2019  WBC 4.0 - 10.5 K/uL 4.8 4.9 5.5  Hemoglobin 12.0 - 15.0 g/dL 11.2(L) 11.9(L) 11.2(L)  Hematocrit 36 - 46 % 34.0(L) 36.4 32.8(L)  Platelets 150 - 400 K/uL 446(H) 508(H) 532.0(H)    CMP Latest Ref Rng & Units 01/14/2020 11/14/2019 11/06/2019  Glucose 70 - 99 mg/dL 95 81 88  BUN 6 - 20 mg/dL 12 11 9   Creatinine 0.44 - 1.00 mg/dL 0.87 0.83 0.86  Sodium 135 - 145 mmol/L 137 140 138  Potassium 3.5 - 5.1 mmol/L 4.1 4.0 4.2  Chloride 98 - 111 mmol/L 108 108 106  CO2 22 - 32 mmol/L 20(L) 23 26  Calcium 8.9 - 10.3 mg/dL 9.4 9.6 9.7  Total Protein 6.5 - 8.1 g/dL 7.3 7.7 6.7  Total Bilirubin 0.3 - 1.2 mg/dL 0.4 0.6 0.5  Alkaline Phos 38 - 126 U/L 51 56 56  AST 15 - 41 U/L 14(L) 16 13  ALT 0 - 44 U/L 11 12 10     RADIOGRAPHIC STUDIES: I have personally reviewed the radiological images as listed and agreed with the findings in the report. No results found.  ASSESSMENT & PLAN Phyllis Hopkins 44 y.o. female with medical history significant for antiphospholipid antibody syndrome with CVA presents for a follow up visit.  Her review of the labs, discussion with the patient her findings are most consistent with antiphospholipid antibody syndrome as well as an unrelated iron deficiency anemia secondary to GYN blood loss.  Unfortunately in the interim she has not been started up on Coumadin  therapy, however we have connected with the patient's PCP in order to assure that she does start this as soon as possible.  Additionally her hemoglobin has actually decreased from her last visit down to 11.2 and her platelets are only mildly decreased at 446.  Her serum iron levels do appear to be improving with increase in ferritin up to 16 and a saturation ratio now 47%.  Our plan will be to have the patient connected to Coumadin clinic as well as return to our clinic in 3 months time in order to assure that she is having stable iron levels.  # Positive APS Antibodies in Setting of CVA # Antiphospholipid Antibody Syndrome --Patient currently meets clinical/laboratory criteria and the diagnosis of APS was confirmed with repeat testing of antibodies in 12 weeks time. --after CVA with APS it is recommended that patients be on therapy with coumadin. Other DOACs are inferior to coumadin in the setting of APS --will reach out to PCP to have patient connected with coumadin clinic through her primary care. Unfortunately we do no offer coumadin clinic services at the [facility]. --RTC in 3 months to assure patient is on coumadin and to assess her iron deficiency anemia.   #Iron Deficiency Anemia 2/2 to Blood Loss --reports heavy menstrual cycles, with changing of pads q 2 hours during her periods.  --repeat iron panel for the patient today --continue ferrous sulfate 325mg  PO daily with a source of vitamin C --if unable to maintain Hgb with PO therapy will consider IV iron therapy instead.  --continue to monitor  No orders of the defined types were placed in this encounter.   All questions were answered. The patient knows to call the clinic with any problems, questions or concerns.  A total of more than 30 minutes were spent on this encounter and over half of that time was spent on counseling and  coordination of care as outlined above.   Ledell Peoples, MD Department of  Hematology/Oncology [place] [facility] Phone: [phone] Pager: [phone] Email: [email or web address]  01/15/2020 1:49 PM

## 2020-01-14 ENCOUNTER — Inpatient Hospital Stay: Payer: Federal, State, Local not specified - PPO

## 2020-01-14 ENCOUNTER — Other Ambulatory Visit: Payer: Self-pay

## 2020-01-14 ENCOUNTER — Inpatient Hospital Stay
Payer: Federal, State, Local not specified - PPO | Attending: Hematology and Oncology | Admitting: Hematology and Oncology

## 2020-01-14 ENCOUNTER — Telehealth: Payer: Self-pay

## 2020-01-14 VITALS — BP 147/96 | HR 57 | Temp 98.1°F | Resp 18 | Ht 62.0 in | Wt 167.7 lb

## 2020-01-14 DIAGNOSIS — N920 Excessive and frequent menstruation with regular cycle: Secondary | ICD-10-CM | POA: Diagnosis not present

## 2020-01-14 DIAGNOSIS — D500 Iron deficiency anemia secondary to blood loss (chronic): Secondary | ICD-10-CM

## 2020-01-14 DIAGNOSIS — I10 Essential (primary) hypertension: Secondary | ICD-10-CM | POA: Insufficient documentation

## 2020-01-14 DIAGNOSIS — Z7901 Long term (current) use of anticoagulants: Secondary | ICD-10-CM | POA: Diagnosis not present

## 2020-01-14 DIAGNOSIS — D6861 Antiphospholipid syndrome: Secondary | ICD-10-CM

## 2020-01-14 DIAGNOSIS — Z79899 Other long term (current) drug therapy: Secondary | ICD-10-CM | POA: Insufficient documentation

## 2020-01-14 DIAGNOSIS — Z8673 Personal history of transient ischemic attack (TIA), and cerebral infarction without residual deficits: Secondary | ICD-10-CM | POA: Diagnosis not present

## 2020-01-14 DIAGNOSIS — Z7982 Long term (current) use of aspirin: Secondary | ICD-10-CM | POA: Insufficient documentation

## 2020-01-14 LAB — CMP (CANCER CENTER ONLY)
ALT: 11 U/L (ref 0–44)
AST: 14 U/L — ABNORMAL LOW (ref 15–41)
Albumin: 3.7 g/dL (ref 3.5–5.0)
Alkaline Phosphatase: 51 U/L (ref 38–126)
Anion gap: 9 (ref 5–15)
BUN: 12 mg/dL (ref 6–20)
CO2: 20 mmol/L — ABNORMAL LOW (ref 22–32)
Calcium: 9.4 mg/dL (ref 8.9–10.3)
Chloride: 108 mmol/L (ref 98–111)
Creatinine: 0.87 mg/dL (ref 0.44–1.00)
GFR, Est AFR Am: >60 mL/min (ref >60)
GFR, Estimated: >60 mL/min (ref >60)
Glucose, Bld: 95 mg/dL (ref 70–99)
Potassium: 4.1 mmol/L (ref 3.5–5.1)
Sodium: 137 mmol/L (ref 135–145)
Total Bilirubin: 0.4 mg/dL (ref 0.3–1.2)
Total Protein: 7.3 g/dL (ref 6.5–8.1)

## 2020-01-14 LAB — FERRITIN: Ferritin: 16 ng/mL (ref 11–307)

## 2020-01-14 LAB — CBC WITH DIFFERENTIAL (CANCER CENTER ONLY)
Abs Immature Granulocytes: 0.01 10*3/uL (ref 0.00–0.07)
Basophils Absolute: 0 10*3/uL (ref 0.0–0.1)
Basophils Relative: 0 %
Eosinophils Absolute: 0.1 10*3/uL (ref 0.0–0.5)
Eosinophils Relative: 2 %
HCT: 34 % — ABNORMAL LOW (ref 36.0–46.0)
Hemoglobin: 11.2 g/dL — ABNORMAL LOW (ref 12.0–15.0)
Immature Granulocytes: 0 %
Lymphocytes Relative: 39 %
Lymphs Abs: 1.9 10*3/uL (ref 0.7–4.0)
MCH: 29.6 pg (ref 26.0–34.0)
MCHC: 32.9 g/dL (ref 30.0–36.0)
MCV: 89.7 fL (ref 80.0–100.0)
Monocytes Absolute: 0.4 10*3/uL (ref 0.1–1.0)
Monocytes Relative: 9 %
Neutro Abs: 2.4 10*3/uL (ref 1.7–7.7)
Neutrophils Relative %: 50 %
Platelet Count: 446 10*3/uL — ABNORMAL HIGH (ref 150–400)
RBC: 3.79 MIL/uL — ABNORMAL LOW (ref 3.87–5.11)
RDW: 14.7 % (ref 11.5–15.5)
WBC Count: 4.8 10*3/uL (ref 4.0–10.5)
nRBC: 0 % (ref 0.0–0.2)

## 2020-01-14 LAB — IRON AND TIBC
Iron: 132 ug/dL (ref 41–142)
Saturation Ratios: 47 % (ref 21–57)
TIBC: 284 ug/dL (ref 236–444)
UIBC: 151 ug/dL (ref 120–384)

## 2020-01-14 LAB — RETIC PANEL
Immature Retic Fract: 12 % (ref 2.3–15.9)
RBC.: 3.77 MIL/uL — ABNORMAL LOW (ref 3.87–5.11)
Retic Count, Absolute: 46.7 10*3/uL (ref 19.0–186.0)
Retic Ct Pct: 1.2 % (ref 0.4–3.1)
Reticulocyte Hemoglobin: 34.8 pg (ref >27.9)

## 2020-01-14 MED ORDER — WARFARIN SODIUM 5 MG PO TABS
5.0000 mg | ORAL_TABLET | Freq: Every day | ORAL | 3 refills | Status: DC
Start: 2020-01-14 — End: 2020-03-26

## 2020-01-14 MED ORDER — ENOXAPARIN SODIUM 80 MG/0.8ML ~~LOC~~ SOLN: 1.0000 mg/kg | Freq: Two times a day (BID) | SUBCUTANEOUS | 0 refills | Status: DC

## 2020-01-14 NOTE — Progress Notes (Signed)
TCT pt's PCP, Debbrah Alar to advise that pt still needs to be started on coumadin and managed there with PT/INR. Spoke with nurse and was told they would schedule pt for a follow up visit this week or next.

## 2020-01-14 NOTE — Telephone Encounter (Signed)
Beth from [facility] called to ask if pcp can start patient in blood thinners or refer to coumadin clinic. (see previous message from 11-15-19). Please advise

## 2020-01-14 NOTE — Telephone Encounter (Signed)
Per pharmacy medications are getting filled and she will only need to pay $7.00 for the generic of levonox. Left detail message for patient to call me back asap to set up appointment for tomorrow for levonox teaching.

## 2020-01-14 NOTE — Telephone Encounter (Signed)
I reviewed plan with Dr. Lorenso Courier patient's hematologist. He is requesting that patient start lovenox bridge/coumadin with goal INR 2-3 for new diagnosis of antiphospholipid syndrome.   Rod Holler, please schedule patient a nurse visit for or tomorrow AM for lovenox teaching.  She will need to pick up medication at the pharmacy.  Would you please check that this does not need prior auth with pharmacy?  Start coumadin 5mg  once daily tonight.    Needs INR check on Friday please.   Percell Miller- could you please review check this INR on Friday as I will be out of the office.  She will need to stay on lovenox until INR is 2.0.

## 2020-01-14 NOTE — Telephone Encounter (Signed)
Ok. Will review. Thanks,

## 2020-01-15 ENCOUNTER — Telehealth: Payer: Self-pay | Admitting: Hematology and Oncology

## 2020-01-15 ENCOUNTER — Encounter: Payer: Self-pay | Admitting: Hematology and Oncology

## 2020-01-15 ENCOUNTER — Ambulatory Visit (INDEPENDENT_AMBULATORY_CARE_PROVIDER_SITE_OTHER): Payer: Federal, State, Local not specified - PPO

## 2020-01-15 DIAGNOSIS — D6861 Antiphospholipid syndrome: Secondary | ICD-10-CM | POA: Diagnosis not present

## 2020-01-15 NOTE — Telephone Encounter (Signed)
Left another detail message again this morning for patient to call me back today to bring her in for levonox administration instructions

## 2020-01-15 NOTE — Progress Notes (Addendum)
Patient here to be instructed on how to inject Lovenox subcutaneously. Instructions given to patient and  she was instructed on how to get to the indicated dosage of 0.75mg . She verbalized understanding.  She was able to administer Loveox 0.75mg  subq in the abdomen without any dificulties.  She was instructed to do this every 12 hours.  Patient provided medication. Enoxaparin Sodium Injection 80mg /0.8 ml lot # [id number]/ exp date 07-22-2020 Reyne Dumas [id number].  She was scheduled to come in Friday for [place] INR check.  Her goal INR is 2-3. Patient has been instructed to d/c lovenox once she gets to her goal level.   Reviewed and agree.  Nance Pear NP

## 2020-01-15 NOTE — Telephone Encounter (Signed)
Scheduled per 7/26 los. Pt is aware of appt time and date.

## 2020-01-15 NOTE — Telephone Encounter (Signed)
Patient called back, she will pick up medication and will try to be here in the next hour.

## 2020-01-17 ENCOUNTER — Telehealth: Payer: Self-pay | Admitting: *Deleted

## 2020-01-17 NOTE — Telephone Encounter (Signed)
-----   Message from Orson Slick, MD sent at 01/16/2020  5:14 PM EDT ----- Please let Mrs. Acocella know that her iron levels are still a little low. Recommend she continue to take the oral iron. Please reinforce the appropriate way to take the iron.  ----- Message ----- From: Buel Ream, Lab [facility] [place] Sent: 01/14/2020   9:54 AM EDT To: Orson Slick, MD

## 2020-01-17 NOTE — Telephone Encounter (Signed)
TCT patient regarding her lab results from 01/14/20.  Spoke with her and advised her iron levels are still a little low, Advised to continue oral iron and follow up in 3 months. She voiced understanding

## 2020-01-18 ENCOUNTER — Ambulatory Visit (INDEPENDENT_AMBULATORY_CARE_PROVIDER_SITE_OTHER): Payer: Federal, State, Local not specified - PPO

## 2020-01-18 ENCOUNTER — Other Ambulatory Visit: Payer: Self-pay

## 2020-01-18 DIAGNOSIS — D6861 Antiphospholipid syndrome: Secondary | ICD-10-CM | POA: Diagnosis not present

## 2020-01-18 LAB — POCT INR: INR: 1.1 — AB (ref 2.0–3.0)

## 2020-01-18 MED ORDER — WARFARIN SODIUM 7.5 MG PO TABS
7.5000 mg | ORAL_TABLET | Freq: Every day | ORAL | 0 refills | Status: DC
Start: 2020-01-18 — End: 2020-07-28

## 2020-01-18 NOTE — Progress Notes (Addendum)
Pt here for INR check per Debbrah Alar  Goal INR = 2.0-3.0  Last INR =  Pt currently takes Coumadin 5 mg daily she is also on Lovenox bridge.   Pt denies recent antibiotics, no dietary changes and no unusual bruising / bleeding.  INR today = 1.1  I advise  to increase to  7.5 mg  daily continue  lovenox. New dose has been sent to the pharmacy for her. Follow up scheduled for next week for repeat INR.  Mackie Pai, PA-C

## 2020-01-23 ENCOUNTER — Encounter: Payer: Self-pay | Admitting: Family

## 2020-01-23 MED ORDER — ENOXAPARIN SODIUM 80 MG/0.8ML ~~LOC~~ SOLN: 1.0000 mg/kg | Freq: Two times a day (BID) | SUBCUTANEOUS | 0 refills | Status: DC

## 2020-01-29 ENCOUNTER — Other Ambulatory Visit: Payer: Self-pay

## 2020-01-29 ENCOUNTER — Ambulatory Visit (INDEPENDENT_AMBULATORY_CARE_PROVIDER_SITE_OTHER): Payer: Federal, State, Local not specified - PPO

## 2020-01-29 DIAGNOSIS — D6861 Antiphospholipid syndrome: Secondary | ICD-10-CM

## 2020-01-29 LAB — POCT INR: INR: 3 (ref 2.0–3.0)

## 2020-01-29 NOTE — Progress Notes (Signed)
Reviewed  And agree Ann Held, DO

## 2020-01-29 NOTE — Progress Notes (Signed)
Pt here for INR check per Debbrah Alar  Goal INR = 2.0-3.0  Last INR = 1.1  Pt currently takes Coumadin 7.5 mg daily, Lovenox 0.75 bid  Pt denies recent antibiotics, no dietary changes and no unusual bruising / bleeding.  INR today = 3.0  Pt advised per Dr. Etter Sjogren, DOD to stop Lovenox continue coumadin and re check again Friday. Appointment has been scheduled.

## 2020-02-01 ENCOUNTER — Other Ambulatory Visit: Payer: Self-pay

## 2020-02-01 ENCOUNTER — Ambulatory Visit (INDEPENDENT_AMBULATORY_CARE_PROVIDER_SITE_OTHER): Payer: Federal, State, Local not specified - PPO

## 2020-02-01 DIAGNOSIS — D6861 Antiphospholipid syndrome: Secondary | ICD-10-CM

## 2020-02-01 LAB — POCT INR: INR: 3.6 — AB (ref 2.0–3.0)

## 2020-02-01 NOTE — Progress Notes (Signed)
Per BB, pt states Mychart message could be sent with instructions. Mychart  message sent

## 2020-02-01 NOTE — Progress Notes (Addendum)
Pt here for INR check per Debbrah Alar  Goal INR = 2.0-3.0  Last INR = 3.0  Pt currently takes Coumadin 7.5 mg daily. She has stopped the lovenox since last visit per Dr. Etter Sjogren.   Pt denies recent antibiotics, no dietary changes and no unusual bruising / bleeding.  INR today = 3.6  Please advise and I will send patient message on mychart. Sent to [facility] in absence of PCP.    Reviewed.   Debbrah Alar NP

## 2020-02-13 ENCOUNTER — Ambulatory Visit (INDEPENDENT_AMBULATORY_CARE_PROVIDER_SITE_OTHER): Payer: Federal, State, Local not specified - PPO | Admitting: *Deleted

## 2020-02-13 ENCOUNTER — Other Ambulatory Visit: Payer: Self-pay

## 2020-02-13 DIAGNOSIS — D6861 Antiphospholipid syndrome: Secondary | ICD-10-CM | POA: Diagnosis not present

## 2020-02-13 LAB — POCT INR: INR: 1.3 — AB (ref 2.0–3.0)

## 2020-02-13 NOTE — Patient Instructions (Signed)
Monday- 5mg    Tuesday- 7.5mg    Wednesday- 5mg   Thursday- 7.5mg   Friday- 5mg   Saturday- 7.5mg    Sunday- 7.5mg   Recheck next week.

## 2020-02-13 NOTE — Progress Notes (Signed)
Pt here for INR check per Debbrah Alar  Goal INR = 2.0-3.0  Last INR = 3.6  Pt currently takes coumadin 7.5 on Monday, Wednesday, and Friday and 5mg  on all the other days.  Patient to take coumadin this past Monday.    Pt denies recent antibiotics, no dietary changes and no unusual bruising / bleeding.  INR today =1.3  Per Melissa Take 5 mg Monday, Wednesday, and Friday.   7.5mg  on Tuesday, Thursday, Saturday, and Sunday.  Patient scheduled for next Friday (patient had to work next Wednesday).

## 2020-02-14 ENCOUNTER — Encounter: Payer: Self-pay | Admitting: Family

## 2020-02-22 ENCOUNTER — Ambulatory Visit: Payer: Federal, State, Local not specified - PPO

## 2020-02-22 ENCOUNTER — Other Ambulatory Visit: Payer: Self-pay

## 2020-02-22 DIAGNOSIS — D6861 Antiphospholipid syndrome: Secondary | ICD-10-CM

## 2020-02-22 NOTE — Progress Notes (Signed)
Pt here for INR check per Melissa  Goal INR = 2.0-3.0  Last INR = 1.3  Pt currently takes Coumadin Take 5 mg Monday, Wednesday, and Friday.   7.5mg  on Tuesday, Thursday, Saturday, and Sunday.  Pt denies recent antibiotics, no dietary changes and no unusual bruising / bleeding.  INR today = 1.8  Pt advised per Melissa recommends taking 7.5 MG daily except for Wednesday. Take 5 MG on Wednesday. Follow up in 1 week

## 2020-03-05 ENCOUNTER — Other Ambulatory Visit: Payer: Self-pay

## 2020-03-05 ENCOUNTER — Ambulatory Visit (INDEPENDENT_AMBULATORY_CARE_PROVIDER_SITE_OTHER): Payer: Federal, State, Local not specified - PPO

## 2020-03-05 DIAGNOSIS — D6861 Antiphospholipid syndrome: Secondary | ICD-10-CM

## 2020-03-05 LAB — POCT INR: INR: 3.4 — AB (ref 2.0–3.0)

## 2020-03-05 NOTE — Progress Notes (Signed)
Pt here for INR check per Melissa  Goal INR = 2.0-3.0  Last INR = 1.8  Pt currently takes 7.5 MG daily except for Wednesday. Take 5 MG on Wednesday.    Pt denies recent antibiotics, no dietary changes and no unusual bruising / bleeding.  INR today = 3.4  Per Melissa take 5 mg daily except for Wednesday and Saturday take 7.5 mg.  Repeat in 1 week.

## 2020-03-05 NOTE — Patient Instructions (Signed)
New Warfarin Dose  Monday  5mg   Tuesday  5mg   Wednesday  7.5  Thursday  5mg   Friday  5mg   Saturday 7.5mg   Sunday  5mg 

## 2020-03-13 ENCOUNTER — Other Ambulatory Visit: Payer: Self-pay

## 2020-03-13 ENCOUNTER — Ambulatory Visit (INDEPENDENT_AMBULATORY_CARE_PROVIDER_SITE_OTHER): Payer: Federal, State, Local not specified - PPO

## 2020-03-13 DIAGNOSIS — D6861 Antiphospholipid syndrome: Secondary | ICD-10-CM

## 2020-03-13 LAB — POCT INR: INR: 1.6 — AB (ref 2.0–3.0)

## 2020-03-13 NOTE — Patient Instructions (Signed)
Pt advised per Dr. Charlett Blake to take 7.5 Friday-Sunday-Tuesday-Thursday and 5 on Saturday-Monday and Wednesdays

## 2020-03-13 NOTE — Progress Notes (Signed)
Pt here for INR check per  Goal INR =2.0 to 3.0  Last INR =3.4  Pt currently takes Coumadin 5 mg daily except for Wednesday and Saturday take 7.5 mg.  Repeat in 1 week.  Pt denies recent antibiotics, no dietary changes and no unusual bruising / bleeding.  INR today =1.6   Pt advised per Dr. Charlett Blake to take 7.5 Friday-Sunday-Tuesday-Thursday and 5 on Saturday-Monday and Wednesdays

## 2020-03-19 ENCOUNTER — Ambulatory Visit (INDEPENDENT_AMBULATORY_CARE_PROVIDER_SITE_OTHER): Payer: Federal, State, Local not specified - PPO

## 2020-03-19 ENCOUNTER — Other Ambulatory Visit: Payer: Self-pay

## 2020-03-19 DIAGNOSIS — Z7901 Long term (current) use of anticoagulants: Secondary | ICD-10-CM | POA: Diagnosis not present

## 2020-03-19 LAB — POCT INR: INR: 1.5 — AB (ref 2.0–3.0)

## 2020-03-19 NOTE — Progress Notes (Addendum)
Pt here today for INR check.   Last INR: 1.6  Goal: 2.0-3.0  Pt current taking Coumadin 7.5mg  4 times weekly, and Coumadin 5mg  3 times weekly.   Pt has not missed dose, no bleeding, changes in diet.   She has already had her dose today, 7.5mg .   INR: 1.5   Per Dr. Nani Ravens: take 7.5mg  daily, recheck 1 week. Pt currently driving home, she will call back to reschedule INR check for next week.    Reviewed. Crosby Oyster Wendling 4:35 PM 03/19/20

## 2020-03-26 ENCOUNTER — Encounter: Payer: Self-pay | Admitting: Family

## 2020-03-26 MED ORDER — WARFARIN SODIUM 5 MG PO TABS
5.0000 mg | ORAL_TABLET | Freq: Every day | ORAL | 3 refills | Status: DC
Start: 2020-03-26 — End: 2020-07-02

## 2020-03-26 MED ORDER — FERROUS SULFATE 325 (65 FE) MG PO TBEC: 325.0000 mg | DELAYED_RELEASE_TABLET | Freq: Every day | ORAL | 3 refills | Status: AC

## 2020-03-27 ENCOUNTER — Other Ambulatory Visit: Payer: Self-pay

## 2020-03-27 ENCOUNTER — Ambulatory Visit (INDEPENDENT_AMBULATORY_CARE_PROVIDER_SITE_OTHER): Payer: Federal, State, Local not specified - PPO

## 2020-03-27 DIAGNOSIS — Z7901 Long term (current) use of anticoagulants: Secondary | ICD-10-CM | POA: Diagnosis not present

## 2020-03-27 LAB — POCT INR: INR: 2.2 (ref 2.0–3.0)

## 2020-03-27 NOTE — Patient Instructions (Signed)
Pt advised per  Dr. Charlett Blake continue same dosage and return in 2 weeks for INR check.

## 2020-03-27 NOTE — Progress Notes (Signed)
Pt here for INR check per  Goal INR =2.0 to 3.0  Last INR =1.5  Pt currently takes Coumadin Per Dr. Nani Ravens: take 7.5mg  daily.   Pt denies recent antibiotics, no dietary changes and no unusual bruising / bleeding.  INR today =2.2   Pt advised per  Dr. Charlett Blake continue same dosage and return in 2 weeks for INR check.  Patient will call from home when she has her work schedule.

## 2020-04-09 ENCOUNTER — Other Ambulatory Visit: Payer: Self-pay

## 2020-04-09 ENCOUNTER — Ambulatory Visit (INDEPENDENT_AMBULATORY_CARE_PROVIDER_SITE_OTHER): Payer: Federal, State, Local not specified - PPO | Admitting: *Deleted

## 2020-04-09 DIAGNOSIS — Z7901 Long term (current) use of anticoagulants: Secondary | ICD-10-CM

## 2020-04-09 LAB — POCT INR: INR: 1.4 — AB (ref 2.0–3.0)

## 2020-04-09 NOTE — Progress Notes (Signed)
  Pt here for INR check per Melissa  Goal INR =2.0 to 3.0  Last INR =2.2  Pt currently takes coumadin 7.5mg  daily.   Pt denies recent antibiotics, no dietary changes and no unusual bruising / bleeding.  Pt missed Monday's dose  INR today =1.4  Per Melissa would like pt to continue to take 7.5mg  daily of coumadin and to return in one week.

## 2020-04-10 ENCOUNTER — Ambulatory Visit: Payer: Self-pay

## 2020-04-15 ENCOUNTER — Other Ambulatory Visit: Payer: Self-pay | Admitting: Hematology and Oncology

## 2020-04-15 DIAGNOSIS — D500 Iron deficiency anemia secondary to blood loss (chronic): Secondary | ICD-10-CM

## 2020-04-16 ENCOUNTER — Inpatient Hospital Stay: Payer: Federal, State, Local not specified - PPO

## 2020-04-16 ENCOUNTER — Inpatient Hospital Stay
Payer: Federal, State, Local not specified - PPO | Attending: Hematology and Oncology | Admitting: Hematology and Oncology

## 2020-04-16 ENCOUNTER — Ambulatory Visit (INDEPENDENT_AMBULATORY_CARE_PROVIDER_SITE_OTHER): Payer: Federal, State, Local not specified - PPO

## 2020-04-16 ENCOUNTER — Other Ambulatory Visit: Payer: Self-pay

## 2020-04-16 ENCOUNTER — Encounter: Payer: Self-pay | Admitting: Hematology and Oncology

## 2020-04-16 VITALS — BP 151/101 | HR 57 | Temp 98.5°F | Resp 20 | Ht 62.0 in | Wt 167.5 lb

## 2020-04-16 DIAGNOSIS — Z79899 Other long term (current) drug therapy: Secondary | ICD-10-CM | POA: Insufficient documentation

## 2020-04-16 DIAGNOSIS — Z8673 Personal history of transient ischemic attack (TIA), and cerebral infarction without residual deficits: Secondary | ICD-10-CM | POA: Insufficient documentation

## 2020-04-16 DIAGNOSIS — I10 Essential (primary) hypertension: Secondary | ICD-10-CM | POA: Insufficient documentation

## 2020-04-16 DIAGNOSIS — D500 Iron deficiency anemia secondary to blood loss (chronic): Secondary | ICD-10-CM | POA: Diagnosis not present

## 2020-04-16 DIAGNOSIS — Z7901 Long term (current) use of anticoagulants: Secondary | ICD-10-CM | POA: Insufficient documentation

## 2020-04-16 DIAGNOSIS — D6861 Antiphospholipid syndrome: Secondary | ICD-10-CM | POA: Diagnosis not present

## 2020-04-16 DIAGNOSIS — N920 Excessive and frequent menstruation with regular cycle: Secondary | ICD-10-CM | POA: Insufficient documentation

## 2020-04-16 LAB — RETIC PANEL
Immature Retic Fract: 8 % (ref 2.3–15.9)
RBC.: 4.34 MIL/uL (ref 3.87–5.11)
Retic Count, Absolute: 49 10*3/uL (ref 19.0–186.0)
Retic Ct Pct: 1.1 % (ref 0.4–3.1)
Reticulocyte Hemoglobin: 35.5 pg (ref >27.9)

## 2020-04-16 LAB — CBC WITH DIFFERENTIAL (CANCER CENTER ONLY)
Abs Immature Granulocytes: 0 10*3/uL (ref 0.00–0.07)
Basophils Absolute: 0 10*3/uL (ref 0.0–0.1)
Basophils Relative: 0 %
Eosinophils Absolute: 0.1 10*3/uL (ref 0.0–0.5)
Eosinophils Relative: 1 %
HCT: 38.6 % (ref 36.0–46.0)
Hemoglobin: 12.8 g/dL (ref 12.0–15.0)
Immature Granulocytes: 0 %
Lymphocytes Relative: 47 %
Lymphs Abs: 2.1 10*3/uL (ref 0.7–4.0)
MCH: 29.6 pg (ref 26.0–34.0)
MCHC: 33.2 g/dL (ref 30.0–36.0)
MCV: 89.1 fL (ref 80.0–100.0)
Monocytes Absolute: 0.3 10*3/uL (ref 0.1–1.0)
Monocytes Relative: 6 %
Neutro Abs: 2.1 10*3/uL (ref 1.7–7.7)
Neutrophils Relative %: 46 %
Platelet Count: 478 10*3/uL — ABNORMAL HIGH (ref 150–400)
RBC: 4.33 MIL/uL (ref 3.87–5.11)
RDW: 14.4 % (ref 11.5–15.5)
WBC Count: 4.6 10*3/uL (ref 4.0–10.5)
nRBC: 0 % (ref 0.0–0.2)

## 2020-04-16 LAB — CMP (CANCER CENTER ONLY)
ALT: 11 U/L (ref 0–44)
AST: 13 U/L — ABNORMAL LOW (ref 15–41)
Albumin: 3.9 g/dL (ref 3.5–5.0)
Alkaline Phosphatase: 50 U/L (ref 38–126)
Anion gap: 7 (ref 5–15)
BUN: 11 mg/dL (ref 6–20)
CO2: 26 mmol/L (ref 22–32)
Calcium: 9.2 mg/dL (ref 8.9–10.3)
Chloride: 105 mmol/L (ref 98–111)
Creatinine: 0.87 mg/dL (ref 0.44–1.00)
GFR, Estimated: >60 mL/min (ref >60)
Glucose, Bld: 126 mg/dL — ABNORMAL HIGH (ref 70–99)
Potassium: 3.6 mmol/L (ref 3.5–5.1)
Sodium: 138 mmol/L (ref 135–145)
Total Bilirubin: 0.4 mg/dL (ref 0.3–1.2)
Total Protein: 7.5 g/dL (ref 6.5–8.1)

## 2020-04-16 LAB — FERRITIN: Ferritin: 24 ng/mL (ref 11–307)

## 2020-04-16 LAB — IRON AND TIBC
Iron: 149 ug/dL — ABNORMAL HIGH (ref 41–142)
Saturation Ratios: 51 % (ref 21–57)
TIBC: 291 ug/dL (ref 236–444)
UIBC: 142 ug/dL (ref 120–384)

## 2020-04-16 LAB — POCT INR: INR: 2.9 (ref 2.0–3.0)

## 2020-04-16 NOTE — Progress Notes (Signed)
[place] [phone]   [phone]  PROGRESS NOTE  Patient Care Team: Debbrah Alar, NP as PCP - General (Internal Medicine)  Hematological/Oncological History # Positive APS Antibodies in Setting of CVA 1) 06/20/2019: Left MCA stroke. Started on aspirin therapy 81mg  daily.   2) 06/21/2019: Beta-2-glycoprotein IgG 65, Anticardiolipin IgG 66 3) 10/04/2019: as part of neurology workup, patient underwent APS testing. Found to have Anticardiolipin IgG elevated at 79, Beta 2 Glycoprotein IgG elevated at 88.  4) 11/14/2019: establish care with Dr. Lorenso Courier   Interval History:  Phyllis Hopkins 44 y.o. female with medical history significant for antiphospholipid antibody syndrome with CVA presents for a follow up visit. The patient's last visit was on 01/14/2020. In the interim since the last visit the patient has been started on systemic anticoagulation with coumadin therapy.   On exam today Phyllis Hopkins notes that she has tolerated Coumadin therapy well.  She reports that she was therapeutic on her last check with an INR of 2.9.  She has not had any trouble with bleeding, bruising, or dark stools while taking this therapy.  She reports that she has not noticed any difference while taking iron pills, but it has not been causing her any constipation or stomach upset.  She thinks her menstrual cycles may be slightly longer while on the Coumadin therapy, but otherwise they have not changed much.  She does feel like she is tired often, but she reports that she stays up late and it is "not excessive".  She denies having any fevers, chills, sweats, nausea, vomiting or diarrhea.  A full 10 point ROS is listed below.  MEDICAL HISTORY:  Past Medical History:  Diagnosis Date  . Blood in stool    hx of  . Heart murmur   . Hypertension     SURGICAL HISTORY: Past Surgical History:  Procedure Laterality Date  . BUBBLE STUDY  06/25/2019   Procedure: BUBBLE  STUDY;  Surgeon: Donato Heinz, MD;  Location: [facility];  Service: Endoscopy;;  . TEE WITHOUT CARDIOVERSION N/A 06/25/2019   Procedure: TRANSESOPHAGEAL ECHOCARDIOGRAM (TEE);  Surgeon: Donato Heinz, MD;  Location: [facility] ENDOSCOPY;  Service: Endoscopy;  Laterality: N/A;    SOCIAL HISTORY: Social History   Socioeconomic History  . Marital status: Divorced    Spouse name: Not on file  . Number of children: Not on file  . Years of education: Not on file  . Highest education level: Not on file  Occupational History  . Occupation: respiratory therapistory therapist    Comment: [employer]  Tobacco Use  . Smoking status: Never Smoker  . Smokeless tobacco: Never Used  Substance and Sexual Activity  . Alcohol use: No  . Drug use: No  . Sexual activity: Not on file  Other Topics Concern  . Not on file  Social History Narrative   Divorced lives w/ cousin   Regular exercise; yes   Social Determinants of Health   Financial Resource Strain:   . Difficulty of Paying Living Expenses: Not on file  Food Insecurity:   . Worried About Charity fundraiser in the Last Year: Not on file  . Ran Out of Food in the Last Year: Not on file  Transportation Needs:   . Lack of Transportation (Medical): Not on file  . Lack of Transportation (Non-Medical): Not on file  Physical Activity:   . Days of Exercise per Week: Not on file  . Minutes of Exercise per Session: Not  on file  Stress:   . Feeling of Stress : Not on file  Social Connections:   . Frequency of Communication with Friends and Family: Not on file  . Frequency of Social Gatherings with Friends and Family: Not on file  . Attends Religious Services: Not on file  . Active Member of Clubs or Organizations: Not on file  . Attends Archivist Meetings: Not on file  . Marital Status: Not on file  Intimate Partner Violence:   . Fear of Current or Ex-Partner: Not on file  . Emotionally Abused: Not on file  .  Physically Abused: Not on file  . Sexually Abused: Not on file    FAMILY HISTORY: Family History  Problem Relation Age of Onset  . Hypertension Mother   . Hypertension Father   . Diabetes Father        diet controlled    ALLERGIES:  has No Known Allergies.  MEDICATIONS:  Current Outpatient Medications  Medication Sig Dispense Refill  . amLODipine (NORVASC) 5 MG tablet Take 1 tablet (5 mg total) by mouth daily. 90 tablet 1  . aspirin EC 81 MG EC tablet Take 1 tablet (81 mg total) by mouth daily.    Marland Kitchen atorvastatin (LIPITOR) 40 MG tablet Take 1 tablet (40 mg total) by mouth daily at 6 PM. 30 tablet 2  . enoxaparin (LOVENOX) 80 MG/0.8ML injection Inject 0.75 mLs (75 mg total) into the skin every 12 (twelve) hours. 16 mL 0  . ferrous sulfate 325 (65 FE) MG EC tablet Take 1 tablet (325 mg total) by mouth daily with breakfast. 30 tablet 3  . labetalol (NORMODYNE) 200 MG tablet Take 1 tablet (200 mg total) by mouth 2 (two) times daily. 180 tablet 1  . warfarin (COUMADIN) 5 MG tablet Take 1 tablet (5 mg total) by mouth daily. 30 tablet 3  . warfarin (COUMADIN) 7.5 MG tablet Take 1 tablet (7.5 mg total) by mouth daily. 30 tablet 0   No current facility-administered medications for this visit.    REVIEW OF SYSTEMS:   Constitutional: ( - ) fevers, ( - )  chills , ( - ) night sweats Eyes: ( - ) blurriness of vision, ( - ) double vision, ( - ) watery eyes Ears, nose, mouth, throat, and face: ( - ) mucositis, ( - ) sore throat Respiratory: ( - ) cough, ( - ) dyspnea, ( - ) wheezes Cardiovascular: ( - ) palpitation, ( - ) chest discomfort, ( - ) lower extremity swelling Gastrointestinal:  ( - ) nausea, ( - ) heartburn, ( - ) change in bowel habits Skin: ( - ) abnormal skin rashes Lymphatics: ( - ) new lymphadenopathy, ( - ) easy bruising Neurological: ( - ) numbness, ( - ) tingling, ( - ) new weaknesses Behavioral/Psych: ( - ) mood change, ( - ) new changes  All other systems were reviewed  with the patient and are negative.  PHYSICAL EXAMINATION: ECOG PERFORMANCE STATUS: 0 - Asymptomatic  Vitals:   04/16/20 1149  BP: (!) 151/101  Pulse: (!) 57  Resp: 20  Temp: 98.5 F (36.9 C)  SpO2: 100%   Filed Weights   04/16/20 1149  Weight: 167 lb 8 oz (76 kg)    GENERAL: well appearing middle aged [place] American female, alert, no distress and comfortable SKIN: skin color, texture, turgor are normal, no rashes or significant lesions EYES: conjunctiva are pink and non-injected, sclera clear LUNGS: clear to auscultation and percussion with  normal breathing effort HEART: regular rate & rhythm and no murmurs and no lower extremity edema Musculoskeletal: no cyanosis of digits and no clubbing  PSYCH: alert & oriented x 3, fluent speech NEURO: no focal motor/sensory deficits  LABORATORY DATA:  I have reviewed the data as listed CBC Latest Ref Rng & Units 04/16/2020 01/14/2020 11/14/2019  WBC 4.0 - 10.5 K/uL 4.6 4.8 4.9  Hemoglobin 12.0 - 15.0 g/dL 12.8 11.2(L) 11.9(L)  Hematocrit 36 - 46 % 38.6 34.0(L) 36.4  Platelets 150 - 400 K/uL 478(H) 446(H) 508(H)    CMP Latest Ref Rng & Units 04/16/2020 01/14/2020 11/14/2019  Glucose 70 - 99 mg/dL 126(H) 95 81  BUN 6 - 20 mg/dL 11 12 11   Creatinine 0.44 - 1.00 mg/dL 0.87 0.87 0.83  Sodium 135 - 145 mmol/L 138 137 140  Potassium 3.5 - 5.1 mmol/L 3.6 4.1 4.0  Chloride 98 - 111 mmol/L 105 108 108  CO2 22 - 32 mmol/L 26 20(L) 23  Calcium 8.9 - 10.3 mg/dL 9.2 9.4 9.6  Total Protein 6.5 - 8.1 g/dL 7.5 7.3 7.7  Total Bilirubin 0.3 - 1.2 mg/dL 0.4 0.4 0.6  Alkaline Phos 38 - 126 U/L 50 51 56  AST 15 - 41 U/L 13(L) 14(L) 16  ALT 0 - 44 U/L 11 11 12     RADIOGRAPHIC STUDIES: I have personally reviewed the radiological images as listed and agreed with the findings in the report. No results found.  ASSESSMENT & PLAN Phyllis Hopkins 44 y.o. female with medical history significant for antiphospholipid antibody syndrome with CVA  presents for a follow up visit.  Her review of the labs, discussion with the patient her findings are most consistent with antiphospholipid antibody syndrome as well as an unrelated iron deficiency anemia secondary to GYN blood loss.    In the interim she has been started on Coumadin therapy and has tolerated it well.  Additionally her hemoglobin has improved from prior.  Her serum iron levels do appear to be improving with increase in ferritin up to 24 and a saturation ratio now 54%.  Our plan will be to have the patient continue to follow with the Coumadin clinic as well as return to our clinic in 3 months time in order to assure that she is having stable iron levels.  # Positive APS Antibodies in Setting of CVA # Antiphospholipid Antibody Syndrome --Patient currently meets clinical/laboratory criteria and the diagnosis of APS was confirmed with repeat testing of antibodies in 12 weeks time. --after CVA with APS it is recommended that patients be on therapy with coumadin. Other DOACs are inferior to coumadin in the setting of APS --continue with coumadin clinic through her primary care. Unfortunately we do no offer coumadin clinic services at the [facility]. --RTC in 3 months to assure patient is stable on coumadin and to assess her iron deficiency anemia.   #Iron Deficiency Anemia 2/2 to Blood Loss --reports heavy menstrual cycles as cause of blood loss/iron deficiency --repeat iron panel for the patient today --continue ferrous sulfate 325mg  PO daily with a source of vitamin C --if unable to maintain Hgb with PO therapy will consider IV iron therapy instead.  --continue to monitor  No orders of the defined types were placed in this encounter.   All questions were answered. The patient knows to call the clinic with any problems, questions or concerns.  A total of more than 30 minutes were spent on this encounter and over half of  that time was spent on counseling and  coordination of care as outlined above.   Ledell Peoples, MD Department of Hematology/Oncology [place] [facility] Phone: [phone] Pager: [phone] Email: [email or web address]  04/16/2020 3:27 PM

## 2020-04-16 NOTE — Progress Notes (Signed)
Pt here for INR check per Melissa  Goal INR =2.0 to 3.0  Last INR =1.4  Pt currently takes coumadin 7.5mg  daily.  Pt denies recent antibiotics, no dietary changes and no unusual bruising / bleeding.    INR today =2.9  Per Melissa would like pt taking 7.5mg  of coumadin Mon,Tues,Thur, Fri, Sat, and Sun. Pt will be 5mg  coumadin Wednesday only.  Pt will call for appt. In 2 weeks

## 2020-04-17 ENCOUNTER — Telehealth: Payer: Self-pay | Admitting: Hematology and Oncology

## 2020-04-17 NOTE — Telephone Encounter (Signed)
Scheduled per los. Called, not able to leave msg. Mailed printout  

## 2020-04-29 ENCOUNTER — Other Ambulatory Visit: Payer: Self-pay

## 2020-04-29 ENCOUNTER — Ambulatory Visit (INDEPENDENT_AMBULATORY_CARE_PROVIDER_SITE_OTHER): Payer: Federal, State, Local not specified - PPO

## 2020-04-29 DIAGNOSIS — Z7901 Long term (current) use of anticoagulants: Secondary | ICD-10-CM | POA: Diagnosis not present

## 2020-04-29 LAB — POCT INR: INR: 2 (ref 2.0–3.0)

## 2020-04-29 NOTE — Progress Notes (Signed)
Pt here for INR check per Earlie Counts  Goal INR = 2.0-3.0  Last INR = 2.9  Pt currently takes Coumadin 7.5 daily except 5 mg Wednesday.   Pt denies recent antibiotics, no dietary changes and no unusual bruising / bleeding.Missed sundays dose by accident .  INR today = 2.0  Advised to continue current regimen however follow up in a week. She felt more comfortable coming back next week since she missed that one dose, to have inr rechecked. She states she will call back to schedule as she does not have her work schedule.

## 2020-05-07 ENCOUNTER — Encounter: Payer: Self-pay | Admitting: Family

## 2020-05-13 ENCOUNTER — Ambulatory Visit (INDEPENDENT_AMBULATORY_CARE_PROVIDER_SITE_OTHER): Payer: Federal, State, Local not specified - PPO

## 2020-05-13 ENCOUNTER — Other Ambulatory Visit: Payer: Self-pay

## 2020-05-13 ENCOUNTER — Telehealth: Payer: Self-pay | Admitting: Family

## 2020-05-13 DIAGNOSIS — Z7901 Long term (current) use of anticoagulants: Secondary | ICD-10-CM | POA: Diagnosis not present

## 2020-05-13 LAB — POCT INR: INR: 3.5 — AB (ref 2.0–3.0)

## 2020-05-13 NOTE — Telephone Encounter (Signed)
See mychart.  

## 2020-05-13 NOTE — Progress Notes (Signed)
Pt here for INR check per Debbrah Alar  Goal INR = 2.0-3.0   Last INR =2.0  Pt currently takes Coumadin 7.5 mg daily, 5 mg Wednesday.   Pt denies recent antibiotics, she states she does eat leafy greens about once a week. No unusual bruising / bleeding.No missed doses.   INR today = 3.5  Please advise on further instructions, she requested mychart message be sent to her.

## 2020-06-14 ENCOUNTER — Other Ambulatory Visit: Payer: Self-pay | Admitting: Family

## 2020-07-02 ENCOUNTER — Other Ambulatory Visit: Payer: Self-pay | Admitting: Family

## 2020-07-02 NOTE — Telephone Encounter (Signed)
Patient called back and was scheduled for 07-08-20

## 2020-07-02 NOTE — Telephone Encounter (Signed)
Please advise pt that she is VERY overdue or follow up of her high coumadin level. Please schedule a nurse visit asap for her to check INR. In order to safely continue to prescribe coumadin, she needs to follow up as directed please.  Refill sent.

## 2020-07-02 NOTE — Telephone Encounter (Signed)
Lvm for patient to call back about this prescription

## 2020-07-04 ENCOUNTER — Encounter: Payer: Self-pay | Admitting: Family

## 2020-07-08 ENCOUNTER — Ambulatory Visit: Payer: Federal, State, Local not specified - PPO

## 2020-07-14 ENCOUNTER — Other Ambulatory Visit: Payer: Self-pay | Admitting: Family

## 2020-07-15 ENCOUNTER — Other Ambulatory Visit: Payer: Self-pay | Admitting: *Deleted

## 2020-07-15 DIAGNOSIS — D500 Iron deficiency anemia secondary to blood loss (chronic): Secondary | ICD-10-CM

## 2020-07-16 ENCOUNTER — Other Ambulatory Visit: Payer: Self-pay

## 2020-07-16 ENCOUNTER — Ambulatory Visit (INDEPENDENT_AMBULATORY_CARE_PROVIDER_SITE_OTHER): Payer: Federal, State, Local not specified - PPO | Admitting: Family Medicine

## 2020-07-16 DIAGNOSIS — Z7901 Long term (current) use of anticoagulants: Secondary | ICD-10-CM | POA: Diagnosis not present

## 2020-07-16 LAB — POCT INR: INR: 2.5 (ref 2.0–3.0)

## 2020-07-16 NOTE — Progress Notes (Signed)
Pt here for INR check per Debbrah Alar (DOD- Dr.Copland)  Goal INR = 2.0-3.0        Last INR =3.5  Pt currently takes Coumadin 7.5 mg daily, 5 mg Wednesday.   Pt denies recent antibiotics, she states she does eat leafy greens about once a week. No unusual bruising / bleeding.No missed doses.   INR today = 2.5  Per Dr. Grace Isaac), pt is to continue regimen and return in 1 month for INR check. Pt will call to schedule.

## 2020-07-17 ENCOUNTER — Inpatient Hospital Stay: Payer: Federal, State, Local not specified - PPO | Attending: Hematology and Oncology

## 2020-07-17 ENCOUNTER — Encounter: Payer: Self-pay | Admitting: Hematology and Oncology

## 2020-07-17 ENCOUNTER — Other Ambulatory Visit: Payer: Self-pay

## 2020-07-17 ENCOUNTER — Inpatient Hospital Stay (HOSPITAL_BASED_OUTPATIENT_CLINIC_OR_DEPARTMENT_OTHER): Payer: Federal, State, Local not specified - PPO | Admitting: Hematology and Oncology

## 2020-07-17 VITALS — BP 161/101 | HR 78 | Temp 99.0°F | Resp 16 | Ht 62.0 in | Wt 167.4 lb

## 2020-07-17 DIAGNOSIS — D6861 Antiphospholipid syndrome: Secondary | ICD-10-CM | POA: Insufficient documentation

## 2020-07-17 DIAGNOSIS — Z8673 Personal history of transient ischemic attack (TIA), and cerebral infarction without residual deficits: Secondary | ICD-10-CM | POA: Insufficient documentation

## 2020-07-17 DIAGNOSIS — D500 Iron deficiency anemia secondary to blood loss (chronic): Secondary | ICD-10-CM | POA: Diagnosis not present

## 2020-07-17 DIAGNOSIS — R760 Raised antibody titer: Secondary | ICD-10-CM | POA: Diagnosis not present

## 2020-07-17 DIAGNOSIS — N920 Excessive and frequent menstruation with regular cycle: Secondary | ICD-10-CM | POA: Diagnosis not present

## 2020-07-17 DIAGNOSIS — Z79899 Other long term (current) drug therapy: Secondary | ICD-10-CM | POA: Diagnosis not present

## 2020-07-17 DIAGNOSIS — I10 Essential (primary) hypertension: Secondary | ICD-10-CM | POA: Diagnosis not present

## 2020-07-17 DIAGNOSIS — Z7901 Long term (current) use of anticoagulants: Secondary | ICD-10-CM | POA: Insufficient documentation

## 2020-07-17 LAB — CMP (CANCER CENTER ONLY)
ALT: 7 U/L (ref 0–44)
AST: 13 U/L — ABNORMAL LOW (ref 15–41)
Albumin: 3.7 g/dL (ref 3.5–5.0)
Alkaline Phosphatase: 50 U/L (ref 38–126)
Anion gap: 7 (ref 5–15)
BUN: 10 mg/dL (ref 6–20)
CO2: 24 mmol/L (ref 22–32)
Calcium: 9 mg/dL (ref 8.9–10.3)
Chloride: 107 mmol/L (ref 98–111)
Creatinine: 0.89 mg/dL (ref 0.44–1.00)
GFR, Estimated: >60 mL/min (ref >60)
Glucose, Bld: 95 mg/dL (ref 70–99)
Potassium: 3.7 mmol/L (ref 3.5–5.1)
Sodium: 138 mmol/L (ref 135–145)
Total Bilirubin: 0.5 mg/dL (ref 0.3–1.2)
Total Protein: 7.6 g/dL (ref 6.5–8.1)

## 2020-07-17 LAB — CBC WITH DIFFERENTIAL (CANCER CENTER ONLY)
Abs Immature Granulocytes: 0.01 10*3/uL (ref 0.00–0.07)
Basophils Absolute: 0 10*3/uL (ref 0.0–0.1)
Basophils Relative: 0 %
Eosinophils Absolute: 0 10*3/uL (ref 0.0–0.5)
Eosinophils Relative: 1 %
HCT: 38.4 % (ref 36.0–46.0)
Hemoglobin: 12.5 g/dL (ref 12.0–15.0)
Immature Granulocytes: 0 %
Lymphocytes Relative: 35 %
Lymphs Abs: 1.8 10*3/uL (ref 0.7–4.0)
MCH: 29.4 pg (ref 26.0–34.0)
MCHC: 32.6 g/dL (ref 30.0–36.0)
MCV: 90.4 fL (ref 80.0–100.0)
Monocytes Absolute: 0.4 10*3/uL (ref 0.1–1.0)
Monocytes Relative: 8 %
Neutro Abs: 2.9 10*3/uL (ref 1.7–7.7)
Neutrophils Relative %: 56 %
Platelet Count: 483 10*3/uL — ABNORMAL HIGH (ref 150–400)
RBC: 4.25 MIL/uL (ref 3.87–5.11)
RDW: 14.1 % (ref 11.5–15.5)
WBC Count: 5.2 10*3/uL (ref 4.0–10.5)
nRBC: 0 % (ref 0.0–0.2)

## 2020-07-17 LAB — IRON AND TIBC
Iron: 60 ug/dL (ref 41–142)
Saturation Ratios: 21 % (ref 21–57)
TIBC: 285 ug/dL (ref 236–444)
UIBC: 226 ug/dL (ref 120–384)

## 2020-07-17 LAB — FERRITIN: Ferritin: 32 ng/mL (ref 11–307)

## 2020-07-17 LAB — RETIC PANEL
Immature Retic Fract: 7.1 % (ref 2.3–15.9)
RBC.: 4.17 MIL/uL (ref 3.87–5.11)
Retic Count, Absolute: 41.7 10*3/uL (ref 19.0–186.0)
Retic Ct Pct: 1 % (ref 0.4–3.1)
Reticulocyte Hemoglobin: 35.5 pg (ref >27.9)

## 2020-07-17 NOTE — Progress Notes (Signed)
[place] [phone]   [phone]  PROGRESS NOTE  Patient Care Team: Debbrah Alar, NP as PCP - General (Internal Medicine)  Hematological/Oncological History # Positive APS Antibodies in Setting of CVA 1) 06/20/2019: Left MCA stroke. Started on aspirin therapy 81mg  daily.   2) 06/21/2019: Beta-2-glycoprotein IgG 65, Anticardiolipin IgG 66 3) 10/04/2019: as part of neurology workup, patient underwent APS testing. Found to have Anticardiolipin IgG elevated at 79, Beta 2 Glycoprotein IgG elevated at 88.  4) 11/14/2019: establish care with Dr. Lorenso Courier   Interval History:  Phyllis Hopkins 45 y.o. female with medical history significant for antiphospholipid antibody syndrome with CVA presents for a follow up visit. The patient's last visit was on 04/16/2020. In the interim since the last visit the patient has been continued on systemic anticoagulation with coumadin therapy.   On exam today Phyllis Hopkins notes he has been tolerating Coumadin quite well.  She reports that she is taking warfarin 7.5 mg daily with the exception of Wednesday where she takes 5 mg.  She is not had any issues with bleeding, bruising, or dark stools as a result of this therapy.  She reports that her menstrual cycles "last little bit longer" and that she is losing little bit more blood, but not a dramatic amount.  She denies having any issues with fevers, chills, sweats, nausea, vomiting or diarrhea.  She notes that she is tolerating the iron pills well with no constipation or stomach upset.  She has no other questions or concerns today.  A full 10 point ROS is listed below  MEDICAL HISTORY:  Past Medical History:  Diagnosis Date  . Blood in stool    hx of  . Heart murmur   . Hypertension     SURGICAL HISTORY: Past Surgical History:  Procedure Laterality Date  . BUBBLE STUDY  06/25/2019   Procedure: BUBBLE STUDY;  Surgeon: Donato Heinz, MD;  Location: [facility];  Service: Endoscopy;;  . TEE WITHOUT CARDIOVERSION N/A 06/25/2019   Procedure: TRANSESOPHAGEAL ECHOCARDIOGRAM (TEE);  Surgeon: Donato Heinz, MD;  Location: [facility] ENDOSCOPY;  Service: Endoscopy;  Laterality: N/A;    SOCIAL HISTORY: Social History   Socioeconomic History  . Marital status: Divorced    Spouse name: Not on file  . Number of children: Not on file  . Years of education: Not on file  . Highest education level: Not on file  Occupational History  . Occupation: respiratory therapistory therapist    Comment: [employer]  Tobacco Use  . Smoking status: Never Smoker  . Smokeless tobacco: Never Used  Substance and Sexual Activity  . Alcohol use: No  . Drug use: No  . Sexual activity: Not on file  Other Topics Concern  . Not on file  Social History Narrative   Divorced lives w/ cousin   Regular exercise; yes   Social Determinants of Health   Financial Resource Strain: Not on file  Food Insecurity: Not on file  Transportation Needs: Not on file  Physical Activity: Not on file  Stress: Not on file  Social Connections: Not on file  Intimate Partner Violence: Not on file    FAMILY HISTORY: Family History  Problem Relation Age of Onset  . Hypertension Mother   . Hypertension Father   . Diabetes Father        diet controlled    ALLERGIES:  has No Known Allergies.  MEDICATIONS:  Current Outpatient Medications  Medication Sig Dispense Refill  .  amLODipine (NORVASC) 5 MG tablet TAKE 1 TABLET(5 MG) BY MOUTH DAILY 90 tablet 2  . aspirin EC 81 MG EC tablet Take 1 tablet (81 mg total) by mouth daily.    Marland Kitchen atorvastatin (LIPITOR) 40 MG tablet Take 1 tablet (40 mg total) by mouth daily at 6 PM. 30 tablet 2  . enoxaparin (LOVENOX) 80 MG/0.8ML injection Inject 0.75 mLs (75 mg total) into the skin every 12 (twelve) hours. 16 mL 0  . ferrous sulfate 325 (65 FE) MG EC tablet Take 1 tablet (325 mg total) by mouth daily with breakfast. 30 tablet 3  . labetalol  (NORMODYNE) 200 MG tablet TAKE 1 TABLET(200 MG) BY MOUTH TWICE DAILY 180 tablet 0  . warfarin (COUMADIN) 5 MG tablet TAKE 1 TABLET(5 MG) BY MOUTH DAILY 30 tablet 0  . warfarin (COUMADIN) 7.5 MG tablet Take 1 tablet (7.5 mg total) by mouth daily. 30 tablet 0   No current facility-administered medications for this visit.    REVIEW OF SYSTEMS:   Constitutional: ( - ) fevers, ( - )  chills , ( - ) night sweats Eyes: ( - ) blurriness of vision, ( - ) double vision, ( - ) watery eyes Ears, nose, mouth, throat, and face: ( - ) mucositis, ( - ) sore throat Respiratory: ( - ) cough, ( - ) dyspnea, ( - ) wheezes Cardiovascular: ( - ) palpitation, ( - ) chest discomfort, ( - ) lower extremity swelling Gastrointestinal:  ( - ) nausea, ( - ) heartburn, ( - ) change in bowel habits Skin: ( - ) abnormal skin rashes Lymphatics: ( - ) new lymphadenopathy, ( - ) easy bruising Neurological: ( - ) numbness, ( - ) tingling, ( - ) new weaknesses Behavioral/Psych: ( - ) mood change, ( - ) new changes  All other systems were reviewed with the patient and are negative.  PHYSICAL EXAMINATION: ECOG PERFORMANCE STATUS: 0 - Asymptomatic  Vitals:   07/17/20 1111  BP: (!) 161/101  Pulse: 78  Resp: 16  Temp: 99 F (37.2 C)  SpO2: 99%   Filed Weights   07/17/20 1111  Weight: 167 lb 6.4 oz (75.9 kg)    GENERAL: well appearing middle aged [place] American female, alert, no distress and comfortable SKIN: skin color, texture, turgor are normal, no rashes or significant lesions EYES: conjunctiva are pink and non-injected, sclera clear LUNGS: clear to auscultation and percussion with normal breathing effort HEART: regular rate & rhythm and no murmurs and no lower extremity edema Musculoskeletal: no cyanosis of digits and no clubbing  PSYCH: alert & oriented x 3, fluent speech NEURO: no focal motor/sensory deficits  LABORATORY DATA:  I have reviewed the data as listed CBC Latest Ref Rng & Units 07/17/2020  04/16/2020 01/14/2020  WBC 4.0 - 10.5 K/uL 5.2 4.6 4.8  Hemoglobin 12.0 - 15.0 g/dL 12.5 12.8 11.2(L)  Hematocrit 36.0 - 46.0 % 38.4 38.6 34.0(L)  Platelets 150 - 400 K/uL 483(H) 478(H) 446(H)    CMP Latest Ref Rng & Units 07/17/2020 04/16/2020 01/14/2020  Glucose 70 - 99 mg/dL 95 126(H) 95  BUN 6 - 20 mg/dL 10 11 12   Creatinine 0.44 - 1.00 mg/dL 0.89 0.87 0.87  Sodium 135 - 145 mmol/L 138 138 137  Potassium 3.5 - 5.1 mmol/L 3.7 3.6 4.1  Chloride 98 - 111 mmol/L 107 105 108  CO2 22 - 32 mmol/L 24 26 20(L)  Calcium 8.9 - 10.3 mg/dL 9.0 9.2 9.4  Total Protein 6.5 -  8.1 g/dL 7.6 7.5 7.3  Total Bilirubin 0.3 - 1.2 mg/dL 0.5 0.4 0.4  Alkaline Phos 38 - 126 U/L 50 50 51  AST 15 - 41 U/L 13(L) 13(L) 14(L)  ALT 0 - 44 U/L 7 11 11     RADIOGRAPHIC STUDIES: I have personally reviewed the radiological images as listed and agreed with the findings in the report. No results found.  ASSESSMENT & PLAN Phyllis Hopkins 45 y.o. female with medical history significant for antiphospholipid antibody syndrome with CVA presents for a follow up visit.  Her review of the labs, discussion with the patient her findings are most consistent with antiphospholipid antibody syndrome as well as an unrelated iron deficiency anemia secondary to GYN blood loss.    She has been started on Coumadin therapy and has tolerated it well.  Additionally her hemoglobin has improved from prior.  Her serum iron levels do appear to be improving at her last visit.  Our plan will be to have the patient continue to follow with the Coumadin clinic as well as return to our clinic in 3 months time in order to assure that she has stable iron levels.  # Positive APS Antibodies in Setting of CVA # Antiphospholipid Antibody Syndrome --Patient currently meets clinical/laboratory criteria and the diagnosis of APS was confirmed with repeat testing of antibodies in 12 weeks time. --after CVA with APS it is recommended that patients be on  therapy with coumadin. Other DOACs are inferior to coumadin in the setting of APS --continue with coumadin clinic through her primary care. Unfortunately we do no offer coumadin clinic services at the [facility]. --RTC in 6 months to assure patient is stable on coumadin and to assess her iron deficiency anemia.   #Iron Deficiency Anemia 2/2 to Blood Loss --reports heavy menstrual cycles as cause of blood loss/iron deficiency --repeat iron panel for the patient today --continue ferrous sulfate 325mg  PO daily with a source of vitamin C --if unable to maintain Hgb with PO therapy will consider IV iron therapy instead.  --continue to monitor  No orders of the defined types were placed in this encounter.  All questions were answered. The patient knows to call the clinic with any problems, questions or concerns.  A total of more than 30 minutes were spent on this encounter and over half of that time was spent on counseling and coordination of care as outlined above.   Ledell Peoples, MD Department of Hematology/Oncology [place] at [facility] Phone: [phone] Pager: [phone] Email: [email or web address]  07/17/2020 11:38 AM

## 2020-07-18 ENCOUNTER — Other Ambulatory Visit: Payer: Federal, State, Local not specified - PPO

## 2020-07-18 ENCOUNTER — Ambulatory Visit: Payer: Federal, State, Local not specified - PPO | Admitting: Hematology and Oncology

## 2020-07-28 ENCOUNTER — Encounter: Payer: Self-pay | Admitting: Family

## 2020-07-28 DIAGNOSIS — D6861 Antiphospholipid syndrome: Secondary | ICD-10-CM

## 2020-07-28 MED ORDER — WARFARIN SODIUM 7.5 MG PO TABS: ORAL_TABLET | ORAL | 0 refills | Status: DC

## 2020-07-28 MED ORDER — WARFARIN SODIUM 5 MG PO TABS: ORAL_TABLET | ORAL | 0 refills | Status: DC

## 2020-07-29 ENCOUNTER — Other Ambulatory Visit: Payer: Self-pay | Admitting: Family

## 2020-08-12 ENCOUNTER — Encounter: Payer: Self-pay | Admitting: Family

## 2020-08-18 ENCOUNTER — Other Ambulatory Visit: Payer: Self-pay

## 2020-08-18 ENCOUNTER — Ambulatory Visit (INDEPENDENT_AMBULATORY_CARE_PROVIDER_SITE_OTHER): Payer: Federal, State, Local not specified - PPO | Admitting: *Deleted

## 2020-08-18 DIAGNOSIS — Z7901 Long term (current) use of anticoagulants: Secondary | ICD-10-CM | POA: Diagnosis not present

## 2020-08-18 LAB — POCT INR: INR: 2.4 (ref 2.0–3.0)

## 2020-08-18 NOTE — Progress Notes (Signed)
Pt here for INR check perMelissa Inda Castle (DOD- Dr.Copland)  Goal INR =2.0-3.0  Last INR =2.5  Pt currently takes Coumadin7.5 mg daily, 5 mg Wednesday.  Pt denies recent antibiotics, no dietary changes and no unusual bruising / bleeding.  No missed doses.  INR today =2.4  Per Melissa continue same regimen and recheck in one month.  Patient will call back to schedule appointment for one month once she gets her work schedule.

## 2020-10-12 ENCOUNTER — Other Ambulatory Visit: Payer: Self-pay | Admitting: Medical

## 2020-11-28 ENCOUNTER — Other Ambulatory Visit: Payer: Self-pay | Admitting: Family

## 2020-11-28 ENCOUNTER — Telehealth: Payer: Self-pay | Admitting: Family

## 2020-11-28 DIAGNOSIS — D6861 Antiphospholipid syndrome: Secondary | ICD-10-CM

## 2020-11-28 MED ORDER — WARFARIN SODIUM 7.5 MG PO TABS
ORAL_TABLET | ORAL | 0 refills | Status: DC
Start: 2020-11-28 — End: 2020-12-04

## 2020-11-28 MED ORDER — WARFARIN SODIUM 5 MG PO TABS: ORAL_TABLET | ORAL | 0 refills | Status: DC

## 2020-11-28 NOTE — Telephone Encounter (Signed)
See mychart.  

## 2020-11-28 NOTE — Telephone Encounter (Signed)
Received refill request for Coumadin, last INR 07/2020- please advise.

## 2020-12-01 ENCOUNTER — Encounter: Payer: Self-pay | Admitting: Family

## 2020-12-04 ENCOUNTER — Other Ambulatory Visit: Payer: Self-pay

## 2020-12-04 ENCOUNTER — Ambulatory Visit (INDEPENDENT_AMBULATORY_CARE_PROVIDER_SITE_OTHER): Payer: Federal, State, Local not specified - PPO

## 2020-12-04 DIAGNOSIS — Z7901 Long term (current) use of anticoagulants: Secondary | ICD-10-CM | POA: Diagnosis not present

## 2020-12-04 DIAGNOSIS — D6861 Antiphospholipid syndrome: Secondary | ICD-10-CM

## 2020-12-04 LAB — POCT INR: INR: 1.8 — AB (ref 2.0–3.0)

## 2020-12-04 MED ORDER — WARFARIN SODIUM 5 MG PO TABS: ORAL_TABLET | ORAL | 5 refills | Status: DC

## 2020-12-04 MED ORDER — WARFARIN SODIUM 7.5 MG PO TABS: ORAL_TABLET | ORAL | 5 refills | Status: DC

## 2020-12-04 NOTE — Patient Instructions (Signed)
Continue same dose and repeat INR in 2-3 weeks on a Tuesday.   Appointment scheduled for 12-23-2020

## 2020-12-04 NOTE — Progress Notes (Signed)
Pt here for INR check per  Goal INR =2.0 to 3.0  Last INR =2.4 (on 08-18-2020)  Pt currently takes Coumadin  7.5 mg daily except 5 mg Wednesday  Pt denies recent antibiotics, no dietary changes and no unusual bruising / bleeding.  INR today = 1.8  Pt advised per Dr. Charlett Blake to continue same dose and repeat INR in 2-3 weeks on a Tuesday.   Appointment scheduled for 12-23-2020

## 2020-12-23 ENCOUNTER — Ambulatory Visit: Payer: Federal, State, Local not specified - PPO

## 2020-12-25 ENCOUNTER — Other Ambulatory Visit: Payer: Self-pay

## 2020-12-25 ENCOUNTER — Ambulatory Visit (INDEPENDENT_AMBULATORY_CARE_PROVIDER_SITE_OTHER): Payer: Federal, State, Local not specified - PPO | Admitting: *Deleted

## 2020-12-25 DIAGNOSIS — Z7901 Long term (current) use of anticoagulants: Secondary | ICD-10-CM | POA: Diagnosis not present

## 2020-12-25 LAB — POCT INR: INR: 2.6 (ref 2.0–3.0)

## 2020-12-25 NOTE — Progress Notes (Signed)
Pt here for INR check per PCP.   Goal INR =2.0 to 3.0   Last INR =1.8 on 12/04/20   Pt currently takes Coumadin  7.5 mg daily except 5 mg Wednesday   Pt denies recent antibiotics, no dietary changes and no unusual bruising / bleeding.   INR today = 2.6  Per Jodi Mourning, NP continue same regimen and recheck in 4 weeks.  Patient will call to make next nurse visit for INR.

## 2021-01-07 ENCOUNTER — Other Ambulatory Visit: Payer: Self-pay

## 2021-01-07 ENCOUNTER — Inpatient Hospital Stay: Payer: Federal, State, Local not specified - PPO

## 2021-01-07 ENCOUNTER — Inpatient Hospital Stay
Payer: Federal, State, Local not specified - PPO | Attending: Hematology and Oncology | Admitting: Hematology and Oncology

## 2021-01-07 ENCOUNTER — Other Ambulatory Visit: Payer: Self-pay | Admitting: Hematology and Oncology

## 2021-01-07 VITALS — BP 181/121 | HR 65 | Temp 97.8°F | Resp 18 | Ht 62.0 in | Wt 161.3 lb

## 2021-01-07 DIAGNOSIS — D6861 Antiphospholipid syndrome: Secondary | ICD-10-CM | POA: Diagnosis not present

## 2021-01-07 DIAGNOSIS — R760 Raised antibody titer: Secondary | ICD-10-CM

## 2021-01-07 DIAGNOSIS — D500 Iron deficiency anemia secondary to blood loss (chronic): Secondary | ICD-10-CM

## 2021-01-07 DIAGNOSIS — Z7901 Long term (current) use of anticoagulants: Secondary | ICD-10-CM | POA: Diagnosis not present

## 2021-01-07 DIAGNOSIS — Z79899 Other long term (current) drug therapy: Secondary | ICD-10-CM | POA: Insufficient documentation

## 2021-01-07 DIAGNOSIS — D509 Iron deficiency anemia, unspecified: Secondary | ICD-10-CM | POA: Diagnosis not present

## 2021-01-07 DIAGNOSIS — Z8673 Personal history of transient ischemic attack (TIA), and cerebral infarction without residual deficits: Secondary | ICD-10-CM | POA: Diagnosis not present

## 2021-01-07 LAB — CBC WITH DIFFERENTIAL (CANCER CENTER ONLY)
Abs Immature Granulocytes: 0 10*3/uL (ref 0.00–0.07)
Basophils Absolute: 0 10*3/uL (ref 0.0–0.1)
Basophils Relative: 0 %
Eosinophils Absolute: 0 10*3/uL (ref 0.0–0.5)
Eosinophils Relative: 1 %
HCT: 36.3 % (ref 36.0–46.0)
Hemoglobin: 12 g/dL (ref 12.0–15.0)
Immature Granulocytes: 0 %
Lymphocytes Relative: 37 %
Lymphs Abs: 1.7 10*3/uL (ref 0.7–4.0)
MCH: 29.6 pg (ref 26.0–34.0)
MCHC: 33.1 g/dL (ref 30.0–36.0)
MCV: 89.6 fL (ref 80.0–100.0)
Monocytes Absolute: 0.4 10*3/uL (ref 0.1–1.0)
Monocytes Relative: 8 %
Neutro Abs: 2.5 10*3/uL (ref 1.7–7.7)
Neutrophils Relative %: 54 %
Platelet Count: 525 10*3/uL — ABNORMAL HIGH (ref 150–400)
RBC: 4.05 MIL/uL (ref 3.87–5.11)
RDW: 14.3 % (ref 11.5–15.5)
WBC Count: 4.6 10*3/uL (ref 4.0–10.5)
nRBC: 0 % (ref 0.0–0.2)

## 2021-01-07 LAB — CMP (CANCER CENTER ONLY)
ALT: 12 U/L (ref 0–44)
AST: 16 U/L (ref 15–41)
Albumin: 4.2 g/dL (ref 3.5–5.0)
Alkaline Phosphatase: 42 U/L (ref 38–126)
Anion gap: 10 (ref 5–15)
BUN: 13 mg/dL (ref 6–20)
CO2: 28 mmol/L (ref 22–32)
Calcium: 9.4 mg/dL (ref 8.9–10.3)
Chloride: 103 mmol/L (ref 98–111)
Creatinine: 0.79 mg/dL (ref 0.44–1.00)
GFR, Estimated: >60 mL/min (ref >60)
Glucose, Bld: 128 mg/dL — ABNORMAL HIGH (ref 70–99)
Potassium: 3.8 mmol/L (ref 3.5–5.1)
Sodium: 141 mmol/L (ref 135–145)
Total Bilirubin: 0.8 mg/dL (ref 0.3–1.2)
Total Protein: 7.9 g/dL (ref 6.5–8.1)

## 2021-01-07 LAB — IRON AND TIBC
Iron: 75 ug/dL (ref 41–142)
Saturation Ratios: 27 % (ref 21–57)
TIBC: 274 ug/dL (ref 236–444)
UIBC: 199 ug/dL (ref 120–384)

## 2021-01-07 LAB — RETIC PANEL
Immature Retic Fract: 7.3 % (ref 2.3–15.9)
RBC.: 3.99 MIL/uL (ref 3.87–5.11)
Retic Count, Absolute: 39.5 10*3/uL (ref 19.0–186.0)
Retic Ct Pct: 1 % (ref 0.4–3.1)
Reticulocyte Hemoglobin: 34.1 pg (ref >27.9)

## 2021-01-07 LAB — FERRITIN: Ferritin: 45 ng/mL (ref 11–307)

## 2021-01-07 NOTE — Progress Notes (Signed)
[place] [phone]   Fax:([phone]  PROGRESS NOTE  Patient Care Team: Debbrah Alar, NP as PCP - General (Internal Medicine)  Hematological/Oncological History # Positive APS Antibodies in Setting of CVA 1) 06/20/2019: Left MCA stroke. Started on aspirin therapy 81mg  daily.   2) 06/21/2019: Beta-2-glycoprotein IgG 65, Anticardiolipin IgG 66 3) 10/04/2019: as part of neurology workup, patient underwent APS testing. Found to have Anticardiolipin IgG elevated at 79, Beta 2 Glycoprotein IgG elevated at 88.  4) 11/14/2019: establish care with Dr. Lorenso Courier    Interval History:  Phyllis Hopkins 45 y.o. female with medical history significant for antiphospholipid antibody syndrome with CVA presents for a follow up visit. The patient's last visit was on 07/17/2020. In the interim since the last visit the patient has been continued on systemic anticoagulation with coumadin therapy.   On exam today Phyllis Hopkins notes she is tolerating Coumadin therapy well.  She notes that she has some "bruising here and there but nothing significant".  She notes that her menstrual cycles are a little bit heavier but nothing too severe.  She reports no lightheadedness or dizziness.  She notes also that she is not having any residual stroke symptoms.  Her INR has been steady at target range with her last one measuring at 2.3.  She denies having any issues with fevers, chills, sweats, nausea, vomiting or diarrhea.  She notes that she is tolerating the iron pills well with no constipation or stomach upset.  She has no other questions or concerns today.  A full 10 point ROS is listed below  MEDICAL HISTORY:  Past Medical History:  Diagnosis Date   Blood in stool    hx of   Heart murmur    Hypertension     SURGICAL HISTORY: Past Surgical History:  Procedure Laterality Date   BUBBLE STUDY  06/25/2019   Procedure: BUBBLE STUDY;  Surgeon: Donato Heinz, MD;   Location: [facility] ENDOSCOPY;  Service: Endoscopy;;   TEE WITHOUT CARDIOVERSION N/A 06/25/2019   Procedure: TRANSESOPHAGEAL ECHOCARDIOGRAM (TEE);  Surgeon: Donato Heinz, MD;  Location: [facility] ENDOSCOPY;  Service: Endoscopy;  Laterality: N/A;    SOCIAL HISTORY: Social History   Socioeconomic History   Marital status: Divorced    Spouse name: Not on file   Number of children: Not on file   Years of education: Not on file   Highest education level: Not on file  Occupational History   Occupation: respiratory therapistory therapist    Comment: [employer]  Tobacco Use   Smoking status: Never   Smokeless tobacco: Never  Substance and Sexual Activity   Alcohol use: No   Drug use: No   Sexual activity: Not on file  Other Topics Concern   Not on file  Social History Narrative   Divorced lives w/ cousin   Regular exercise; yes   Social Determinants of Health   Financial Resource Strain: Not on file  Food Insecurity: Not on file  Transportation Needs: Not on file  Physical Activity: Not on file  Stress: Not on file  Social Connections: Not on file  Intimate Partner Violence: Not on file    FAMILY HISTORY: Family History  Problem Relation Age of Onset   Hypertension Mother    Hypertension Father    Diabetes Father        diet controlled    ALLERGIES:  has No Known Allergies.  MEDICATIONS:  Current Outpatient Medications  Medication Sig Dispense Refill   amLODipine (  NORVASC) 5 MG tablet TAKE 1 TABLET(5 MG) BY MOUTH DAILY 90 tablet 2   aspirin EC 81 MG EC tablet Take 1 tablet (81 mg total) by mouth daily.     atorvastatin (LIPITOR) 40 MG tablet Take 1 tablet (40 mg total) by mouth daily at 6 PM. 30 tablet 2   enoxaparin (LOVENOX) 80 MG/0.8ML injection Inject 0.75 mLs (75 mg total) into the skin every 12 (twelve) hours. 16 mL 0   ferrous sulfate 325 (65 FE) MG EC tablet Take 1 tablet (325 mg total) by mouth daily with breakfast. 30 tablet 3   labetalol (NORMODYNE) 200 MG  tablet TAKE 1 TABLET(200 MG) BY MOUTH TWICE DAILY 180 tablet 0   warfarin (COUMADIN) 5 MG tablet Take as directed by Coumadin Clinic 4 tablet 5   warfarin (COUMADIN) 7.5 MG tablet Take as directed by Coumadin Clinic 24 tablet 5   No current facility-administered medications for this visit.    REVIEW OF SYSTEMS:   Constitutional: ( - ) fevers, ( - )  chills , ( - ) night sweats Eyes: ( - ) blurriness of vision, ( - ) double vision, ( - ) watery eyes Ears, nose, mouth, throat, and face: ( - ) mucositis, ( - ) sore throat Respiratory: ( - ) cough, ( - ) dyspnea, ( - ) wheezes Cardiovascular: ( - ) palpitation, ( - ) chest discomfort, ( - ) lower extremity swelling Gastrointestinal:  ( - ) nausea, ( - ) heartburn, ( - ) change in bowel habits Skin: ( - ) abnormal skin rashes Lymphatics: ( - ) new lymphadenopathy, ( - ) easy bruising Neurological: ( - ) numbness, ( - ) tingling, ( - ) new weaknesses Behavioral/Psych: ( - ) mood change, ( - ) new changes  All other systems were reviewed with the patient and are negative.  PHYSICAL EXAMINATION: ECOG PERFORMANCE STATUS: 0 - Asymptomatic  Vitals:   01/07/21 1137 01/07/21 1140  BP: (!) 163/103 (!) 181/121  Pulse: 65   Resp: 18   Temp: 97.8 F (36.6 C)   SpO2: 100%     Filed Weights   01/07/21 1137  Weight: 161 lb 4.8 oz (73.2 kg)     GENERAL: well appearing middle aged [place] American female, alert, no distress and comfortable SKIN: skin color, texture, turgor are normal, no rashes or significant lesions EYES: conjunctiva are pink and non-injected, sclera clear LUNGS: clear to auscultation and percussion with normal breathing effort HEART: regular rate & rhythm and no murmurs and no lower extremity edema Musculoskeletal: no cyanosis of digits and no clubbing  PSYCH: alert & oriented x 3, fluent speech NEURO: no focal motor/sensory deficits  LABORATORY DATA:  I have reviewed the data as listed CBC Latest Ref Rng & Units  01/07/2021 07/17/2020 04/16/2020  WBC 4.0 - 10.5 K/uL 4.6 5.2 4.6  Hemoglobin 12.0 - 15.0 g/dL 12.0 12.5 12.8  Hematocrit 36.0 - 46.0 % 36.3 38.4 38.6  Platelets 150 - 400 K/uL 525(H) 483(H) 478(H)    CMP Latest Ref Rng & Units 01/07/2021 07/17/2020 04/16/2020  Glucose 70 - 99 mg/dL 128(H) 95 126(H)  BUN 6 - 20 mg/dL 13 10 11   Creatinine 0.44 - 1.00 mg/dL 0.79 0.89 0.87  Sodium 135 - 145 mmol/L 141 138 138  Potassium 3.5 - 5.1 mmol/L 3.8 3.7 3.6  Chloride 98 - 111 mmol/L 103 107 105  CO2 22 - 32 mmol/L 28 24 26   Calcium 8.9 - 10.3 mg/dL 9.4 9.0  9.2  Total Protein 6.5 - 8.1 g/dL 7.9 7.6 7.5  Total Bilirubin 0.3 - 1.2 mg/dL 0.8 0.5 0.4  Alkaline Phos 38 - 126 U/L 42 50 50  AST 15 - 41 U/L 16 13(L) 13(L)  ALT 0 - 44 U/L 12 7 11     RADIOGRAPHIC STUDIES: I have personally reviewed the radiological images as listed and agreed with the findings in the report. No results found.  ASSESSMENT & PLAN Talya Quain 45 y.o. female with medical history significant for antiphospholipid antibody syndrome with CVA presents for a follow up visit.  Her review of the labs, discussion with the patient her findings are most consistent with antiphospholipid antibody syndrome as well as an unrelated iron deficiency anemia secondary to GYN blood loss.    She has continued on Coumadin therapy and has tolerated it well.  Additionally her hemoglobin has improved from prior.  Her serum iron levels do appear to be improving at her last visit.  Our plan will be to have the patient continue to follow with the Coumadin clinic as well as return to our clinic in 6 months time in order to assure that she has stable iron levels.  # Positive APS Antibodies in Setting of CVA # Antiphospholipid Antibody Syndrome --Patient currently meets clinical/laboratory criteria and the diagnosis of APS was confirmed with repeat testing of antibodies in 12 weeks time. --after CVA with APS it is recommended that patients be on  therapy with coumadin. Other DOACs are inferior to coumadin in the setting of APS --continue with coumadin clinic through her primary care. Unfortunately we do no offer coumadin clinic services at the [facility]. --RTC in 6 months to assure patient is stable on coumadin and to assess her iron deficiency anemia.    #Iron Deficiency Anemia 2/2 to Blood Loss --previously reportedheavy menstrual cycles as cause of blood loss/iron deficiency --repeat iron panel for the patient today --Hgb stable at 12.0.  --continue ferrous sulfate 325mg  PO daily with a source of vitamin C --if unable to maintain Hgb with PO therapy will consider IV iron therapy instead.  --continue to monitor  No orders of the defined types were placed in this encounter.  All questions were answered. The patient knows to call the clinic with any problems, questions or concerns.  A total of more than 30 minutes were spent on this encounter and over half of that time was spent on counseling and coordination of care as outlined above.   Phyllis Peoples, MD Department of Hematology/Oncology [place] at [facility] Phone: [phone] Pager: [phone] Email: [email or web address]  01/19/2021 8:12 AM

## 2021-01-10 ENCOUNTER — Other Ambulatory Visit: Payer: Self-pay | Admitting: Medical

## 2021-01-19 ENCOUNTER — Telehealth: Payer: Self-pay | Admitting: Hematology and Oncology

## 2021-01-19 ENCOUNTER — Encounter: Payer: Self-pay | Admitting: Hematology and Oncology

## 2021-01-19 NOTE — Telephone Encounter (Signed)
Scheduled appts per 8/1 sch msg. Called pt, no answer. Left msg with appts date and times.

## 2021-01-21 ENCOUNTER — Other Ambulatory Visit: Payer: Self-pay

## 2021-01-21 ENCOUNTER — Ambulatory Visit (INDEPENDENT_AMBULATORY_CARE_PROVIDER_SITE_OTHER): Payer: Federal, State, Local not specified - PPO | Admitting: *Deleted

## 2021-01-21 DIAGNOSIS — Z7901 Long term (current) use of anticoagulants: Secondary | ICD-10-CM

## 2021-01-21 LAB — POCT INR: INR: 1.5 — AB (ref 2.0–3.0)

## 2021-01-21 NOTE — Progress Notes (Signed)
Pt here for INR check per PCP.   Goal INR =2.0 to 3.0   Last INR =2.6 (12/25/20)   Pt currently takes Coumadin  7.5 mg daily except 5 mg Wednesday   Pt denies recent antibiotics, no dietary changes and no unusual bruising / bleeding.  Took medication today at 11am.   INR today = 1.5   Patient advised per Melissa to take 7.5 daily and repeat in 2 weeks.  Patient did not have work schedule at this time and will call back to schedule.

## 2021-02-28 IMAGING — US US OB TRANSVAGINAL
1 series · 14 of 28 positions shown · non-contrast
Comparison: None.

CLINICAL DATA: Vaginal bleeding in 1st trimester pregnancy.

EXAM:
OBSTETRIC <14 WK US AND TRANSVAGINAL OB US
TECHNIQUE: Both transabdominal and transvaginal ultrasound examinations were
performed for complete evaluation of the gestation as well as the
maternal uterus, adnexal regions, and pelvic cul-de-sac.
Transvaginal technique was performed to assess early pregnancy.

[Series 1: us ob transvaginal · 96 acquisitions, 14 frames shown]
[im 4/96]
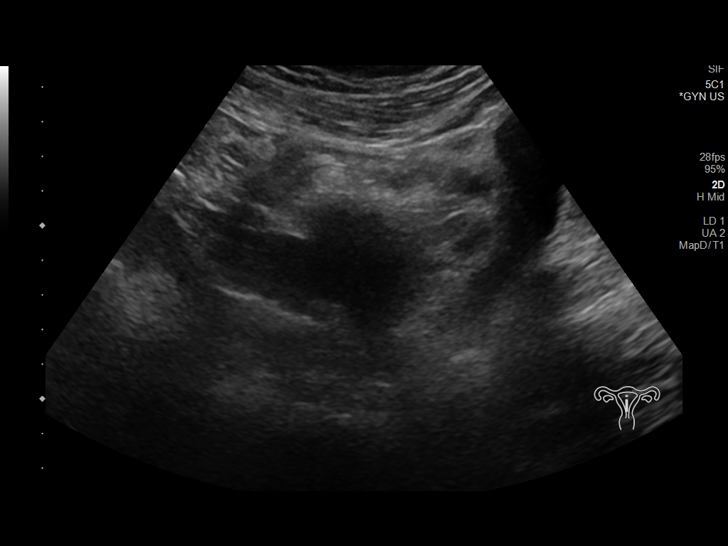
[im 11/96]
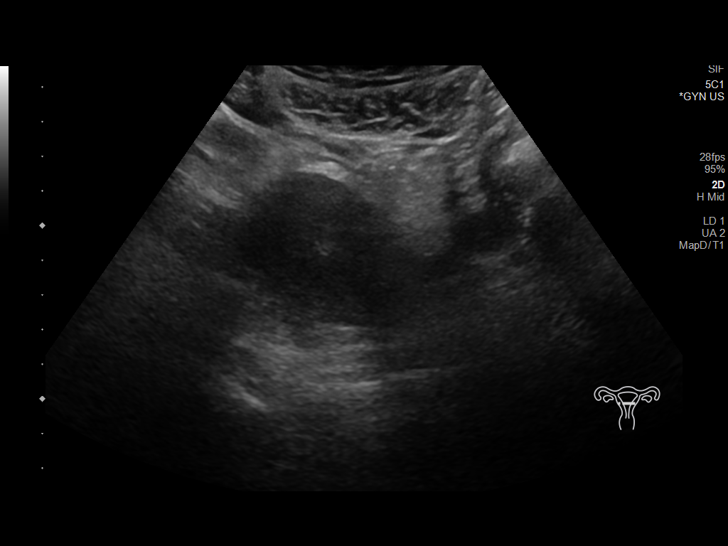
[im 18/96]
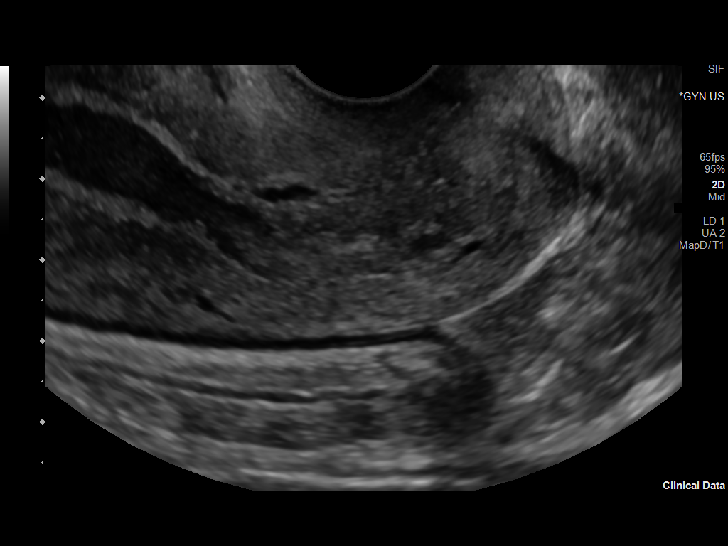
[im 25/96]
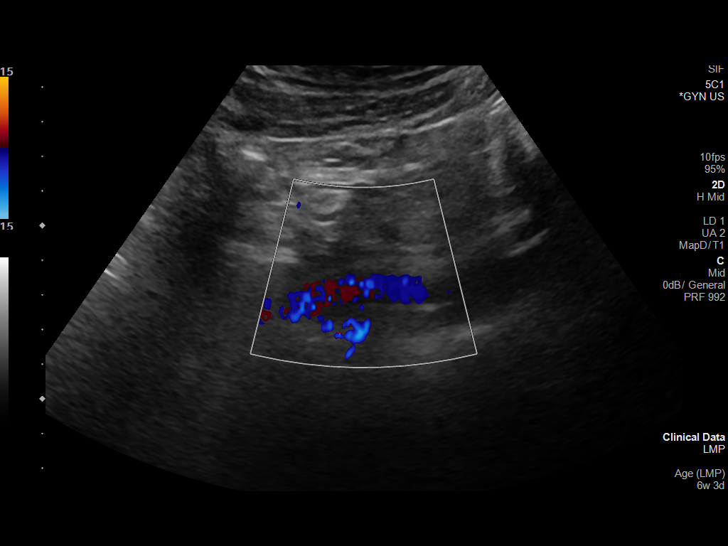
[im 32/96]
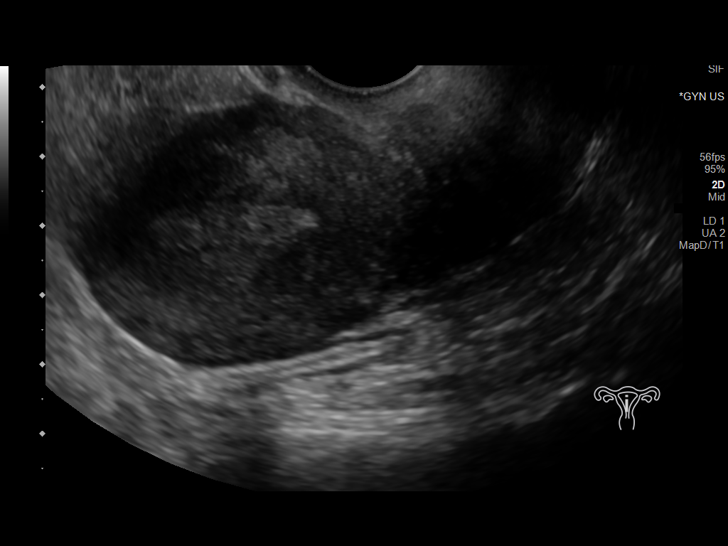
[im 39/96]
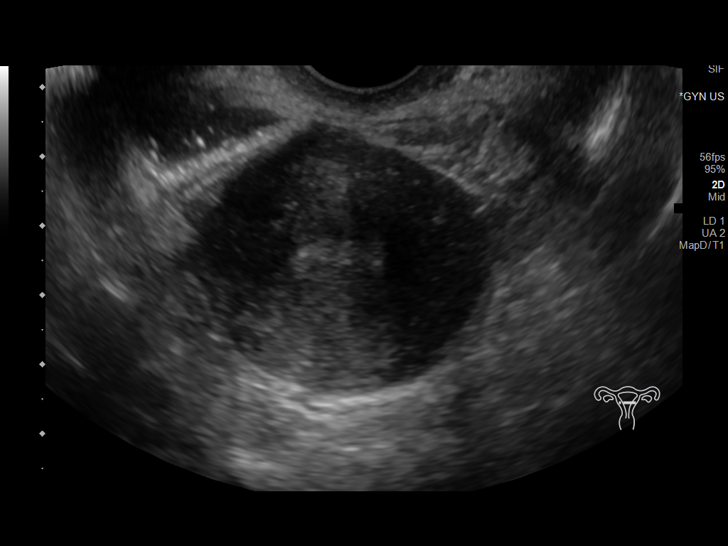
[im 46/96]
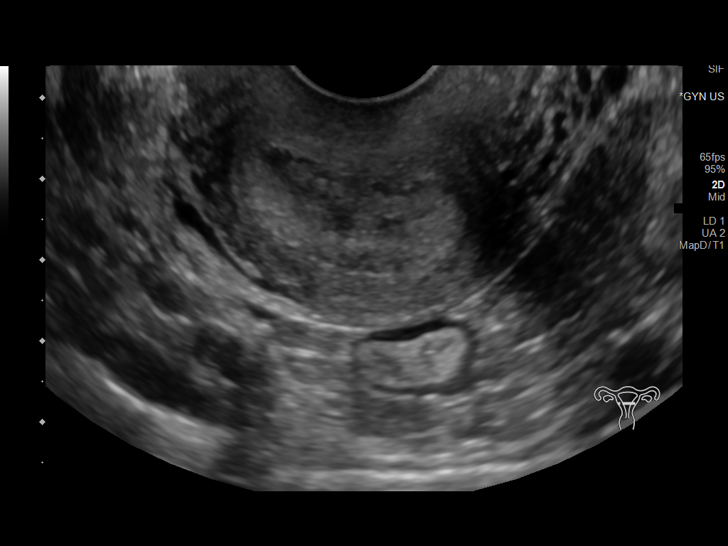
[im 53/96]
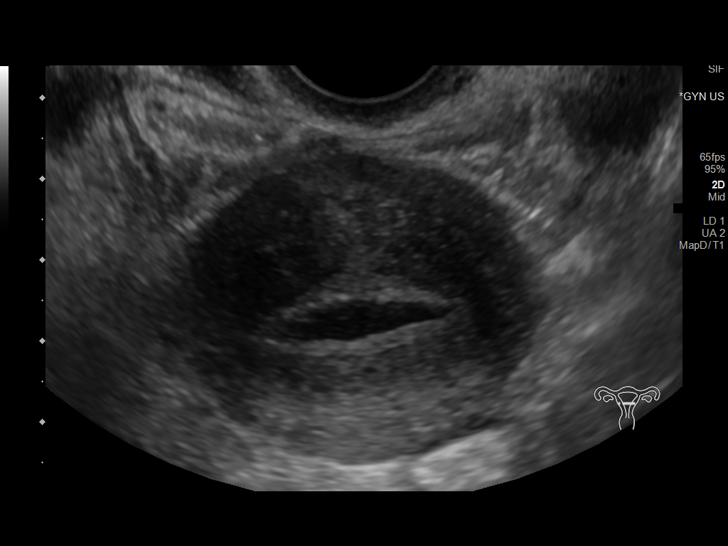
[im 60/96]
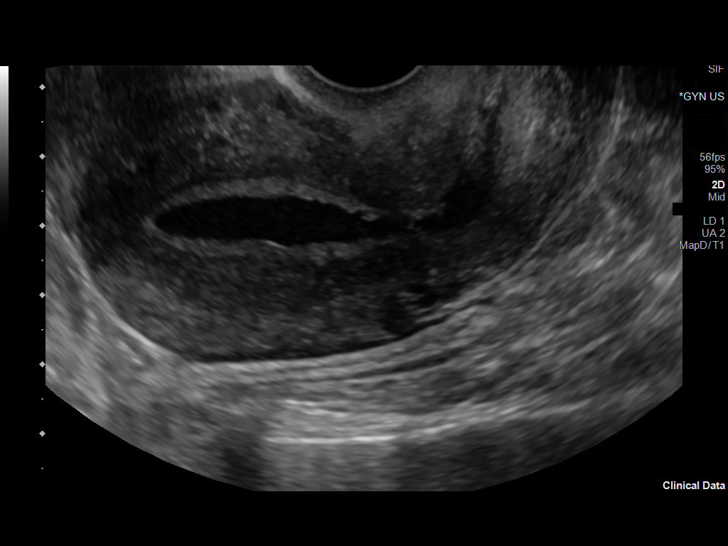
[im 67/96]
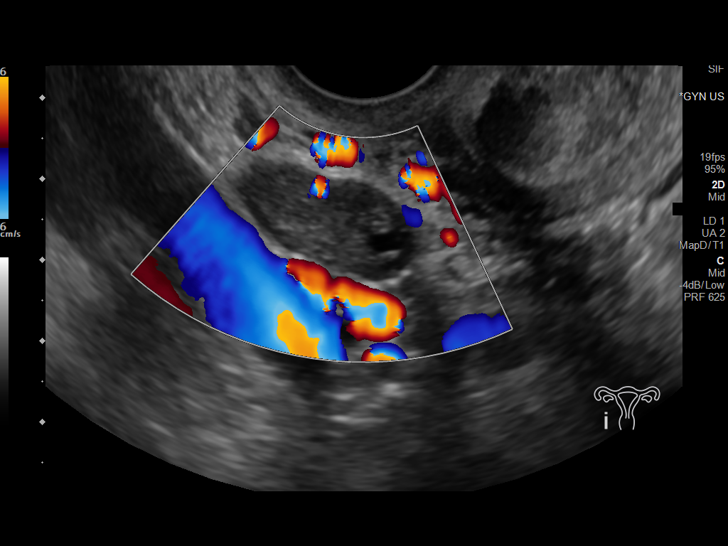
[im 74/96]
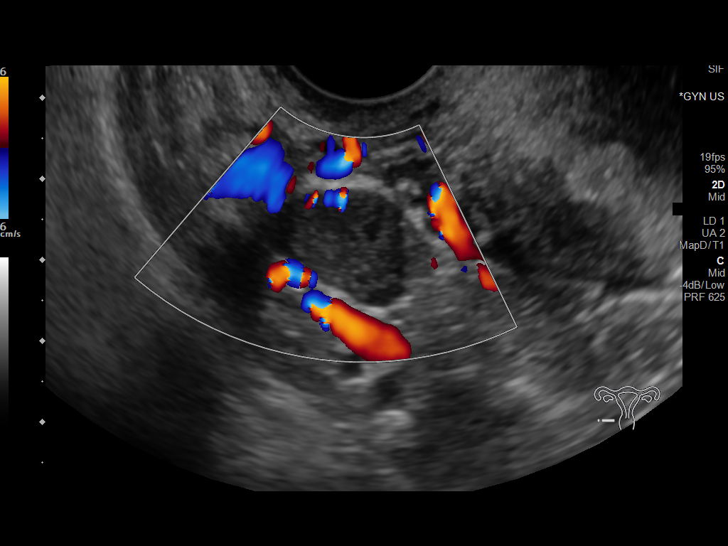
[im 81/96]
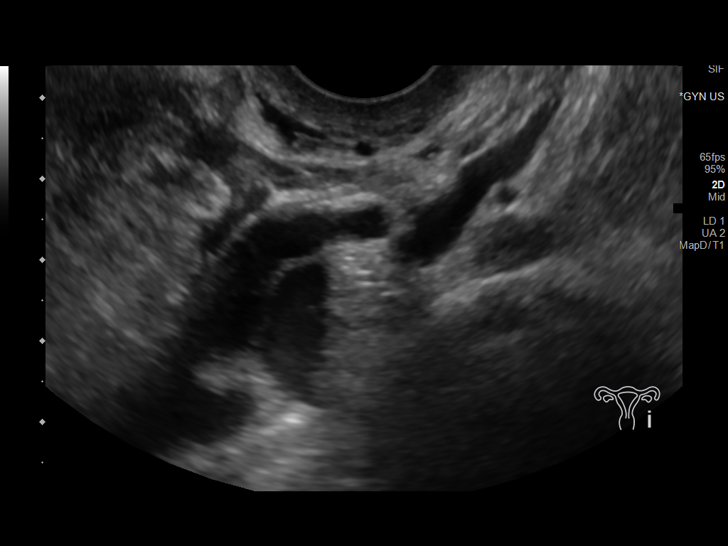
[im 88/96]
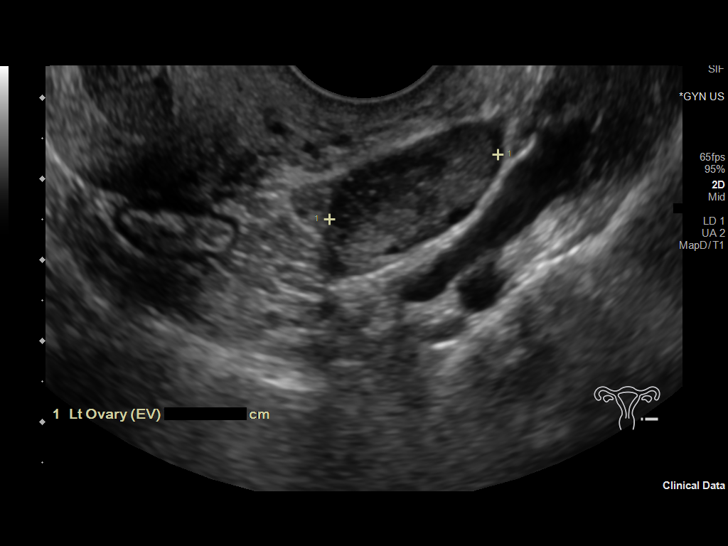
[im 96/96]
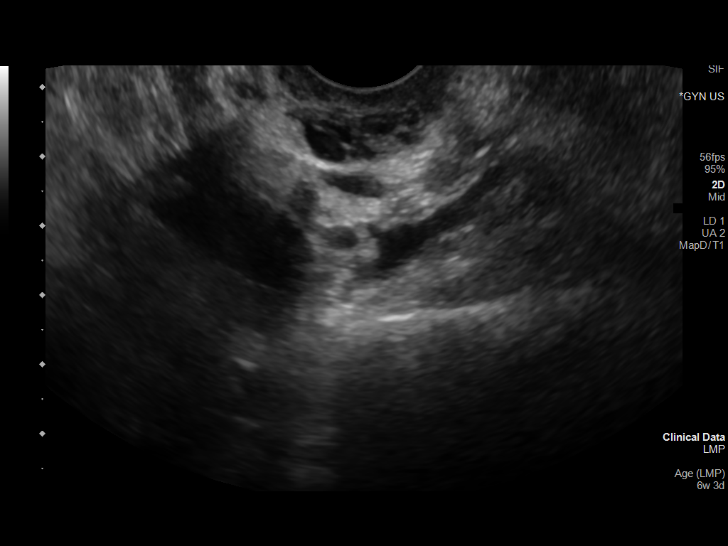

[14 of 28 positions shown; findings below may reference images not displayed]

FINDINGS: Intrauterine gestational sac: None

Maternal uterus/adnexae: Fluid or blood is seen throughout the
endometrial cavity. No fibroids identified. Both ovaries are normal
in appearance. No adnexal mass or abnormal free fluid identified.
IMPRESSION: Pregnancy of unknown anatomic location (no intrauterine gestational
sac or adnexal mass identified). Fluid or blood throughout the
endometrial cavity favors a recent spontaneous abortion, however,
IUP too early to visualize, and non-visualized ectopic pregnancy
cannot definitely be excluded. Recommend correlation with serial
beta-hCG levels, and follow up US if warranted clinically.

## 2021-03-02 IMAGING — MR MR HEAD W/O CM
9 of 10 series · 36 of 48 positions shown · non-contrast
Comparison: CT studies done yesterday.

CLINICAL DATA: Follow-up stroke with aphasia. Lytic administration.

EXAM:
MRI HEAD WITHOUT CONTRAST
TECHNIQUE: Multiplanar, multiecho pulse sequences of the brain and surrounding
structures were obtained without intravenous contrast.

[Series 3: DWI · axial · 3.0mm · 1.09mm/px · z∈[-83,+46]mm · 9 of 94 slices shown (1 of 4)]
[im 1/94]
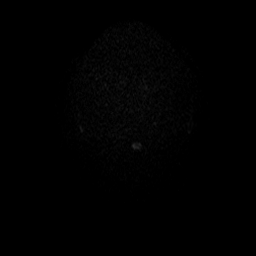
[im 12/94]
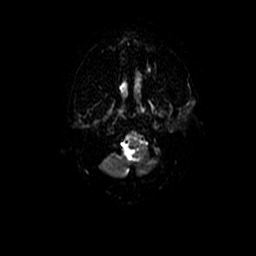
[im 24/94]
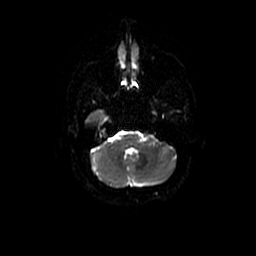
[im 35/94]
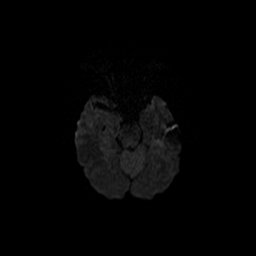
[im 47/94]
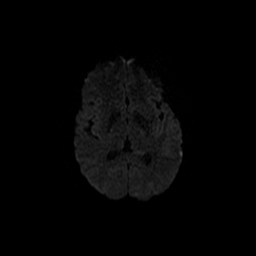
[im 59/94]
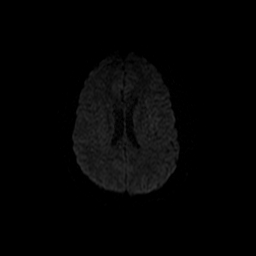
[im 70/94]
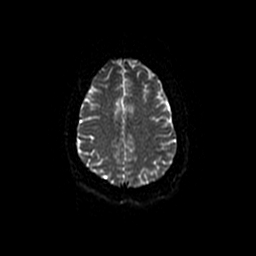
[im 82/94]
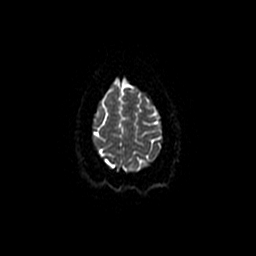
[im 94/94]
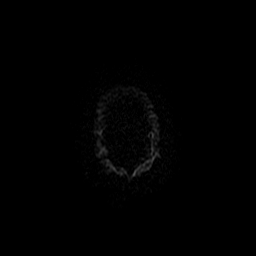

[Series 4: DWI · coronal · 5.0mm · 1.09mm/px · 7 of 66 slices shown (2 of 4)]
[im 1/66]
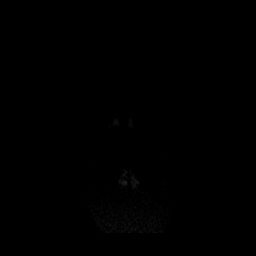
[im 11/66]
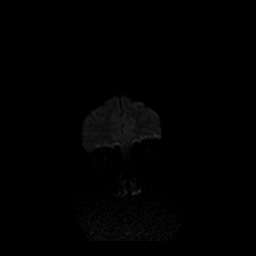
[im 22/66]
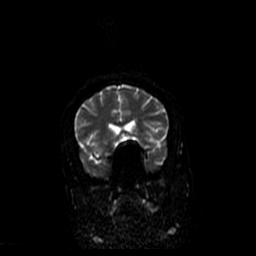
[im 33/66]
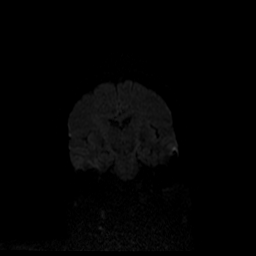
[im 44/66]
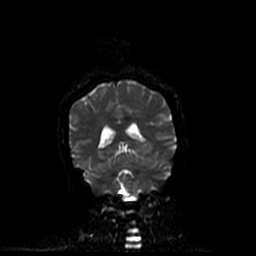
[im 55/66]
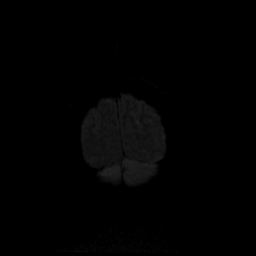
[im 66/66]
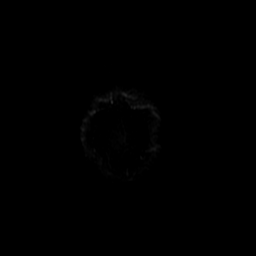

[Series 5: T1 · sagittal · 5.0mm · 0.47mm/px · 2 of 23 slices shown (1 of 2)]
[im 1/23]
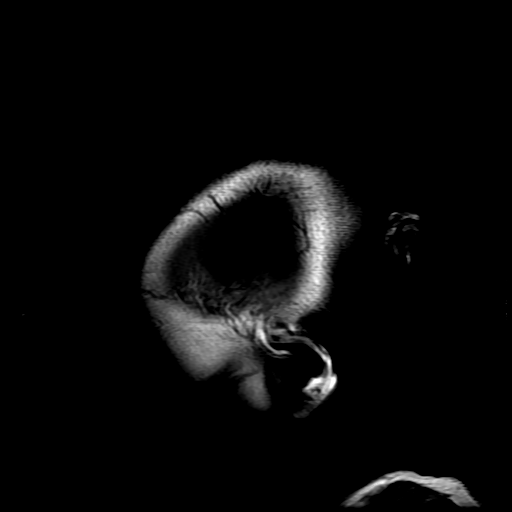
[im 23/23]
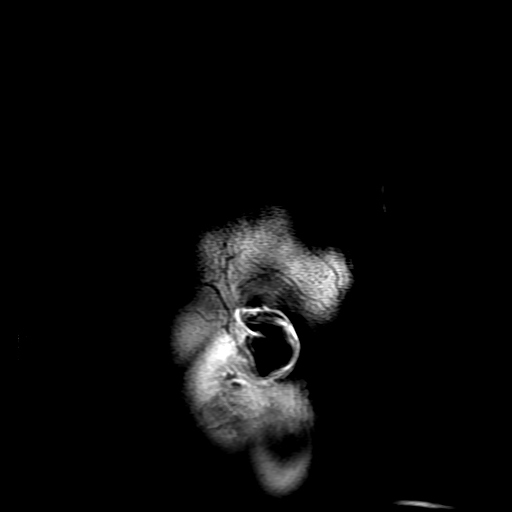

[Series 6: T2 · axial · 5.0mm · 0.43mm/px · z∈[-55,+79]mm · 3 of 24 slices shown]
[im 1/24]
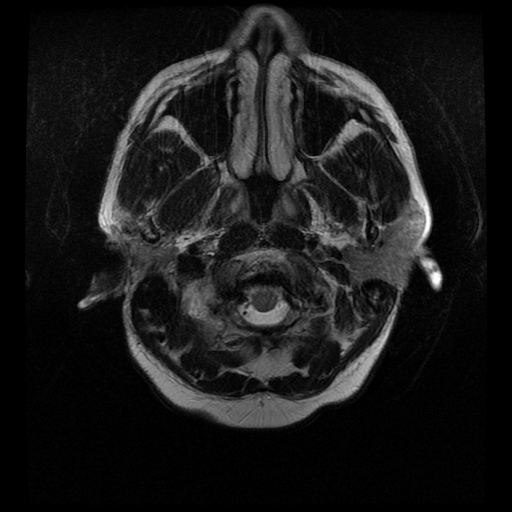
[im 12/24]
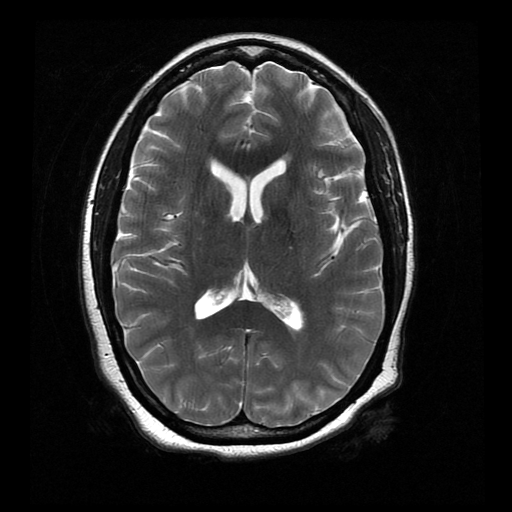
[im 24/24]
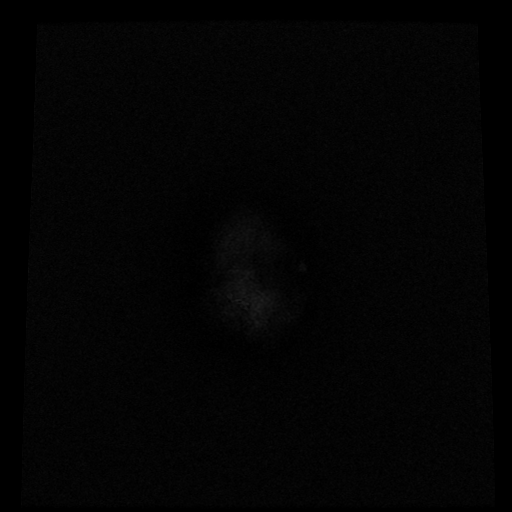

[Series 8: FLAIR · axial · 5.0mm · 0.43mm/px · z∈[-55,+79]mm · 3 of 24 slices shown]
[im 1/24]
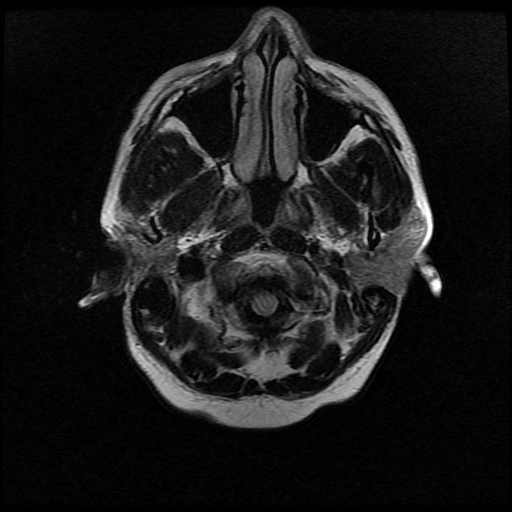
[im 12/24]
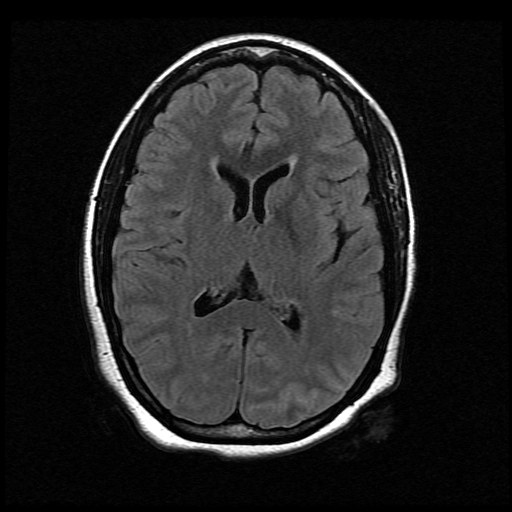
[im 24/24]
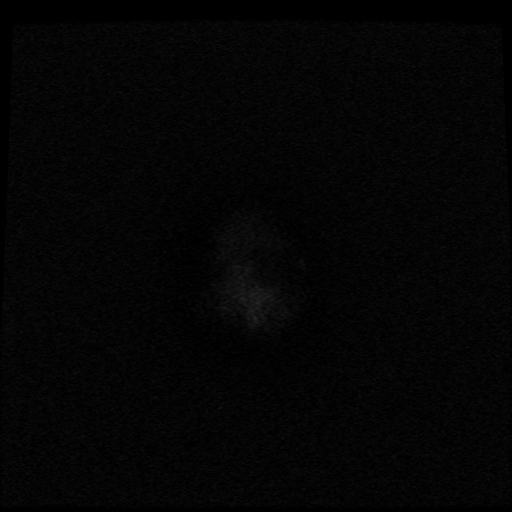

[Series 9: T1 · axial · 3.0mm · 0.47mm/px · 1 of 100 slices shown (2 of 2)]
[im 1/100]
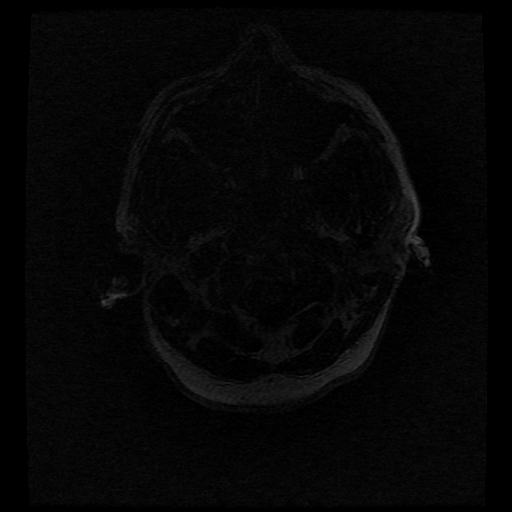

[Series 10: T2 post-contrast · coronal · 5.0mm · 0.39mm/px · 3 of 25 slices shown]
[im 1/25]
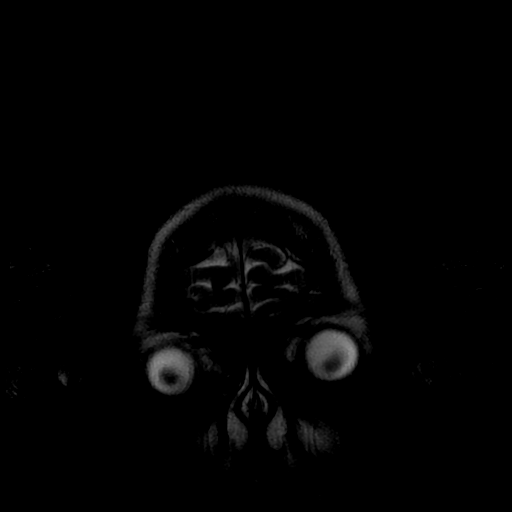
[im 13/25]
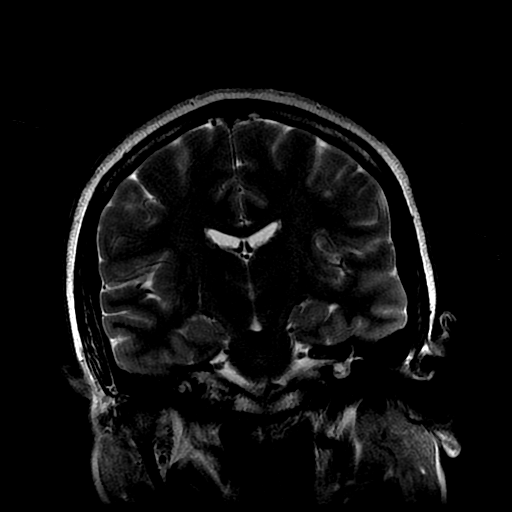
[im 25/25]
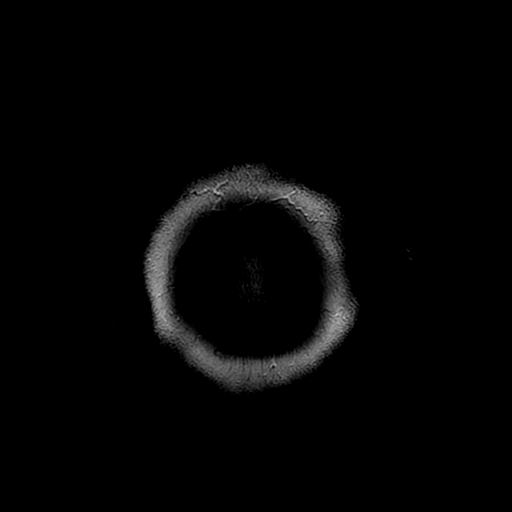

[Series 300: DWI · axial · 3.0mm · 1.09mm/px · z∈[-83,+46]mm · 5 of 47 slices shown (3 of 4)]
[im 1/47]
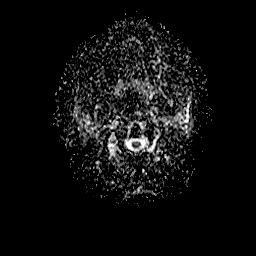
[im 12/47]
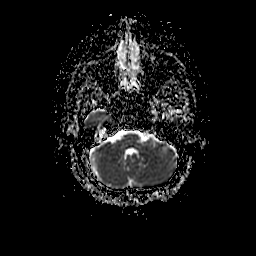
[im 24/47]
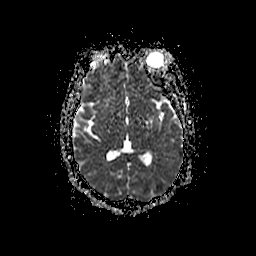
[im 35/47]
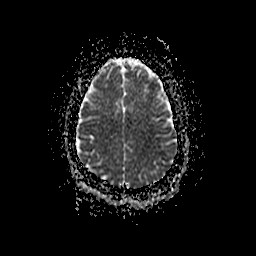
[im 47/47]
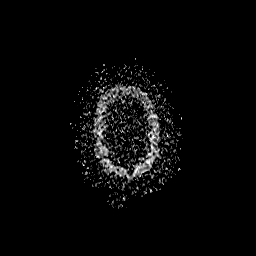

[Series 400: DWI · coronal · 5.0mm · 1.09mm/px · 3 of 33 slices shown (4 of 4)]
[im 1/33]
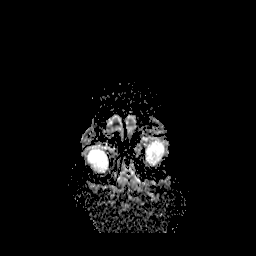
[im 17/33]
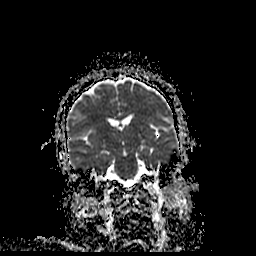
[im 33/33]
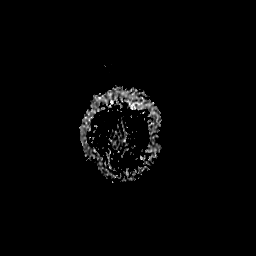

[36 of 48 positions shown; findings below may reference images not displayed]

FINDINGS: Brain: There are 4 or 5 punctate foci of acute infarctions scattered
within the left posterior parietal and occipital region consistent
with micro embolic infarctions. No large or confluent infarction.
Elsewhere, brain has normal appearance without evidence of atrophy,
old infarction, mass lesion, hemorrhage, hydrocephalus or
extra-axial collection. Single old focus of T2 and FLAIR signal
within the subcortical white matter of the left parietal lobe.

Vascular: Major vessels at the base of the brain show flow.

Skull and upper cervical spine: Negative

Sinuses/Orbits: Clear/normal

Other: None
IMPRESSION: Small grouping of small acute embolic infarctions in the left
parietal and parieto-occipital brain consistent with micro embolic
infarctions in the left MCA territory. No large or confluent
infarction. No swelling or hemorrhage.

## 2021-07-17 ENCOUNTER — Telehealth: Payer: Self-pay | Admitting: Hematology and Oncology

## 2021-07-17 NOTE — Telephone Encounter (Signed)
rescheduled per 1/27 sch msg, message has been left with pt

## 2021-07-21 ENCOUNTER — Other Ambulatory Visit: Payer: Self-pay | Admitting: Hematology and Oncology

## 2021-07-21 ENCOUNTER — Inpatient Hospital Stay (HOSPITAL_BASED_OUTPATIENT_CLINIC_OR_DEPARTMENT_OTHER): Payer: Federal, State, Local not specified - PPO | Admitting: Hematology and Oncology

## 2021-07-21 ENCOUNTER — Other Ambulatory Visit: Payer: Self-pay

## 2021-07-21 ENCOUNTER — Inpatient Hospital Stay: Payer: Federal, State, Local not specified - PPO | Attending: Hematology and Oncology

## 2021-07-21 VITALS — BP 160/100 | HR 58 | Temp 97.8°F | Resp 17 | Ht 62.0 in | Wt 165.1 lb

## 2021-07-21 DIAGNOSIS — R760 Raised antibody titer: Secondary | ICD-10-CM | POA: Diagnosis not present

## 2021-07-21 DIAGNOSIS — Z79899 Other long term (current) drug therapy: Secondary | ICD-10-CM | POA: Diagnosis not present

## 2021-07-21 DIAGNOSIS — D6861 Antiphospholipid syndrome: Secondary | ICD-10-CM

## 2021-07-21 DIAGNOSIS — D500 Iron deficiency anemia secondary to blood loss (chronic): Secondary | ICD-10-CM | POA: Insufficient documentation

## 2021-07-21 DIAGNOSIS — R768 Other specified abnormal immunological findings in serum: Secondary | ICD-10-CM | POA: Diagnosis not present

## 2021-07-21 DIAGNOSIS — N920 Excessive and frequent menstruation with regular cycle: Secondary | ICD-10-CM | POA: Insufficient documentation

## 2021-07-21 DIAGNOSIS — Z7901 Long term (current) use of anticoagulants: Secondary | ICD-10-CM | POA: Diagnosis not present

## 2021-07-21 DIAGNOSIS — Z8673 Personal history of transient ischemic attack (TIA), and cerebral infarction without residual deficits: Secondary | ICD-10-CM | POA: Diagnosis not present

## 2021-07-21 DIAGNOSIS — I10 Essential (primary) hypertension: Secondary | ICD-10-CM | POA: Insufficient documentation

## 2021-07-21 LAB — RETIC PANEL
Immature Retic Fract: 8.1 % (ref 2.3–15.9)
RBC.: 4.12 MIL/uL (ref 3.87–5.11)
Retic Count, Absolute: 41.2 10*3/uL (ref 19.0–186.0)
Retic Ct Pct: 1 % (ref 0.4–3.1)
Reticulocyte Hemoglobin: 32.9 pg (ref >27.9)

## 2021-07-21 LAB — CMP (CANCER CENTER ONLY)
ALT: 9 U/L (ref 0–44)
AST: 13 U/L — ABNORMAL LOW (ref 15–41)
Albumin: 4.1 g/dL (ref 3.5–5.0)
Alkaline Phosphatase: 42 U/L (ref 38–126)
Anion gap: 4 — ABNORMAL LOW (ref 5–15)
BUN: 10 mg/dL (ref 6–20)
CO2: 27 mmol/L (ref 22–32)
Calcium: 9.2 mg/dL (ref 8.9–10.3)
Chloride: 106 mmol/L (ref 98–111)
Creatinine: 0.87 mg/dL (ref 0.44–1.00)
GFR, Estimated: >60 mL/min (ref >60)
Glucose, Bld: 101 mg/dL — ABNORMAL HIGH (ref 70–99)
Potassium: 4.1 mmol/L (ref 3.5–5.1)
Sodium: 137 mmol/L (ref 135–145)
Total Bilirubin: 0.3 mg/dL (ref 0.3–1.2)
Total Protein: 7.5 g/dL (ref 6.5–8.1)

## 2021-07-21 LAB — CBC WITH DIFFERENTIAL (CANCER CENTER ONLY)
Abs Immature Granulocytes: 0.01 10*3/uL (ref 0.00–0.07)
Basophils Absolute: 0 10*3/uL (ref 0.0–0.1)
Basophils Relative: 1 %
Eosinophils Absolute: 0.1 10*3/uL (ref 0.0–0.5)
Eosinophils Relative: 2 %
HCT: 37.2 % (ref 36.0–46.0)
Hemoglobin: 12.4 g/dL (ref 12.0–15.0)
Immature Granulocytes: 0 %
Lymphocytes Relative: 37 %
Lymphs Abs: 1.5 10*3/uL (ref 0.7–4.0)
MCH: 30 pg (ref 26.0–34.0)
MCHC: 33.3 g/dL (ref 30.0–36.0)
MCV: 89.9 fL (ref 80.0–100.0)
Monocytes Absolute: 0.3 10*3/uL (ref 0.1–1.0)
Monocytes Relative: 8 %
Neutro Abs: 2.1 10*3/uL (ref 1.7–7.7)
Neutrophils Relative %: 52 %
Platelet Count: 476 10*3/uL — ABNORMAL HIGH (ref 150–400)
RBC: 4.14 MIL/uL (ref 3.87–5.11)
RDW: 14.3 % (ref 11.5–15.5)
WBC Count: 4.1 10*3/uL (ref 4.0–10.5)
nRBC: 0 % (ref 0.0–0.2)

## 2021-07-21 LAB — IRON AND IRON BINDING CAPACITY (CC-WL,HP ONLY)
Iron: 84 ug/dL (ref 28–170)
Saturation Ratios: 25 % (ref 10.4–31.8)
TIBC: 340 ug/dL (ref 250–450)
UIBC: 256 ug/dL (ref 148–442)

## 2021-07-21 LAB — FERRITIN: Ferritin: 23 ng/mL (ref 11–307)

## 2021-07-21 NOTE — Progress Notes (Signed)
[place] [phone]   [phone]  PROGRESS NOTE  Patient Care Team: Debbrah Alar, NP as PCP - General (Internal Medicine)  Hematological/Oncological History # Positive APS Antibodies in Setting of CVA 1) 06/20/2019: Left MCA stroke. Started on aspirin therapy 81mg  daily.   2) 06/21/2019: Beta-2-glycoprotein IgG 65, Anticardiolipin IgG 66 3) 10/04/2019: as part of neurology workup, patient underwent APS testing. Found to have Anticardiolipin IgG elevated at 79, Beta 2 Glycoprotein IgG elevated at 88.  4) 11/14/2019: establish care with Dr. Lorenso Courier    Interval History:  Phyllis Hopkins 46 y.o. female with medical history significant for antiphospholipid antibody syndrome with CVA presents for a follow up visit. The patient's last visit was on 01/07/2021. In the interim since the last visit the patient has been continued on systemic anticoagulation with coumadin therapy.   On exam today Phyllis Hopkins notes she has been well in the interim since her last visit.  She reports that she has been at therapeutic range with her Coumadin and tolerating medication well.  She has not been having any issues with bleeding, bruising, or dark stools.  She notes that this is being well managed by her nurse practitioner Melissa.  She reports that she also is having no signs or symptoms concerning for recurrent VTE or clot.  She continues to take her iron pills daily with no constipation or upset stomach.  She notes her energy levels are good and she has no other questions or concerns today.  She currently denies any fevers, chills, sweats, nausea, vomiting or diarrhea.  A full 10 point ROS is listed below  MEDICAL HISTORY:  Past Medical History:  Diagnosis Date   Blood in stool    hx of   Heart murmur    Hypertension     SURGICAL HISTORY: Past Surgical History:  Procedure Laterality Date   BUBBLE STUDY  06/25/2019   Procedure: BUBBLE STUDY;  Surgeon:  Donato Heinz, MD;  Location: [facility] ENDOSCOPY;  Service: Endoscopy;;   TEE WITHOUT CARDIOVERSION N/A 06/25/2019   Procedure: TRANSESOPHAGEAL ECHOCARDIOGRAM (TEE);  Surgeon: Donato Heinz, MD;  Location: [facility] ENDOSCOPY;  Service: Endoscopy;  Laterality: N/A;    SOCIAL HISTORY: Social History   Socioeconomic History   Marital status: Divorced    Spouse name: Not on file   Number of children: Not on file   Years of education: Not on file   Highest education level: Not on file  Occupational History   Occupation: respiratory therapistory therapist    Comment: [employer]  Tobacco Use   Smoking status: Never   Smokeless tobacco: Never  Substance and Sexual Activity   Alcohol use: No   Drug use: No   Sexual activity: Not on file  Other Topics Concern   Not on file  Social History Narrative   Divorced lives w/ cousin   Regular exercise; yes   Social Determinants of Health   Financial Resource Strain: Not on file  Food Insecurity: Not on file  Transportation Needs: Not on file  Physical Activity: Not on file  Stress: Not on file  Social Connections: Not on file  Intimate Partner Violence: Not on file    FAMILY HISTORY: Family History  Problem Relation Age of Onset   Hypertension Mother    Hypertension Father    Diabetes Father        diet controlled    ALLERGIES:  has No Known Allergies.  MEDICATIONS:  Current Outpatient Medications  Medication  Sig Dispense Refill   amLODipine (NORVASC) 5 MG tablet TAKE 1 TABLET(5 MG) BY MOUTH DAILY 90 tablet 2   aspirin EC 81 MG EC tablet Take 1 tablet (81 mg total) by mouth daily.     atorvastatin (LIPITOR) 40 MG tablet Take 1 tablet (40 mg total) by mouth daily at 6 PM. 30 tablet 2   ferrous sulfate 325 (65 FE) MG EC tablet Take 1 tablet (325 mg total) by mouth daily with breakfast. 30 tablet 3   labetalol (NORMODYNE) 200 MG tablet TAKE 1 TABLET(200 MG) BY MOUTH TWICE DAILY 180 tablet 0   warfarin (COUMADIN) 7.5 MG  tablet Take as directed by Coumadin Clinic 24 tablet 5   warfarin (COUMADIN) 5 MG tablet Take as directed by Coumadin Clinic (Patient not taking: Reported on 07/21/2021) 4 tablet 5   No current facility-administered medications for this visit.    REVIEW OF SYSTEMS:   Constitutional: ( - ) fevers, ( - )  chills , ( - ) night sweats Eyes: ( - ) blurriness of vision, ( - ) double vision, ( - ) watery eyes Ears, nose, mouth, throat, and face: ( - ) mucositis, ( - ) sore throat Respiratory: ( - ) cough, ( - ) dyspnea, ( - ) wheezes Cardiovascular: ( - ) palpitation, ( - ) chest discomfort, ( - ) lower extremity swelling Gastrointestinal:  ( - ) nausea, ( - ) heartburn, ( - ) change in bowel habits Skin: ( - ) abnormal skin rashes Lymphatics: ( - ) new lymphadenopathy, ( - ) easy bruising Neurological: ( - ) numbness, ( - ) tingling, ( - ) new weaknesses Behavioral/Psych: ( - ) mood change, ( - ) new changes  All other systems were reviewed with the patient and are negative.  PHYSICAL EXAMINATION: ECOG PERFORMANCE STATUS: 0 - Asymptomatic  Vitals:   07/21/21 1030  BP: (!) 160/100  Pulse: (!) 58  Resp: 17  Temp: 97.8 F (36.6 C)  SpO2: 99%     Filed Weights   07/21/21 1030  Weight: 165 lb 1.6 oz (74.9 kg)      GENERAL: well appearing middle aged [place] American female, alert, no distress and comfortable SKIN: skin color, texture, turgor are normal, no rashes or significant lesions EYES: conjunctiva are pink and non-injected, sclera clear LUNGS: clear to auscultation and percussion with normal breathing effort HEART: regular rate & rhythm and no murmurs and no lower extremity edema Musculoskeletal: no cyanosis of digits and no clubbing  PSYCH: alert & oriented x 3, fluent speech NEURO: no focal motor/sensory deficits  LABORATORY DATA:  I have reviewed the data as listed CBC Latest Ref Rng & Units 07/21/2021 01/07/2021 07/17/2020  WBC 4.0 - 10.5 K/uL 4.1 4.6 5.2  Hemoglobin  12.0 - 15.0 g/dL 12.4 12.0 12.5  Hematocrit 36.0 - 46.0 % 37.2 36.3 38.4  Platelets 150 - 400 K/uL 476(H) 525(H) 483(H)    CMP Latest Ref Rng & Units 07/21/2021 01/07/2021 07/17/2020  Glucose 70 - 99 mg/dL 101(H) 128(H) 95  BUN 6 - 20 mg/dL 10 13 10   Creatinine 0.44 - 1.00 mg/dL 0.87 0.79 0.89  Sodium 135 - 145 mmol/L 137 141 138  Potassium 3.5 - 5.1 mmol/L 4.1 3.8 3.7  Chloride 98 - 111 mmol/L 106 103 107  CO2 22 - 32 mmol/L 27 28 24   Calcium 8.9 - 10.3 mg/dL 9.2 9.4 9.0  Total Protein 6.5 - 8.1 g/dL 7.5 7.9 7.6  Total Bilirubin 0.3 -  1.2 mg/dL 0.3 0.8 0.5  Alkaline Phos 38 - 126 U/L 42 42 50  AST 15 - 41 U/L 13(L) 16 13(L)  ALT 0 - 44 U/L 9 12 7     RADIOGRAPHIC STUDIES: I have personally reviewed the radiological images as listed and agreed with the findings in the report. No results found.  ASSESSMENT & PLAN Phyllis Hopkins 46 y.o. female with medical history significant for antiphospholipid antibody syndrome with CVA presents for a follow up visit.  Her review of the labs, discussion with the patient her findings are most consistent with antiphospholipid antibody syndrome as well as an unrelated iron deficiency anemia secondary to GYN blood loss.    She has continued on coumadin therapy and has tolerated it well.  Additionally her hemoglobin has improved from prior.  Her serum iron levels do appear to be improving at her last visit.  Our plan will be to have the patient continue to follow with the Coumadin clinic as well as return to our clinic in 6 months time in order to assure that she has stable iron levels.  # Positive APS Antibodies in Setting of CVA # Antiphospholipid Antibody Syndrome --Patient currently meets clinical/laboratory criteria and the diagnosis of APS was confirmed with repeat testing of antibodies in 12 weeks time. --after CVA with APS it is recommended that patients be on lifelong therapy with coumadin. Other DOACs are inferior to coumadin in the setting  of APS --continue with coumadin clinic through her primary care. Unfortunately we do no offer coumadin clinic services at the [facility]. She currently follows with another coumadin clinic.  --RTC in 6 months to assure patient is stable on coumadin and to assess her iron deficiency anemia.    #Iron Deficiency Anemia 2/2 to Blood Loss --previously reportedheavy menstrual cycles as cause of blood loss/iron deficiency --repeat iron panel for the patient today --Hgb stable at 12.4, MCV 89.9, WBC 4.1, Plt 476.  --continue ferrous sulfate 325mg  PO daily with a source of vitamin C --if unable to maintain Hgb with PO therapy will consider IV iron therapy instead.  --continue to monitor  No orders of the defined types were placed in this encounter.   All questions were answered. The patient knows to call the clinic with any problems, questions or concerns.  A total of more than 30 minutes were spent on this encounter and over half of that time was spent on counseling and coordination of care as outlined above.   Ledell Peoples, MD Department of Hematology/Oncology [place] [facility] Phone: [phone] Pager: [phone] Email: [email or web address]  07/21/2021 11:08 AM

## 2021-07-22 ENCOUNTER — Ambulatory Visit: Payer: Federal, State, Local not specified - PPO | Admitting: Hematology and Oncology

## 2021-07-22 ENCOUNTER — Other Ambulatory Visit: Payer: Federal, State, Local not specified - PPO

## 2021-09-17 ENCOUNTER — Emergency Department (HOSPITAL_BASED_OUTPATIENT_CLINIC_OR_DEPARTMENT_OTHER)
Admission: EM | Admit: 2021-09-17 | Discharge: 2021-09-17 | Disposition: A | Discharge status: Home / Self Care | Payer: Federal, State, Local not specified - PPO | Attending: Emergency Medicine | Admitting: Emergency Medicine

## 2021-09-17 ENCOUNTER — Encounter (HOSPITAL_BASED_OUTPATIENT_CLINIC_OR_DEPARTMENT_OTHER): Payer: Self-pay | Admitting: Emergency Medicine

## 2021-09-17 ENCOUNTER — Other Ambulatory Visit: Payer: Self-pay

## 2021-09-17 DIAGNOSIS — Z7982 Long term (current) use of aspirin: Secondary | ICD-10-CM | POA: Insufficient documentation

## 2021-09-17 DIAGNOSIS — L089 Local infection of the skin and subcutaneous tissue, unspecified: Secondary | ICD-10-CM | POA: Insufficient documentation

## 2021-09-17 DIAGNOSIS — I10 Essential (primary) hypertension: Secondary | ICD-10-CM | POA: Insufficient documentation

## 2021-09-17 DIAGNOSIS — M7989 Other specified soft tissue disorders: Secondary | ICD-10-CM | POA: Diagnosis not present

## 2021-09-17 DIAGNOSIS — Z79899 Other long term (current) drug therapy: Secondary | ICD-10-CM | POA: Diagnosis not present

## 2021-09-17 DIAGNOSIS — Z7901 Long term (current) use of anticoagulants: Secondary | ICD-10-CM | POA: Diagnosis not present

## 2021-09-17 MED ORDER — AMOXICILLIN-POT CLAVULANATE 875-125 MG PO TABS
1.0000 | ORAL_TABLET | Freq: Once | ORAL | Status: AC
  Administered 2021-09-17: 1 via ORAL
  Filled 2021-09-17: qty 1

## 2021-09-17 MED ORDER — AMOXICILLIN-POT CLAVULANATE 875-125 MG PO TABS: 1.0000 | ORAL_TABLET | Freq: Two times a day (BID) | ORAL | 0 refills | Status: DC

## 2021-09-17 NOTE — ED Triage Notes (Signed)
Patient c/o left pointer finger swelling since cat scratch Tuesday evening. Cat up to date on shots.  ?

## 2021-09-17 NOTE — ED Provider Notes (Signed)
?[facility] EMERGENCY DEPARTMENT ?Provider Note ? ? ?CSN: [id number] ?Arrival date & time: 09/17/21  1533 ? ?  ? ?History ? ?Chief Complaint  ?Patient presents with  ? Finger swelling  ? ? ?Phyllis Hopkins is a 46 y.o. female. ? ?Phyllis Hopkins is a 46 y.o. female with a history of hypertension, who presents to the emergency department for evaluation of pain and swelling to her left index finger.  She reports 2 days ago she was playing with her cat and sustained a puncture wound to the palmar surface of the intermediate phalanx, she did not think much of it but then over the past few days has noticed some swelling and pain in the finger and that it is slightly red and warm.  She has not had any fevers.  No pain extending into the palm wrist or forearm she reports she is able to fully extend the finger without pain.  No other aggravating or alleviating factors.  Patient is not diabetic. ? ?The history is provided by the patient.  ? ?  ? ?Home Medications ?Prior to Admission medications   ?Medication Sig Start Date End Date Taking? Authorizing Provider  ?amoxicillin-clavulanate (AUGMENTIN) 875-125 MG tablet Take 1 tablet by mouth 2 (two) times daily. One po bid x 7 days 09/17/21  Yes Benedetto Goad N, PA-C  ?amLODipine (NORVASC) 5 MG tablet TAKE 1 TABLET(5 MG) BY MOUTH DAILY 07/14/20   Saguier, Percell Miller, PA-C  ?aspirin EC 81 MG EC tablet Take 1 tablet (81 mg total) by mouth daily. 06/26/19   Donzetta Starch, NP  ?atorvastatin (LIPITOR) 40 MG tablet Take 1 tablet (40 mg total) by mouth daily at 6 PM. 12/13/19   Debbrah Alar, NP  ?ferrous sulfate 325 (65 FE) MG EC tablet Take 1 tablet (325 mg total) by mouth daily with breakfast. 03/26/20   Debbrah Alar, NP  ?labetalol (NORMODYNE) 200 MG tablet TAKE 1 TABLET(200 MG) BY MOUTH TWICE DAILY 01/12/21   Saguier, Percell Miller, PA-C  ?warfarin (COUMADIN) 5 MG tablet Take as directed by Coumadin Clinic ?Patient not taking: Reported on 07/21/2021 12/04/20    Debbrah Alar, NP  ?warfarin (COUMADIN) 7.5 MG tablet Take as directed by Coumadin Clinic 12/04/20   Debbrah Alar, NP  ?   ? ?Allergies    ?Patient has no known allergies.   ? ?Review of Systems   ?Review of Systems  ?Constitutional:  Negative for chills and fever.  ?Musculoskeletal:  Positive for arthralgias and joint swelling.  ?Skin:  Positive for wound.  ?Neurological:  Negative for weakness and numbness.  ? ?Physical Exam ?Updated Vital Signs ?BP (!) 190/119 (BP Location: Right Arm)   Pulse (!) 59   Temp 98.4 ?F (36.9 ?C) (Oral)   Resp 16   Ht '5\' 2"'$  (1.575 m)   Wt 72.6 kg   LMP 08/21/2021   SpO2 100%   BMI 29.26 kg/m?  ?Physical Exam ?Vitals and nursing note reviewed.  ?Constitutional:   ?   General: She is not in acute distress. ?   Appearance: Normal appearance. She is well-developed. She is not ill-appearing or diaphoretic.  ?HENT:  ?   Head: Normocephalic and atraumatic.  ?Eyes:  ?   General:     ?   Right eye: No discharge.     ?   Left eye: No discharge.  ?Pulmonary:  ?   Effort: Pulmonary effort is normal. No respiratory distress.  ?Musculoskeletal:  ?   Comments: Left index finger with a  very small puncture wound over the palmar surface of the intermediate phalanx with no expressible drainage, there is mild swelling over the palmar surface of the finger that does not extend into the palm with slight erythema and warmth.  No swelling over the dorsal surface of the finger, patient has slightly reduced range of motion with flexion but is able to fully extend the finger without severe pain, no pain extending into the palm wrist or forearm with range of motion.  ?Skin: ?   General: Skin is warm and dry.  ?Neurological:  ?   Mental Status: She is alert and oriented to person, place, and time.  ?   Coordination: Coordination normal.  ?Psychiatric:     ?   Mood and Affect: Mood normal.     ?   Behavior: Behavior normal.  ? ? ?ED Results / Procedures / Treatments   ?Labs ?(all labs ordered  are listed, but only abnormal results are displayed) ?Labs Reviewed - No data to display ? ?EKG ?None ? ?Radiology ?No results found. ? ?Procedures ?Procedures  ? ? ?Medications Ordered in ED ?Medications  ?amoxicillin-clavulanate (AUGMENTIN) 875-125 MG per tablet 1 tablet (1 tablet Oral Given 09/17/21 1637)  ? ? ?ED Course/ Medical Decision Making/ A&P ?  ?                        ?Medical Decision Making ? ?46 year old female presents with some pain and swelling to the left index finger after a cat scratch.  There is a small puncture wound noted over the finger without expressible drainage, no clear area of fluctuance but there is some mild swelling throughout the palmar surface of the left index finger, no swelling over the dorsum and patient is able to fully extend the finger without severe pain, is able to flex the finger with slightly reduced range of motion, no extension of pain into the palm or forearm.  Presentation consistent with likely cellulitis of the finger but I do not see fluid collection or evidence of flexor tenosynovitis at this time.  Will treat with Augmentin and have patient follow-up with Dr. Fredna Dow with hand surgery.  Stressed the importance to the patient that if swelling, pain and range of motion of the finger worsen over the weekend she should immediately return to the emergency department for further evaluation.  Patient expresses understanding.  Discharged home in good condition. ? ? ? ? ? ? ? ?Final Clinical Impression(s) / ED Diagnoses ?Final diagnoses:  ?Finger infection  ? ? ?Rx / DC Orders ?ED Discharge Orders   ? ?      Ordered  ?  amoxicillin-clavulanate (AUGMENTIN) 875-125 MG tablet  2 times daily       ? 09/17/21 1700  ? ?  ?  ? ?  ? ? ?  ?Jacqlyn Larsen, [place] ?09/17/21 1749 ? ?  ?Margette Fast, MD ?09/18/21 2349 ? ?

## 2021-09-17 NOTE — ED Notes (Signed)
Pt. Is not taking her B/P meds as she should she reports to RN ?

## 2021-09-17 NOTE — Discharge Instructions (Addendum)
Please take antibiotics twice daily for the next 7 days, make sure you complete antibiotics even if symptoms of infection completely resolved.  You can use Tylenol as needed for pain.  You can also apply ice.  If symptoms or not resolving you can follow-up with Dr. Fredna Dow with hand surgery, but over the weekend if you notice that swelling or pain in the finger is worsening, you cannot completely straighten your finger or start experiencing pain or swelling extending down into the hand you should return to the emergency department at [facility] for further evaluation. ?

## 2021-09-17 NOTE — ED Notes (Signed)
Pt. Has a swollen red finger from a cat scratch.  Pt. Is in no distress. ?

## 2021-10-07 ENCOUNTER — Other Ambulatory Visit: Payer: Self-pay | Admitting: Medical

## 2021-11-07 ENCOUNTER — Other Ambulatory Visit: Payer: Self-pay | Admitting: Family

## 2021-11-07 DIAGNOSIS — D6861 Antiphospholipid syndrome: Secondary | ICD-10-CM

## 2021-11-08 ENCOUNTER — Telehealth: Payer: Self-pay | Admitting: Family

## 2021-11-08 NOTE — Telephone Encounter (Signed)
See mychart.  

## 2022-01-20 ENCOUNTER — Inpatient Hospital Stay: Payer: Federal, State, Local not specified - PPO | Admitting: Hematology and Oncology

## 2022-01-20 ENCOUNTER — Other Ambulatory Visit: Payer: Self-pay | Admitting: Hematology and Oncology

## 2022-01-20 ENCOUNTER — Inpatient Hospital Stay: Payer: Federal, State, Local not specified - PPO

## 2022-01-20 ENCOUNTER — Telehealth: Payer: Self-pay | Admitting: Hematology and Oncology

## 2022-01-20 DIAGNOSIS — D6861 Antiphospholipid syndrome: Secondary | ICD-10-CM

## 2022-01-20 NOTE — Telephone Encounter (Signed)
Per 8/2 phone line, pt called to r/s appointment   appointment r/s per pt request

## 2022-02-23 ENCOUNTER — Other Ambulatory Visit: Payer: Self-pay

## 2022-02-23 ENCOUNTER — Inpatient Hospital Stay: Payer: Federal, State, Local not specified - PPO | Admitting: Hematology and Oncology

## 2022-02-23 ENCOUNTER — Inpatient Hospital Stay: Payer: Federal, State, Local not specified - PPO | Attending: Hematology and Oncology

## 2022-02-23 VITALS — BP 161/89 | HR 65 | Temp 97.9°F | Resp 17 | Ht 62.0 in | Wt 159.4 lb

## 2022-02-23 DIAGNOSIS — D6861 Antiphospholipid syndrome: Secondary | ICD-10-CM | POA: Diagnosis not present

## 2022-02-23 DIAGNOSIS — D500 Iron deficiency anemia secondary to blood loss (chronic): Secondary | ICD-10-CM | POA: Diagnosis not present

## 2022-02-23 DIAGNOSIS — D509 Iron deficiency anemia, unspecified: Secondary | ICD-10-CM | POA: Insufficient documentation

## 2022-02-23 LAB — CBC WITH DIFFERENTIAL (CANCER CENTER ONLY)
Abs Immature Granulocytes: 0.01 10*3/uL (ref 0.00–0.07)
Basophils Absolute: 0 10*3/uL (ref 0.0–0.1)
Basophils Relative: 0 %
Eosinophils Absolute: 0 10*3/uL (ref 0.0–0.5)
Eosinophils Relative: 1 %
HCT: 38.3 % (ref 36.0–46.0)
Hemoglobin: 12.9 g/dL (ref 12.0–15.0)
Immature Granulocytes: 0 %
Lymphocytes Relative: 37 %
Lymphs Abs: 1.7 10*3/uL (ref 0.7–4.0)
MCH: 29.8 pg (ref 26.0–34.0)
MCHC: 33.7 g/dL (ref 30.0–36.0)
MCV: 88.5 fL (ref 80.0–100.0)
Monocytes Absolute: 0.3 10*3/uL (ref 0.1–1.0)
Monocytes Relative: 7 %
Neutro Abs: 2.4 10*3/uL (ref 1.7–7.7)
Neutrophils Relative %: 55 %
Platelet Count: 521 10*3/uL — ABNORMAL HIGH (ref 150–400)
RBC: 4.33 MIL/uL (ref 3.87–5.11)
RDW: 14.5 % (ref 11.5–15.5)
WBC Count: 4.4 10*3/uL (ref 4.0–10.5)
nRBC: 0 % (ref 0.0–0.2)

## 2022-02-23 LAB — RETIC PANEL
Immature Retic Fract: 7.8 % (ref 2.3–15.9)
RBC.: 4.24 MIL/uL (ref 3.87–5.11)
Retic Count, Absolute: 33.5 10*3/uL (ref 19.0–186.0)
Retic Ct Pct: 0.8 % (ref 0.4–3.1)
Reticulocyte Hemoglobin: 33.3 pg (ref >27.9)

## 2022-02-23 LAB — CMP (CANCER CENTER ONLY)
ALT: 11 U/L (ref 0–44)
AST: 16 U/L (ref 15–41)
Albumin: 4.5 g/dL (ref 3.5–5.0)
Alkaline Phosphatase: 49 U/L (ref 38–126)
Anion gap: 6 (ref 5–15)
BUN: 14 mg/dL (ref 6–20)
CO2: 27 mmol/L (ref 22–32)
Calcium: 9.6 mg/dL (ref 8.9–10.3)
Chloride: 106 mmol/L (ref 98–111)
Creatinine: 0.93 mg/dL (ref 0.44–1.00)
GFR, Estimated: >60 mL/min (ref >60)
Glucose, Bld: 105 mg/dL — ABNORMAL HIGH (ref 70–99)
Potassium: 3.6 mmol/L (ref 3.5–5.1)
Sodium: 139 mmol/L (ref 135–145)
Total Bilirubin: 0.6 mg/dL (ref 0.3–1.2)
Total Protein: 8 g/dL (ref 6.5–8.1)

## 2022-02-23 LAB — IRON AND IRON BINDING CAPACITY (CC-WL,HP ONLY)
Iron: 80 ug/dL (ref 28–170)
Saturation Ratios: 24 % (ref 10.4–31.8)
TIBC: 328 ug/dL (ref 250–450)
UIBC: 248 ug/dL (ref 148–442)

## 2022-02-23 LAB — FERRITIN: Ferritin: 26 ng/mL (ref 11–307)

## 2022-02-23 NOTE — Progress Notes (Signed)
[place] [phone]   Fax:([phone]  PROGRESS NOTE  Patient Care Team: Debbrah Alar, NP as PCP - General (Internal Medicine)  Hematological/Oncological History # Positive APS Antibodies in Setting of CVA 1) 06/20/2019: Left MCA stroke. Started on aspirin therapy '81mg'$  daily.   2) 06/21/2019: Beta-2-glycoprotein IgG 65, Anticardiolipin IgG 66 3) 10/04/2019: as part of neurology workup, patient underwent APS testing. Found to have Anticardiolipin IgG elevated at 79, Beta 2 Glycoprotein IgG elevated at 88.  4) 11/14/2019: establish care with Dr. Lorenso Courier    Interval History:  Phyllis Hopkins 46 y.o. female with medical history significant for antiphospholipid antibody syndrome with CVA presents for a follow up visit. The patient's last visit was on 07/21/2021. In the interim since the last visit the patient has been continued on systemic anticoagulation with coumadin therapy.   On exam today Phyllis Hopkins notes she has been well in the interim since her last visit in January.  She reports that she has had no negative events on the Coumadin therapy.  Her INR is typically on target between 2 and 3.  She reports no bleeding, bruising, or dark stools.  She is not having any nosebleeding or gum bleeding.  She reports that she is taking her iron pills not having any stomach difficulty though does occasionally have a little bit of constipation.  She is drinking plenty of water.  She notes her energy levels are great.  She did have an emergency room visit in March 2023 when she was scratched by her cat.  Otherwise she has been quite well..  She currently denies any fevers, chills, sweats, nausea, vomiting or diarrhea.  A full 10 point ROS is listed below  MEDICAL HISTORY:  Past Medical History:  Diagnosis Date   Blood in stool    hx of   Heart murmur    Hypertension     SURGICAL HISTORY: Past Surgical History:  Procedure Laterality Date   BUBBLE STUDY   06/25/2019   Procedure: BUBBLE STUDY;  Surgeon: Donato Heinz, MD;  Location: [facility] ENDOSCOPY;  Service: Endoscopy;;   TEE WITHOUT CARDIOVERSION N/A 06/25/2019   Procedure: TRANSESOPHAGEAL ECHOCARDIOGRAM (TEE);  Surgeon: Donato Heinz, MD;  Location: [facility] ENDOSCOPY;  Service: Endoscopy;  Laterality: N/A;    SOCIAL HISTORY: Social History   Socioeconomic History   Marital status: Divorced    Spouse name: Not on file   Number of children: Not on file   Years of education: Not on file   Highest education level: Not on file  Occupational History   Occupation: respiratory therapistory therapist    Comment: [place]  Tobacco Use   Smoking status: Never   Smokeless tobacco: Never  Substance and Sexual Activity   Alcohol use: No   Drug use: No   Sexual activity: Not on file  Other Topics Concern   Not on file  Social History Narrative   Divorced lives w/ cousin   Regular exercise; yes   Social Determinants of Health   Financial Resource Strain: Not on file  Food Insecurity: Not on file  Transportation Needs: Not on file  Physical Activity: Not on file  Stress: Not on file  Social Connections: Not on file  Intimate Partner Violence: Not on file    FAMILY HISTORY: Family History  Problem Relation Age of Onset   Hypertension Mother    Hypertension Father    Diabetes Father        diet controlled  ALLERGIES:  has No Known Allergies.  MEDICATIONS:  Current Outpatient Medications  Medication Sig Dispense Refill   amLODipine (NORVASC) 5 MG tablet TAKE 1 TABLET(5 MG) BY MOUTH DAILY 90 tablet 2   amoxicillin-clavulanate (AUGMENTIN) 875-125 MG tablet Take 1 tablet by mouth 2 (two) times daily. One po bid x 7 days 14 tablet 0   aspirin EC 81 MG EC tablet Take 1 tablet (81 mg total) by mouth daily.     atorvastatin (LIPITOR) 40 MG tablet Take 1 tablet (40 mg total) by mouth daily at 6 PM. 30 tablet 2   ferrous sulfate 325 (65 FE) MG EC tablet Take 1 tablet (325  mg total) by mouth daily with breakfast. 30 tablet 3   labetalol (NORMODYNE) 200 MG tablet TAKE 1 TABLET(200 MG) BY MOUTH TWICE DAILY 180 tablet 0   warfarin (COUMADIN) 7.5 MG tablet Take as directed by Coumadin Clinic 24 tablet 5   No current facility-administered medications for this visit.    REVIEW OF SYSTEMS:   Constitutional: ( - ) fevers, ( - )  chills , ( - ) night sweats Eyes: ( - ) blurriness of vision, ( - ) double vision, ( - ) watery eyes Ears, nose, mouth, throat, and face: ( - ) mucositis, ( - ) sore throat Respiratory: ( - ) cough, ( - ) dyspnea, ( - ) wheezes Cardiovascular: ( - ) palpitation, ( - ) chest discomfort, ( - ) lower extremity swelling Gastrointestinal:  ( - ) nausea, ( - ) heartburn, ( - ) change in bowel habits Skin: ( - ) abnormal skin rashes Lymphatics: ( - ) new lymphadenopathy, ( - ) easy bruising Neurological: ( - ) numbness, ( - ) tingling, ( - ) new weaknesses Behavioral/Psych: ( - ) mood change, ( - ) new changes  All other systems were reviewed with the patient and are negative.  PHYSICAL EXAMINATION: ECOG PERFORMANCE STATUS: 0 - Asymptomatic  Vitals:   02/23/22 1051  BP: (!) 161/89  Pulse: 65  Resp: 17  Temp: 97.9 F (36.6 C)  SpO2: 99%     Filed Weights   02/23/22 1051  Weight: 159 lb 6.4 oz (72.3 kg)      GENERAL: well appearing middle aged [place] American female, alert, no distress and comfortable SKIN: skin color, texture, turgor are normal, no rashes or significant lesions EYES: conjunctiva are pink and non-injected, sclera clear LUNGS: clear to auscultation and percussion with normal breathing effort HEART: regular rate & rhythm and no murmurs and no lower extremity edema Musculoskeletal: no cyanosis of digits and no clubbing  PSYCH: alert & oriented x 3, fluent speech NEURO: no focal motor/sensory deficits  LABORATORY DATA:  I have reviewed the data as listed    Latest Ref Rng & Units 02/23/2022   10:24 AM 07/21/2021    10:23 AM 01/07/2021   11:30 AM  CBC  WBC 4.0 - 10.5 K/uL 4.4  4.1  4.6   Hemoglobin 12.0 - 15.0 g/dL 12.9  12.4  12.0   Hematocrit 36.0 - 46.0 % 38.3  37.2  36.3   Platelets 150 - 400 K/uL 521  476  525        Latest Ref Rng & Units 02/23/2022   10:24 AM 07/21/2021   10:23 AM 01/07/2021   11:30 AM  CMP  Glucose 70 - 99 mg/dL 105  101  128   BUN 6 - 20 mg/dL '14  10  13   '$ Creatinine 0.44 -  1.00 mg/dL 0.93  0.87  0.79   Sodium 135 - 145 mmol/L 139  137  141   Potassium 3.5 - 5.1 mmol/L 3.6  4.1  3.8   Chloride 98 - 111 mmol/L 106  106  103   CO2 22 - 32 mmol/L '27  27  28   '$ Calcium 8.9 - 10.3 mg/dL 9.6  9.2  9.4   Total Protein 6.5 - 8.1 g/dL 8.0  7.5  7.9   Total Bilirubin 0.3 - 1.2 mg/dL 0.6  0.3  0.8   Alkaline Phos 38 - 126 U/L 49  42  42   AST 15 - 41 U/L '16  13  16   '$ ALT 0 - 44 U/L '11  9  12     '$ RADIOGRAPHIC STUDIES: I have personally reviewed the radiological images as listed and agreed with the findings in the report. No results found.  ASSESSMENT & PLAN Phyllis Hopkins 46 y.o. female with medical history significant for antiphospholipid antibody syndrome with CVA presents for a follow up visit.  Her review of the labs, discussion with the patient her findings are most consistent with antiphospholipid antibody syndrome as well as an unrelated iron deficiency anemia secondary to GYN blood loss.    She has continued on coumadin therapy and has tolerated it well.  Additionally her hemoglobin has improved from prior.  Her serum iron levels do appear to be improving at her last visit.  Our plan will be to have the patient continue to follow with the Coumadin clinic as well as return to our clinic in 6 months time in order to assure that she has stable iron levels.  # Positive APS Antibodies in Setting of CVA # Antiphospholipid Antibody Syndrome --Patient currently meets clinical/laboratory criteria and the diagnosis of APS was confirmed with repeat testing of antibodies in  12 weeks time. --after CVA with APS it is recommended that patients be on lifelong therapy with coumadin. DOACs are inferior to coumadin in the setting of APS --continue with coumadin clinic through her primary care. Unfortunately we do no offer coumadin clinic services at the [facility]. She currently follows with another coumadin clinic.  --RTC in 6 months to assure patient is stable on coumadin and to assess her iron deficiency anemia.    #Iron Deficiency Anemia 2/2 to Blood Loss --previously reportedheavy menstrual cycles as cause of blood loss/iron deficiency --repeat iron panel for the patient today --Hgb stable at 12.9, MCV 88.5, WBC 4.1, Plt 521.  --continue ferrous sulfate '325mg'$  PO daily with a source of vitamin C --if unable to maintain Hgb with PO therapy will consider IV iron therapy instead.  --continue to monitor  No orders of the defined types were placed in this encounter.   All questions were answered. The patient knows to call the clinic with any problems, questions or concerns.  A total of more than 30 minutes were spent on this encounter and over half of that time was spent on counseling and coordination of care as outlined above.   Ledell Peoples, MD Department of Hematology/Oncology [place] [facility] Phone: [phone] Pager: [phone] Email: [email or web address]  02/23/2022 2:24 PM

## 2022-05-07 DIAGNOSIS — Z114 Encounter for screening for human immunodeficiency virus [HIV]: Secondary | ICD-10-CM | POA: Diagnosis not present

## 2022-05-07 DIAGNOSIS — Z113 Encounter for screening for infections with a predominantly sexual mode of transmission: Secondary | ICD-10-CM | POA: Diagnosis not present

## 2022-05-07 DIAGNOSIS — B3731 Acute candidiasis of vulva and vagina: Secondary | ICD-10-CM | POA: Diagnosis not present

## 2022-06-28 ENCOUNTER — Telehealth: Payer: Self-pay | Admitting: Hematology and Oncology

## 2022-06-28 NOTE — Telephone Encounter (Signed)
Called patient to r/s 3/5 appointment due to provider PAL. Patient r/s and notified.

## 2022-08-24 ENCOUNTER — Other Ambulatory Visit: Payer: Federal, State, Local not specified - PPO

## 2022-08-24 ENCOUNTER — Ambulatory Visit: Payer: Federal, State, Local not specified - PPO | Admitting: Hematology and Oncology

## 2022-08-31 ENCOUNTER — Other Ambulatory Visit: Payer: Self-pay | Admitting: Hematology and Oncology

## 2022-08-31 DIAGNOSIS — D500 Iron deficiency anemia secondary to blood loss (chronic): Secondary | ICD-10-CM

## 2022-09-01 ENCOUNTER — Inpatient Hospital Stay: Payer: Federal, State, Local not specified - PPO

## 2022-09-01 ENCOUNTER — Telehealth: Payer: Self-pay | Admitting: *Deleted

## 2022-09-01 ENCOUNTER — Telehealth: Payer: Self-pay | Admitting: Family

## 2022-09-01 ENCOUNTER — Inpatient Hospital Stay: Payer: Federal, State, Local not specified - PPO | Admitting: Hematology and Oncology

## 2022-09-01 NOTE — Telephone Encounter (Signed)
Per 3/13 IB reached out to patient to reschedule missed appointment, patient aware of date and time of appointment.

## 2022-09-01 NOTE — Telephone Encounter (Signed)
PC to patient regarding missed appointments today, no answer, unable to leave voice mail as mailbox was full.  Scheduling message sent.

## 2022-09-12 NOTE — Progress Notes (Signed)
[place] [phone]   Fax:([phone]) [phone]  PROGRESS NOTE  Patient Care Team: Debbrah Alar, NP as PCP - General (Internal Medicine)  Hematological/Oncological History # Positive APS Antibodies in Setting of CVA 1) 06/20/2019: Left MCA stroke. Started on aspirin therapy 81mg  daily.   2) 06/21/2019: Beta-2-glycoprotein IgG 65, Anticardiolipin IgG 66 3) 10/04/2019: as part of neurology workup, patient underwent APS testing. Found to have Anticardiolipin IgG elevated at 79, Beta 2 Glycoprotein IgG elevated at 88.  4) 11/14/2019: establish care with Dr. Lorenso Courier    Interval History:  Phyllis Hopkins 47 y.o. female with medical history significant for antiphospholipid antibody syndrome with CVA presents for a follow up visit. The patient's last visit was on 02/23/2022. In the interim since the last visit the patient has been continued on systemic anticoagulation with coumadin therapy.   On exam today Phyllis Hopkins notes she feels good overall interim since her last visit.  She reports her energy right now is a 10 out of 10.  She reports that this morning she was able to walk the dog and get all of her activities done.  Her appetite has been good though she reports she is not taking her iron pills as faithfully as she should.  She reports that she takes them "on and off".  She notes that did not cause any difficulty but that she is "slacking".  She notes that she does enjoy eating red meat and her body has been craving it lately.  She notes that she is faithfully taking her Coumadin medication and typically stays within the 2-3 range.  She sees the Coumadin clinic every other month.  She notes that she has not been having any overt bleeding, bruising, or dark stools.  She is not having any signs or symptoms concerning for a recurrent clot/stroke.  Overall she feels well and has no questions comments or concerns today.  She currently denies any fevers, chills, sweats,  nausea, vomiting or diarrhea.  A full 10 point ROS is listed below  MEDICAL HISTORY:  Past Medical History:  Diagnosis Date   Blood in stool    hx of   Heart murmur    Hypertension     SURGICAL HISTORY: Past Surgical History:  Procedure Laterality Date   BUBBLE STUDY  06/25/2019   Procedure: BUBBLE STUDY;  Surgeon: Donato Heinz, MD;  Location: [facility] ENDOSCOPY;  Service: Endoscopy;;   TEE WITHOUT CARDIOVERSION N/A 06/25/2019   Procedure: TRANSESOPHAGEAL ECHOCARDIOGRAM (TEE);  Surgeon: Donato Heinz, MD;  Location: [facility] ENDOSCOPY;  Service: Endoscopy;  Laterality: N/A;    SOCIAL HISTORY: Social History   Socioeconomic History   Marital status: Divorced    Spouse name: Not on file   Number of children: Not on file   Years of education: Not on file   Highest education level: Not on file  Occupational History   Occupation: respiratory therapistory therapist    Comment: [place] [employer]  Tobacco Use   Smoking status: Never   Smokeless tobacco: Never  Substance and Sexual Activity   Alcohol use: No   Drug use: No   Sexual activity: Not on file  Other Topics Concern   Not on file  Social History Narrative   Divorced lives w/ cousin   Regular exercise; yes   Social Determinants of Health   Financial Resource Strain: Not on file  Food Insecurity: Not on file  Transportation Needs: Not on file  Physical Activity: Not on file  Stress:  Not on file  Social Connections: Not on file  Intimate Partner Violence: Not on file    FAMILY HISTORY: Family History  Problem Relation Age of Onset   Hypertension Mother    Hypertension Father    Diabetes Father        diet controlled    ALLERGIES:  has No Known Allergies.  MEDICATIONS:  Current Outpatient Medications  Medication Sig Dispense Refill   amLODipine (NORVASC) 5 MG tablet TAKE 1 TABLET(5 MG) BY MOUTH DAILY 90 tablet 2   aspirin EC 81 MG EC tablet Take 1 tablet (81 mg total) by mouth daily.     atorvastatin  (LIPITOR) 40 MG tablet Take 1 tablet (40 mg total) by mouth daily at 6 PM. 30 tablet 2   ferrous sulfate 325 (65 FE) MG EC tablet Take 1 tablet (325 mg total) by mouth daily with breakfast. 30 tablet 3   labetalol (NORMODYNE) 200 MG tablet TAKE 1 TABLET(200 MG) BY MOUTH TWICE DAILY 180 tablet 0   warfarin (COUMADIN) 7.5 MG tablet Take as directed by Coumadin Clinic 24 tablet 5   No current facility-administered medications for this visit.    REVIEW OF SYSTEMS:   Constitutional: ( - ) fevers, ( - )  chills , ( - ) night sweats Eyes: ( - ) blurriness of vision, ( - ) double vision, ( - ) watery eyes Ears, nose, mouth, throat, and face: ( - ) mucositis, ( - ) sore throat Respiratory: ( - ) cough, ( - ) dyspnea, ( - ) wheezes Cardiovascular: ( - ) palpitation, ( - ) chest discomfort, ( - ) lower extremity swelling Gastrointestinal:  ( - ) nausea, ( - ) heartburn, ( - ) change in bowel habits Skin: ( - ) abnormal skin rashes Lymphatics: ( - ) new lymphadenopathy, ( - ) easy bruising Neurological: ( - ) numbness, ( - ) tingling, ( - ) new weaknesses Behavioral/Psych: ( - ) mood change, ( - ) new changes  All other systems were reviewed with the patient and are negative.  PHYSICAL EXAMINATION: ECOG PERFORMANCE STATUS: 0 - Asymptomatic  Vitals:   09/13/22 1122  BP: (!) 143/87  Pulse: 64  Resp: 18  Temp: 97.8 F (36.6 C)  SpO2: 100%   Filed Weights   09/13/22 1122  Weight: 156 lb 12.8 oz (71.1 kg)     GENERAL: well appearing middle aged [place] American female, alert, no distress and comfortable SKIN: skin color, texture, turgor are normal, no rashes or significant lesions EYES: conjunctiva are pink and non-injected, sclera clear LUNGS: clear to auscultation and percussion with normal breathing effort HEART: regular rate & rhythm and no murmurs and no lower extremity edema Musculoskeletal: no cyanosis of digits and no clubbing  PSYCH: alert & oriented x 3, fluent speech NEURO: no  focal motor/sensory deficits  LABORATORY DATA:  I have reviewed the data as listed    Latest Ref Rng & Units 09/13/2022   11:04 AM 02/23/2022   10:24 AM 07/21/2021   10:23 AM  CBC  WBC 4.0 - 10.5 K/uL 3.7  4.4  4.1   Hemoglobin 12.0 - 15.0 g/dL 12.8  12.9  12.4   Hematocrit 36.0 - 46.0 % 38.0  38.3  37.2   Platelets 150 - 400 K/uL 539  521  476        Latest Ref Rng & Units 02/23/2022   10:24 AM 07/21/2021   10:23 AM 01/07/2021   11:30 AM  CMP  Glucose 70 - 99 mg/dL 105  101  128   BUN 6 - 20 mg/dL 14  10  13    Creatinine 0.44 - 1.00 mg/dL 0.93  0.87  0.79   Sodium 135 - 145 mmol/L 139  137  141   Potassium 3.5 - 5.1 mmol/L 3.6  4.1  3.8   Chloride 98 - 111 mmol/L 106  106  103   CO2 22 - 32 mmol/L 27  27  28    Calcium 8.9 - 10.3 mg/dL 9.6  9.2  9.4   Total Protein 6.5 - 8.1 g/dL 8.0  7.5  7.9   Total Bilirubin 0.3 - 1.2 mg/dL 0.6  0.3  0.8   Alkaline Phos 38 - 126 U/L 49  42  42   AST 15 - 41 U/L 16  13  16    ALT 0 - 44 U/L 11  9  12      RADIOGRAPHIC STUDIES: No results found.  ASSESSMENT & PLAN Phyllis Hopkins 47 y.o. female with medical history significant for antiphospholipid antibody syndrome with CVA presents for a follow up visit.  Her review of the labs, discussion with the patient her findings are most consistent with antiphospholipid antibody syndrome as well as an unrelated iron deficiency anemia secondary to GYN blood loss.    She has continued on coumadin therapy and has tolerated it well.  Additionally her hemoglobin has improved from prior.  Her serum iron levels do appear to be improving at her last visit.  Our plan will be to have the patient continue to follow with the Coumadin clinic as well as return to our clinic in 6 months time in order to assure that she has stable iron levels.  # Positive APS Antibodies in Setting of CVA # Antiphospholipid Antibody Syndrome --Patient currently meets clinical/laboratory criteria and the diagnosis of APS was  confirmed with repeat testing of antibodies in 12 weeks time. --after CVA with APS it is recommended that patients be on lifelong therapy with coumadin. DOACs are inferior to coumadin in the setting of APS --continue with coumadin clinic through her primary care. Unfortunately we do no offer coumadin clinic services at the [facility]. She currently follows with another coumadin clinic.  --RTC in 6 months to assure patient is stable on coumadin and to assess her iron deficiency anemia.    #Iron Deficiency Anemia 2/2 to Blood Loss --previously reportedheavy menstrual cycles as cause of blood loss/iron deficiency --repeat iron panel for the patient today --Labs today show white blood cell count 3.7, hemoglobin 12.8, MCV 88.6, and platelets of 539 --continue ferrous sulfate 325mg  PO daily with a source of vitamin C --if unable to maintain Hgb with PO therapy will consider IV iron therapy instead.  --continue to monitor  No orders of the defined types were placed in this encounter.   All questions were answered. The patient knows to call the clinic with any problems, questions or concerns.  A total of more than 30 minutes were spent on this encounter and over half of that time was spent on counseling and coordination of care as outlined above.   Ledell Peoples, MD Department of Hematology/Oncology [place] [facility] Phone: [phone] Pager: [phone] Email: [email or web address]  09/13/2022 11:43 AM

## 2022-09-13 ENCOUNTER — Telehealth: Payer: Self-pay | Admitting: Family

## 2022-09-13 ENCOUNTER — Inpatient Hospital Stay: Payer: Federal, State, Local not specified - PPO | Attending: Hematology and Oncology

## 2022-09-13 ENCOUNTER — Inpatient Hospital Stay: Payer: Federal, State, Local not specified - PPO | Admitting: Hematology and Oncology

## 2022-09-13 ENCOUNTER — Other Ambulatory Visit: Payer: Self-pay

## 2022-09-13 VITALS — BP 143/87 | HR 64 | Temp 97.8°F | Resp 18 | Ht 62.0 in | Wt 156.8 lb

## 2022-09-13 DIAGNOSIS — Z7901 Long term (current) use of anticoagulants: Secondary | ICD-10-CM | POA: Insufficient documentation

## 2022-09-13 DIAGNOSIS — D500 Iron deficiency anemia secondary to blood loss (chronic): Secondary | ICD-10-CM

## 2022-09-13 DIAGNOSIS — D6861 Antiphospholipid syndrome: Secondary | ICD-10-CM | POA: Diagnosis not present

## 2022-09-13 DIAGNOSIS — Z8673 Personal history of transient ischemic attack (TIA), and cerebral infarction without residual deficits: Secondary | ICD-10-CM | POA: Insufficient documentation

## 2022-09-13 LAB — CMP (CANCER CENTER ONLY)
ALT: 9 U/L (ref 0–44)
AST: 13 U/L — ABNORMAL LOW (ref 15–41)
Albumin: 4.3 g/dL (ref 3.5–5.0)
Alkaline Phosphatase: 50 U/L (ref 38–126)
Anion gap: 6 (ref 5–15)
BUN: 14 mg/dL (ref 6–20)
CO2: 29 mmol/L (ref 22–32)
Calcium: 9.7 mg/dL (ref 8.9–10.3)
Chloride: 104 mmol/L (ref 98–111)
Creatinine: 1.02 mg/dL — ABNORMAL HIGH (ref 0.44–1.00)
GFR, Estimated: >60 mL/min (ref >60)
Glucose, Bld: 121 mg/dL — ABNORMAL HIGH (ref 70–99)
Potassium: 3.3 mmol/L — ABNORMAL LOW (ref 3.5–5.1)
Sodium: 139 mmol/L (ref 135–145)
Total Bilirubin: 0.7 mg/dL (ref 0.3–1.2)
Total Protein: 7.5 g/dL (ref 6.5–8.1)

## 2022-09-13 LAB — RETIC PANEL
Immature Retic Fract: 6.2 % (ref 2.3–15.9)
RBC.: 4.3 MIL/uL (ref 3.87–5.11)
Retic Count, Absolute: 42.6 10*3/uL (ref 19.0–186.0)
Retic Ct Pct: 1 % (ref 0.4–3.1)
Reticulocyte Hemoglobin: 33.2 pg (ref >27.9)

## 2022-09-13 LAB — IRON AND IRON BINDING CAPACITY (CC-WL,HP ONLY)
Iron: 65 ug/dL (ref 28–170)
Saturation Ratios: 19 % (ref 10.4–31.8)
TIBC: 346 ug/dL (ref 250–450)
UIBC: 281 ug/dL (ref 148–442)

## 2022-09-13 LAB — CBC WITH DIFFERENTIAL (CANCER CENTER ONLY)
Abs Immature Granulocytes: 0 10*3/uL (ref 0.00–0.07)
Basophils Absolute: 0 10*3/uL (ref 0.0–0.1)
Basophils Relative: 0 %
Eosinophils Absolute: 0 10*3/uL (ref 0.0–0.5)
Eosinophils Relative: 1 %
HCT: 38 % (ref 36.0–46.0)
Hemoglobin: 12.8 g/dL (ref 12.0–15.0)
Immature Granulocytes: 0 %
Lymphocytes Relative: 39 %
Lymphs Abs: 1.4 10*3/uL (ref 0.7–4.0)
MCH: 29.8 pg (ref 26.0–34.0)
MCHC: 33.7 g/dL (ref 30.0–36.0)
MCV: 88.6 fL (ref 80.0–100.0)
Monocytes Absolute: 0.2 10*3/uL (ref 0.1–1.0)
Monocytes Relative: 6 %
Neutro Abs: 2 10*3/uL (ref 1.7–7.7)
Neutrophils Relative %: 54 %
Platelet Count: 539 10*3/uL — ABNORMAL HIGH (ref 150–400)
RBC: 4.29 MIL/uL (ref 3.87–5.11)
RDW: 14 % (ref 11.5–15.5)
WBC Count: 3.7 10*3/uL — ABNORMAL LOW (ref 4.0–10.5)
nRBC: 0 % (ref 0.0–0.2)

## 2022-09-13 LAB — FERRITIN: Ferritin: 20 ng/mL (ref 11–307)

## 2022-09-13 NOTE — Telephone Encounter (Signed)
Called but no answer and mail box was full. Mychart message sent.

## 2022-09-13 NOTE — Telephone Encounter (Signed)
Please contact patient to schedule a follow up visit.

## 2022-09-15 NOTE — Telephone Encounter (Signed)
Patient scheduled to come in 09/28/22

## 2022-09-27 ENCOUNTER — Telehealth: Payer: Self-pay | Admitting: Family

## 2022-09-27 ENCOUNTER — Ambulatory Visit: Payer: Federal, State, Local not specified - PPO | Admitting: Family

## 2022-09-27 VITALS — BP 170/107 | HR 58 | Temp 98.2°F | Resp 16 | Wt 157.0 lb

## 2022-09-27 DIAGNOSIS — E7800 Pure hypercholesterolemia, unspecified: Secondary | ICD-10-CM

## 2022-09-27 DIAGNOSIS — E876 Hypokalemia: Secondary | ICD-10-CM | POA: Diagnosis not present

## 2022-09-27 DIAGNOSIS — I10 Essential (primary) hypertension: Secondary | ICD-10-CM

## 2022-09-27 DIAGNOSIS — D6861 Antiphospholipid syndrome: Secondary | ICD-10-CM | POA: Diagnosis not present

## 2022-09-27 NOTE — Assessment & Plan Note (Addendum)
With hx of CVA, should be on  lifelong coumadin per hemaology for secondary stroke prevention. Reinforced the importance of this. She will restart her coumadin tonight and we will plan to repeat her INR in 1 week when she returns.

## 2022-09-27 NOTE — Assessment & Plan Note (Signed)
Due for lipid panel- declines today- will check next visit.

## 2022-09-27 NOTE — Progress Notes (Signed)
An   Subjective:   By signing my name below, I, Barrett ShellKennedy C Lynn, attest that this documentation has been prepared under the direction and in the presence of [name]'Sullivan, Vada Yellen, NP. 09/27/2022   Patient ID: Phyllis Hopkins, female    DOB: 03/05/1976, 47 y.o.   MRN: [id number]  Chief Complaint  Patient presents with   Hypertension    Here for follow up    HPI Patient is in today for a follow-up appointment. I have not seen her since 2021.   Blood pressure: Her blood pressure is elevated during this visit. She is not taking her 200 mg labetalol or 5 mg amlodipine on a regular basis.  BP Readings from Last 3 Encounters:  09/27/22 (!) 170/107  09/13/22 (!) 143/87  02/23/22 (!) 161/89   Potassium: Her potassium levels were low during her last blood work.   Colonoscopy: Last colonoscopy completed in 2017. During her last colonoscopy, inflammation was found in her colon. She took antibiotics to control the inflammation. She no longer has blood in her stools as a result. She cannot remember where her colonoscopy was completed.   Diet: She eats salad regularly, but reports that she could eat healthier meals at home more frequently.   Antiphospholipid syndrome/hx of CVA- she has not been taking her coumadin regularly.  She is not following with any other clinic for INR checks.   Past Medical History:  Diagnosis Date   Blood in stool    hx of   Heart murmur    Hypertension     Past Surgical History:  Procedure Laterality Date   BUBBLE STUDY  06/25/2019   Procedure: BUBBLE STUDY;  Surgeon: Little IshikawaSchumann, Christopher L, MD;  [facility];  Service: Endoscopy;;   TEE WITHOUT CARDIOVERSION N/A 06/25/2019   Procedure: TRANSESOPHAGEAL ECHOCARDIOGRAM (TEE);  Surgeon: Little IshikawaSchumann, Christopher L, MD;  [facility];  Service: Endoscopy;  Laterality: N/A;    Family History  Problem Relation Age of Onset   Hypertension Mother    Hypertension Father    Diabetes Father         diet controlled    Social History   Socioeconomic History   Marital status: Divorced    Spouse name: Not on file   Number of children: Not on file   Years of education: Not on file   Highest education level: Not on file  Occupational History   Occupation: respiratory therapistory therapist    Comment: [place]  Tobacco Use   Smoking status: Never   Smokeless tobacco: Never  Substance and Sexual Activity   Alcohol use: No   Drug use: No   Sexual activity: Not on file  Other Topics Concern   Not on file  Social History Narrative   Divorced lives w/ cousin   Regular exercise; yes   Social Determinants of Health   Financial Resource Strain: Not on file  Food Insecurity: Not on file  Transportation Needs: Not on file  Physical Activity: Not on file  Stress: Not on file  Social Connections: Not on file  Intimate Partner Violence: Not on file    Outpatient Medications Prior to Visit  Medication Sig Dispense Refill   amLODipine (NORVASC) 5 MG tablet TAKE 1 TABLET(5 MG) BY MOUTH DAILY 90 tablet 2   aspirin EC 81 MG EC tablet Take 1 tablet (81 mg total) by mouth daily.     atorvastatin (LIPITOR) 40 MG tablet Take 1 tablet (40 mg total) by mouth daily at 6  PM. 30 tablet 2   ferrous sulfate 325 (65 FE) MG EC tablet Take 1 tablet (325 mg total) by mouth daily with breakfast. 30 tablet 3   labetalol (NORMODYNE) 200 MG tablet TAKE 1 TABLET(200 MG) BY MOUTH TWICE DAILY 180 tablet 0   warfarin (COUMADIN) 7.5 MG tablet Take as directed by Coumadin Clinic 24 tablet 5   No facility-administered medications prior to visit.    No Known Allergies  ROS See HPI    Objective:    Physical Exam Constitutional:      General: She is not in acute distress.    Appearance: Normal appearance.  HENT:     Head: Normocephalic and atraumatic.     Right Ear: External ear normal.     Left Ear: External ear normal.  Eyes:     Extraocular Movements: Extraocular movements intact.     Pupils:  Pupils are equal, round, and reactive to light.  Cardiovascular:     Rate and Rhythm: Normal rate and regular rhythm.     Heart sounds: Normal heart sounds. No murmur heard.    No gallop.  Pulmonary:     Effort: Pulmonary effort is normal. No respiratory distress.     Breath sounds: Normal breath sounds. No wheezing or rales.  Skin:    General: Skin is warm.  Neurological:     Mental Status: She is alert and oriented to person, place, and time.  Psychiatric:        Judgment: Judgment normal.     BP (!) 170/107 (BP Location: Left Arm, Patient Position: Sitting, Cuff Size: Small)   Pulse (!) 58   Temp 98.2 F (36.8 C) (Oral)   Resp 16   Wt 157 lb (71.2 kg)   SpO2 100%   BMI 28.72 kg/m  Wt Readings from Last 3 Encounters:  09/27/22 157 lb (71.2 kg)  09/13/22 156 lb 12.8 oz (71.1 kg)  02/23/22 159 lb 6.4 oz (72.3 kg)       Assessment & Plan:  Primary hypertension Assessment & Plan: Very uncontrolled. She admits to poor compliance with her medications. We discussed long term risks of uncontrolled HTN including CVA, Renal failure, heart failure. She is motivated to try to do better. We discussed meal prepping some healthy meals.  She plans to go straight home to take her bp medications and will return in 1 week for follow up bp check with labs (cmet, lipid panel).    Hypokalemia Assessment & Plan: K+ was mildly low last visit. She is not on any medications that should lower her K+. Encouraged her to increase her dietary intake of potassium rich foods. Plan to recheck next visit.   Antiphospholipid syndrome Assessment & Plan: With hx of CVA, should be on  lifelong coumadin per hemaology for secondary stroke prevention. Reinforced the importance of this. She will restart her coumadin tonight and we will plan to repeat her INR in 1 week when she returns.     Pure hypercholesterolemia Assessment & Plan: Due for lipid panel- declines today- will check next visit.      I,  Lemont Fillers, NP, personally preformed the services described in this documentation.  All medical record entries made by the scribe were at my direction and in my presence.  I have reviewed the chart and discharge instructions (if applicable) and agree that the record reflects my personal performance and is accurate and complete. 09/27/2022  Lemont Fillers, NP  [facility] as a scribe  for Lemont Fillers, NP.,have documented all relevant documentation on the behalf of Lemont Fillers, NP,as directed by  Lemont Fillers, NP while in the presence of Lemont Fillers, NP.

## 2022-09-27 NOTE — Assessment & Plan Note (Addendum)
Very uncontrolled. She admits to poor compliance with her medications. We discussed long term risks of uncontrolled HTN including CVA, Renal failure, heart failure. She is motivated to try to do better. We discussed meal prepping some healthy meals.  She plans to go straight home to take her bp medications and will return in 1 week for follow up bp check with labs (cmet, lipid panel).

## 2022-09-27 NOTE — Telephone Encounter (Signed)
See mychart.  

## 2022-09-27 NOTE — Assessment & Plan Note (Signed)
K+ was mildly low last visit. She is not on any medications that should lower her K+. Encouraged her to increase her dietary intake of potassium rich foods. Plan to recheck next visit.

## 2022-09-28 ENCOUNTER — Ambulatory Visit: Payer: Federal, State, Local not specified - PPO | Admitting: Family

## 2022-10-04 ENCOUNTER — Encounter: Payer: Self-pay | Admitting: Family

## 2022-10-04 ENCOUNTER — Ambulatory Visit: Payer: Federal, State, Local not specified - PPO | Admitting: Family

## 2022-10-04 VITALS — BP 115/71 | HR 62 | Temp 99.2°F | Resp 16 | Wt 157.0 lb

## 2022-10-04 DIAGNOSIS — E876 Hypokalemia: Secondary | ICD-10-CM | POA: Diagnosis not present

## 2022-10-04 DIAGNOSIS — Z7901 Long term (current) use of anticoagulants: Secondary | ICD-10-CM

## 2022-10-04 DIAGNOSIS — I10 Essential (primary) hypertension: Secondary | ICD-10-CM | POA: Diagnosis not present

## 2022-10-04 DIAGNOSIS — E7800 Pure hypercholesterolemia, unspecified: Secondary | ICD-10-CM

## 2022-10-04 DIAGNOSIS — D6861 Antiphospholipid syndrome: Secondary | ICD-10-CM

## 2022-10-04 LAB — COMPREHENSIVE METABOLIC PANEL
ALT: 9 U/L (ref 0–35)
AST: 12 U/L (ref 0–37)
Albumin: 4.1 g/dL (ref 3.5–5.2)
Alkaline Phosphatase: 48 U/L (ref 39–117)
BUN: 12 mg/dL (ref 6–23)
CO2: 24 mEq/L (ref 19–32)
Calcium: 9.1 mg/dL (ref 8.4–10.5)
Chloride: 106 mEq/L (ref 96–112)
Creatinine, Ser: 0.88 mg/dL (ref 0.40–1.20)
GFR: 77.55 mL/min (ref >60.00)
Glucose, Bld: 102 mg/dL — ABNORMAL HIGH (ref 70–99)
Potassium: 3.8 mEq/L (ref 3.5–5.1)
Sodium: 139 mEq/L (ref 135–145)
Total Bilirubin: 0.3 mg/dL (ref 0.2–1.2)
Total Protein: 7.2 g/dL (ref 6.0–8.3)

## 2022-10-04 LAB — POCT INR: INR: 2.7 (ref 2.0–3.0)

## 2022-10-04 LAB — LIPID PANEL
Cholesterol: 197 mg/dL (ref 0–200)
HDL: 54.5 mg/dL (ref >39.00)
LDL Cholesterol: 126 mg/dL — ABNORMAL HIGH (ref 0–99)
NonHDL: 142.37
Total CHOL/HDL Ratio: 4
Triglycerides: 83 mg/dL (ref 0.0–149.0)
VLDL: 16.6 mg/dL (ref 0.0–40.0)

## 2022-10-04 MED ORDER — WARFARIN SODIUM 7.5 MG PO TABS: ORAL_TABLET | ORAL | 0 refills | Status: DC

## 2022-10-04 NOTE — Assessment & Plan Note (Signed)
Continues coumadin therapy at 7.5 mg.  INR performed today and is therapeutic at 2.7.  Continue current dose with plan to repeat in 2 weeks with nurse.

## 2022-10-04 NOTE — Progress Notes (Signed)
Subjective:   By signing my name below, I, Shehryar Baig, attest that this documentation has been prepared under the direction and in the presence of [name], NP. 10/04/2022   Patient ID: Phyllis Hopkins, female    DOB: [birth year], 47 y.o.   MRN: [id number]  Chief Complaint  Patient presents with   Hypertension    Here for follow up    Hypertension   Patient is in today for a follow up visit.   Blood pressure: Her blood pressure is doing well during this visit. She continues taking 5 mg amlodipine daily PO, 200 g labetalol daily PO consistently and reports no new issues while taking them.  BP Readings from Last 3 Encounters:  10/04/22 115/71  09/27/22 (!) 170/107  09/13/22 (!) 143/87   Pulse Readings from Last 3 Encounters:  10/04/22 62  09/27/22 (!) 58  09/13/22 64   Iron: Her last iron levels were good. She continues taking 325 mg ferrous sulfate daily PO and reports no new issues while taking it.  Lab Results  Component Value Date   IRON 65 09/13/2022   TIBC 346 09/13/2022   FERRITIN 20 09/13/2022   INR: She continues taking 7.5 mg Warfarin and reports no new issues while taking it. She is requesting a refill on it as well.  Lab Results  Component Value Date   INR 1.5 (A) 01/21/2021   INR 2.6 12/25/2020   INR 1.8 (A) 12/04/2020   Colonoscopy: She reports receiving her last colonoscopy at age 83.    Past Medical History:  Diagnosis Date   Blood in stool    hx of   Heart murmur    Hypertension    Syncope     Past Surgical History:  Procedure Laterality Date   BUBBLE STUDY  06/25/2019   Procedure: BUBBLE STUDY;  Surgeon: Little Ishikawa, MD;  Location: [facility] ENDOSCOPY;  Service: Endoscopy;;   TEE WITHOUT CARDIOVERSION N/A 06/25/2019   Procedure: TRANSESOPHAGEAL ECHOCARDIOGRAM (TEE);  Surgeon: Little Ishikawa, MD;  Location: [facility] ENDOSCOPY;  Service: Endoscopy;  Laterality: N/A;    Family History  Problem Relation Age of Onset    Hypertension Mother    Hypertension Father    Diabetes Father        diet controlled    Social History   Socioeconomic History   Marital status: Divorced    Spouse name: Not on file   Number of children: Not on file   Years of education: Not on file   Highest education level: Not on file  Occupational History   Occupation: respiratory therapistory therapist    Comment: [employer]  Tobacco Use   Smoking status: Never   Smokeless tobacco: Never  Substance and Sexual Activity   Alcohol use: No   Drug use: No   Sexual activity: Not on file  Other Topics Concern   Not on file  Social History Narrative   Divorced lives w/ cousin   Regular exercise; yes   Social Determinants of Health   Financial Resource Strain: Not on file  Food Insecurity: Not on file  Transportation Needs: Not on file  Physical Activity: Not on file  Stress: Not on file  Social Connections: Not on file  Intimate Partner Violence: Not on file    Outpatient Medications Prior to Visit  Medication Sig Dispense Refill   amLODipine (NORVASC) 5 MG tablet TAKE 1 TABLET(5 MG) BY MOUTH DAILY 90 tablet 2   aspirin EC 81 MG EC  tablet Take 1 tablet (81 mg total) by mouth daily.     atorvastatin (LIPITOR) 40 MG tablet Take 1 tablet (40 mg total) by mouth daily at 6 PM. 30 tablet 2   ferrous sulfate 325 (65 FE) MG EC tablet Take 1 tablet (325 mg total) by mouth daily with breakfast. 30 tablet 3   labetalol (NORMODYNE) 200 MG tablet TAKE 1 TABLET(200 MG) BY MOUTH TWICE DAILY 180 tablet 0   warfarin (COUMADIN) 7.5 MG tablet Take as directed by Coumadin Clinic 24 tablet 5   No facility-administered medications prior to visit.    No Known Allergies  ROS See HPI    Objective:    Physical Exam Constitutional:      General: She is not in acute distress.    Appearance: Normal appearance. She is not ill-appearing.  HENT:     Head: Normocephalic and atraumatic.     Right Ear: External ear normal.     Left Ear:  External ear normal.  Eyes:     Extraocular Movements: Extraocular movements intact.     Pupils: Pupils are equal, round, and reactive to light.  Cardiovascular:     Rate and Rhythm: Normal rate and regular rhythm.     Heart sounds: Normal heart sounds. No murmur heard.    No gallop.  Pulmonary:     Effort: Pulmonary effort is normal. No respiratory distress.     Breath sounds: Normal breath sounds. No wheezing or rales.  Skin:    General: Skin is warm and dry.  Neurological:     Mental Status: She is alert and oriented to person, place, and time.  Psychiatric:        Judgment: Judgment normal.     BP 115/71 (BP Location: Right Arm, Patient Position: Sitting, Cuff Size: Small)   Pulse 62   Temp 99.2 F (37.3 C) (Oral)   Resp 16   Wt 157 lb (71.2 kg)   SpO2 99%   BMI 28.72 kg/m  Wt Readings from Last 3 Encounters:  10/04/22 157 lb (71.2 kg)  09/27/22 157 lb (71.2 kg)  09/13/22 156 lb 12.8 oz (71.1 kg)       Assessment & Plan:  Hypokalemia Assessment & Plan: Repeat K+ today.   Orders: -     Comprehensive metabolic panel  Pure hypercholesterolemia Assessment & Plan: She is back on atorvastatin. Repeat lipid panel today.   Orders: -     Lipid panel  Antiphospholipid syndrome Assessment & Plan: Continues coumadin therapy at 7.5 mg.  INR performed today and is therapeutic at 2.7.  Continue current dose with plan to repeat in 2 weeks with nurse.   Orders: -     Warfarin Sodium; Take as directed by Coumadin Clinic  Dispense: 90 tablet; Refill: 0  Primary hypertension Assessment & Plan: BP much better back on amlodipine and labetalol. Continue same.  BP Readings from Last 3 Encounters:  10/04/22 115/71  09/27/22 (!) 170/107  09/13/22 (!) 143/87        I, Lemont Fillers, NP, personally preformed the services described in this documentation.  All medical record entries made by the scribe were at my direction and in my presence.  I have reviewed the  chart and discharge instructions (if applicable) and agree that the record reflects my personal performance and is accurate and complete. 10/04/2022   I,Shehryar Baig,acting as a scribe for Lemont Fillers, NP.,have documented all relevant documentation on the behalf of Lemont Fillers, NP,as  directed by  Nance Pear, NP while in the presence of Nance Pear, NP.   Nance Pear, NP

## 2022-10-04 NOTE — Assessment & Plan Note (Signed)
BP much better back on amlodipine and labetalol. Continue same.  BP Readings from Last 3 Encounters:  10/04/22 115/71  09/27/22 (!) 170/107  09/13/22 (!) 143/87

## 2022-10-04 NOTE — Assessment & Plan Note (Signed)
She is back on atorvastatin. Repeat lipid panel today.

## 2022-10-04 NOTE — Addendum Note (Signed)
Addended by: Wilford Corner on: 10/04/2022 12:25 PM   Modules accepted: Orders

## 2022-10-04 NOTE — Assessment & Plan Note (Signed)
Repeat K+ today.

## 2022-10-22 ENCOUNTER — Ambulatory Visit (INDEPENDENT_AMBULATORY_CARE_PROVIDER_SITE_OTHER): Payer: Federal, State, Local not specified - PPO

## 2022-10-22 DIAGNOSIS — Z7901 Long term (current) use of anticoagulants: Secondary | ICD-10-CM | POA: Diagnosis not present

## 2022-10-22 LAB — POCT INR: POC INR: 1.5

## 2022-10-22 NOTE — Progress Notes (Signed)
Pt here for INR check per [name] Np  Goal INR =2.0 to 3.0  Last INR = 2.7  Pt currently takes Coumadin Continues coumadin therapy at 7.5 mg  Pt denies recent antibiotics, no dietary changes and no unusual bruising / bleeding.  INR today = 1.5  Pt advised per Take 1 1/2 tablet 1 day and other days continue 7.5 recheck  2 weeks with nurse

## 2022-11-03 ENCOUNTER — Ambulatory Visit: Payer: Federal, State, Local not specified - PPO

## 2022-11-11 ENCOUNTER — Ambulatory Visit (INDEPENDENT_AMBULATORY_CARE_PROVIDER_SITE_OTHER): Payer: Federal, State, Local not specified - PPO

## 2022-11-11 DIAGNOSIS — Z7901 Long term (current) use of anticoagulants: Secondary | ICD-10-CM | POA: Diagnosis not present

## 2022-11-11 LAB — POCT INR: POC INR: 1.1

## 2022-11-11 NOTE — Progress Notes (Signed)
Pt here for INR check per [name] Np   Goal INR =2.0 to 3.0   Last INR = 1.5 on 10/22/22.   Patient was advised per pcp to change from taking Coumadin therapy at 7.5 mg daily on Wednesdays and start    Taking 1 1/2 tablet ( 11.25 mg) 1 day and other days continue 7.5 recheck  2 weeks with nurse  Pt denies recent antibiotics, no dietary changes and no unusual bruising / bleeding.   INR today = 1.1  Patient reports she has not been taking medication daily and has missed multiple doses in one week.  Advised per Dr. Abner Greenspan stay on same dose and return in one week to see if numbers have improved. Patient was scheduled to return Wednesday 11/17/22 due to, going on a cruise Thursday for 10 days.

## 2022-11-17 ENCOUNTER — Ambulatory Visit (INDEPENDENT_AMBULATORY_CARE_PROVIDER_SITE_OTHER): Payer: Federal, State, Local not specified - PPO

## 2022-11-17 DIAGNOSIS — Z7901 Long term (current) use of anticoagulants: Secondary | ICD-10-CM

## 2022-11-17 LAB — POCT INR: INR: 2.1 (ref 2.0–3.0)

## 2022-11-17 NOTE — Progress Notes (Signed)
Pt here for INR check per Melissa   Goal INR = 2.0--3.0  Last INR = 1.1  Pt currently takes Coumadin Taking 1 1/2 tablet ( 11.25 mg) 1 day and other days continue 7.5 recheck   Pt denies recent antibiotics, no dietary changes and no unusual bruising / bleeding.  INR today = 2.1  Pt advised per Melissa to continue current medication regime and to follow up in 2 weeks. Appt made

## 2022-11-30 ENCOUNTER — Other Ambulatory Visit: Payer: Self-pay | Admitting: Medical

## 2022-11-30 MED ORDER — LABETALOL HCL 200 MG PO TABS: 200.0000 mg | ORAL_TABLET | Freq: Two times a day (BID) | ORAL | 0 refills | Status: DC

## 2022-12-01 ENCOUNTER — Ambulatory Visit (INDEPENDENT_AMBULATORY_CARE_PROVIDER_SITE_OTHER): Payer: Federal, State, Local not specified - PPO

## 2022-12-01 DIAGNOSIS — Z7901 Long term (current) use of anticoagulants: Secondary | ICD-10-CM

## 2022-12-01 LAB — POCT INR: INR: 3.8 — AB (ref 2.0–3.0)

## 2022-12-01 NOTE — Progress Notes (Signed)
Pt here for INR check per  Goal INR =2.0 to 3.0  Last INR =2.1  Pt currently takes Coumadin 11.25 mg one day a week and 7.5 mg all other days.   Pt denies recent antibiotics, no dietary changes and no unusual bruising / bleeding.  INR today = 3.8 she had her 11.25 dose yesterday and none today yet.   Pt advised per Dr. Patsy Lager, hold today's dose and go back to 7.5 daily. Follow up in bout a week. Patient was scheduled for 6/25 due to her schedule.

## 2022-12-13 NOTE — Progress Notes (Unsigned)
Pt here for INR check per Dr Patsy Lager (was DOD at last INR check)  Goal INR = 2.0 - 3.0  Last INR = 3.8  Pt advised at last NV by Dr. Patsy Lager, to hold that days dose. Then go back to 7.5 daily. Follow up in bout a week. Patient was scheduled for 6/25 due to her schedule.   Pt denies recent antibiotics, no dietary changes and no unusual bruising / bleeding.  INR today = 1.5 Pt admits that she has missed a couple of doses.   Pt advised per [name]: continue 7.5 mg dose daily, recheck in 1 week. Appointment scheduled.

## 2022-12-14 ENCOUNTER — Ambulatory Visit (INDEPENDENT_AMBULATORY_CARE_PROVIDER_SITE_OTHER): Payer: Federal, State, Local not specified - PPO

## 2022-12-14 DIAGNOSIS — Z7901 Long term (current) use of anticoagulants: Secondary | ICD-10-CM | POA: Diagnosis not present

## 2022-12-14 LAB — POCT INR: INR: 1.5 — AB (ref 2.0–3.0)

## 2022-12-20 NOTE — Progress Notes (Unsigned)
Pt here for INR check perPt advised per [name] recheck in 1 week.   Goal INR = 2.0 - 3.0  Last INR = 1.5  Pt advised per [name] at last NV to: continue 7.5 mg dose daily, recheck in 1 week. Appointment scheduled.   Pt denies recent antibiotics, no dietary changes and no unusual bruising / bleeding.  INR today = 2.2  Pt advised per MO: Continue Coumadin at 7.5 mg dose. Recheck in 3 weeks.  No pending apts. Last ov 10/04/22- due 01/03/23 for 3 month f/u

## 2022-12-21 ENCOUNTER — Ambulatory Visit (INDEPENDENT_AMBULATORY_CARE_PROVIDER_SITE_OTHER): Payer: Federal, State, Local not specified - PPO

## 2022-12-21 DIAGNOSIS — Z7901 Long term (current) use of anticoagulants: Secondary | ICD-10-CM

## 2022-12-21 LAB — POCT INR: INR: 2.2 (ref 2.0–3.0)

## 2023-01-10 ENCOUNTER — Other Ambulatory Visit: Payer: Self-pay | Admitting: Family

## 2023-01-10 DIAGNOSIS — D6861 Antiphospholipid syndrome: Secondary | ICD-10-CM

## 2023-01-18 ENCOUNTER — Ambulatory Visit: Payer: Federal, State, Local not specified - PPO | Admitting: Family

## 2023-01-26 ENCOUNTER — Ambulatory Visit: Payer: Federal, State, Local not specified - PPO | Admitting: Family

## 2023-01-26 VITALS — BP 146/90 | HR 75 | Temp 98.6°F | Resp 16 | Wt 161.0 lb

## 2023-01-26 DIAGNOSIS — Z1159 Encounter for screening for other viral diseases: Secondary | ICD-10-CM | POA: Diagnosis not present

## 2023-01-26 DIAGNOSIS — Z1211 Encounter for screening for malignant neoplasm of colon: Secondary | ICD-10-CM

## 2023-01-26 DIAGNOSIS — E7800 Pure hypercholesterolemia, unspecified: Secondary | ICD-10-CM | POA: Diagnosis not present

## 2023-01-26 DIAGNOSIS — I10 Essential (primary) hypertension: Secondary | ICD-10-CM

## 2023-01-26 DIAGNOSIS — Z7901 Long term (current) use of anticoagulants: Secondary | ICD-10-CM | POA: Diagnosis not present

## 2023-01-26 DIAGNOSIS — Z0000 Encounter for general adult medical examination without abnormal findings: Secondary | ICD-10-CM

## 2023-01-26 DIAGNOSIS — D6861 Antiphospholipid syndrome: Secondary | ICD-10-CM

## 2023-01-26 LAB — BASIC METABOLIC PANEL
BUN: 15 mg/dL (ref 6–23)
CO2: 25 mEq/L (ref 19–32)
Calcium: 9.4 mg/dL (ref 8.4–10.5)
Chloride: 103 mEq/L (ref 96–112)
Creatinine, Ser: 0.95 mg/dL (ref 0.40–1.20)
GFR: 70.59 mL/min (ref >60.00)
Glucose, Bld: 83 mg/dL (ref 70–99)
Potassium: 4.4 mEq/L (ref 3.5–5.1)
Sodium: 136 mEq/L (ref 135–145)

## 2023-01-26 LAB — TSH: TSH: 1.29 u[IU]/mL (ref 0.35–5.50)

## 2023-01-26 LAB — POCT INR: INR: 4.6 — AB (ref 2.0–3.0)

## 2023-01-26 LAB — LIPID PANEL
Cholesterol: 206 mg/dL — ABNORMAL HIGH (ref 0–200)
HDL: 37.8 mg/dL — ABNORMAL LOW (ref >39.00)
LDL Cholesterol: 146 mg/dL — ABNORMAL HIGH (ref 0–99)
NonHDL: 168.27
Total CHOL/HDL Ratio: 5
Triglycerides: 109 mg/dL (ref 0.0–149.0)
VLDL: 21.8 mg/dL (ref 0.0–40.0)

## 2023-01-26 MED ORDER — AMLODIPINE BESYLATE 10 MG PO TABS: 10.0000 mg | ORAL_TABLET | Freq: Every day | ORAL | 0 refills | Status: DC

## 2023-01-26 NOTE — Patient Instructions (Signed)
Increase amlodipine to 10mg . Hold coumadin tonight and tomorrow night. Restart 7.5mg  on Friday evening.

## 2023-01-26 NOTE — Progress Notes (Signed)
Subjective:     Patient ID: Phyllis Hopkins, female    DOB: 1975/07/23, 47 y.o.   MRN: [id number]  Chief Complaint  Patient presents with   Hypertension    Here for follow up   Cerebrovascular Accident    H/O cva, on coumadin    HPI  Discussed the use of AI scribe software for clinical note transcription with the patient, who gave verbal consent to proceed.  History of Present Illness       Antiphospholipid syndrome-  Pt is maintained on coumadin.  She states that since her levels are often low she decided to double her dose yesterday in preparation for today's appointment.  HTN- maintained on amlodipine and labetalol.    Hx of CVA- maintained on statin, aspirin 81mg .       BP Readings from Last 3 Encounters:  01/26/23 (!) 146/90  10/04/22 115/71  09/27/22 (!) 170/107    Wt Readings from Last 3 Encounters:  01/26/23 161 lb (73 kg)  10/04/22 157 lb (71.2 kg)  09/27/22 157 lb (71.2 kg)      Health Maintenance Due  Topic Date Due   Colonoscopy  Never done   COVID-19 Vaccine (3 - 2023-24 season) 02/19/2022   PAP SMEAR-Modifier  11/06/2022   INFLUENZA VACCINE  01/20/2023    Past Medical History:  Diagnosis Date   Blood in stool    hx of   Heart murmur    Hypertension    Syncope     Past Surgical History:  Procedure Laterality Date   BUBBLE STUDY  06/25/2019   Procedure: BUBBLE STUDY;  Surgeon: Little Ishikawa, MD;  Location: [facility] ENDOSCOPY;  Service: Endoscopy;;   TEE WITHOUT CARDIOVERSION N/A 06/25/2019   Procedure: TRANSESOPHAGEAL ECHOCARDIOGRAM (TEE);  Surgeon: Little Ishikawa, MD;  Location: [facility] ENDOSCOPY;  Service: Endoscopy;  Laterality: N/A;    Family History  Problem Relation Age of Onset   Hypertension Mother    Hypertension Father    Diabetes Father        diet controlled    Social History   Socioeconomic History   Marital status: Divorced    Spouse name: Not on file   Number of children: Not on file   Years of  education: Not on file   Highest education level: Not on file  Occupational History   Occupation: respiratory therapistory therapist    Comment:  [place]  Tobacco Use   Smoking status: Never   Smokeless tobacco: Never  Substance and Sexual Activity   Alcohol use: No   Drug use: No   Sexual activity: Not on file  Other Topics Concern   Not on file  Social History Narrative   Divorced lives w/ cousin   Regular exercise; yes   Social Determinants of Health   Financial Resource Strain: Not on file  Food Insecurity: Not on file  Transportation Needs: Not on file  Physical Activity: Not on file  Stress: Not on file  Social Connections: Not on file  Intimate Partner Violence: Not on file    Outpatient Medications Prior to Visit  Medication Sig Dispense Refill   aspirin EC 81 MG EC tablet Take 1 tablet (81 mg total) by mouth daily.     atorvastatin (LIPITOR) 40 MG tablet Take 1 tablet (40 mg total) by mouth daily at 6 PM. 30 tablet 2   ferrous sulfate 325 (65 FE) MG EC tablet Take 1 tablet (325 mg total) by mouth daily with breakfast. 30  tablet 3   labetalol (NORMODYNE) 200 MG tablet Take 1 tablet (200 mg total) by mouth 2 (two) times daily. 180 tablet 0   warfarin (COUMADIN) 7.5 MG tablet TAKE AS DIRECTED BY COUMADIN CLINIC 90 tablet 0   amLODipine (NORVASC) 5 MG tablet TAKE 1 TABLET(5 MG) BY MOUTH DAILY 90 tablet 2   No facility-administered medications prior to visit.    No Known Allergies  ROS See HPI    Objective:    Physical Exam Constitutional:      General: She is not in acute distress.    Appearance: Normal appearance. She is well-developed.  HENT:     Head: Normocephalic and atraumatic.     Right Ear: External ear normal.     Left Ear: External ear normal.  Eyes:     General: No scleral icterus. Neck:     Thyroid: No thyromegaly.  Cardiovascular:     Rate and Rhythm: Normal rate and regular rhythm.     Heart sounds: Normal heart sounds. No murmur  heard. Pulmonary:     Effort: Pulmonary effort is normal. No respiratory distress.     Breath sounds: Normal breath sounds. No wheezing.  Musculoskeletal:     Cervical back: Neck supple.  Skin:    General: Skin is warm and dry.  Neurological:     Mental Status: She is alert and oriented to person, place, and time.  Psychiatric:        Mood and Affect: Mood normal.        Behavior: Behavior normal.        Thought Content: Thought content normal.        Judgment: Judgment normal.      BP (!) 146/90 (BP Location: Left Arm, Patient Position: Sitting, Cuff Size: Small)   Pulse 75   Temp 98.6 F (37 C) (Oral)   Resp 16   Wt 161 lb (73 kg)   SpO2 100%   BMI 29.45 kg/m  Wt Readings from Last 3 Encounters:  01/26/23 161 lb (73 kg)  10/04/22 157 lb (71.2 kg)  09/27/22 157 lb (71.2 kg)       Assessment & Plan:   Problem List Items Addressed This Visit       Unprioritized   Hyperlipidemia    Lab Results  Component Value Date   CHOL 206 (H) 01/26/2023   HDL 37.80 (L) 01/26/2023   LDLCALC 146 (H) 01/26/2023   TRIG 109.0 01/26/2023   CHOLHDL 5 01/26/2023   Last lipid panel above goal. ? Due to statin non-compliance.  Will repeat today. Continue statin.       Relevant Medications   amLODipine (NORVASC) 10 MG tablet   Other Relevant Orders   Lipid panel (Completed)   HTN (hypertension)    BP is uncontrolled. Will increase amlodipine from 5mg  to 10mg .       Relevant Medications   amLODipine (NORVASC) 10 MG tablet   Other Relevant Orders   Basic Metabolic Panel (BMET) (Completed)   Antiphospholipid syndrome (HCC)    Maintained on coumadin 7.5 mg daily and has hx of non-compliance.  INR very high today at 4.6.  Pt was advised not to change her medication dosing on her own and to follow up with [place] with any concerns about dosing.  Pt advised to hold Coumadin for 2 days then restart the 7.5mg  once daily. Repeat INR in 1 week.      Other Visit Diagnoses     Long term  (current)  use of anticoagulants    -  Primary   Relevant Orders   POCT INR (Completed)   Screening for colon cancer       Relevant Orders   Ambulatory referral to Gastroenterology   Encounter for hepatitis C screening test for low risk patient       Relevant Orders   Hepatitis C Antibody (Completed)   Preventative health care       Relevant Orders   TSH (Completed)       I have discontinued Kinda M. Hodo's amLODipine. I am also having her start on amLODipine. Additionally, I am having her maintain her aspirin EC, atorvastatin, ferrous sulfate, labetalol, and warfarin.  Meds ordered this encounter  Medications   amLODipine (NORVASC) 10 MG tablet    Sig: Take 1 tablet (10 mg total) by mouth daily.    Dispense:  90 tablet    Refill:  0    Order Specific Question:   Supervising Provider    Answer:   Danise Edge A [[id number]]

## 2023-01-28 NOTE — Assessment & Plan Note (Signed)
BP is uncontrolled. Will increase amlodipine from 5mg  to 10mg .

## 2023-01-28 NOTE — Assessment & Plan Note (Signed)
Lab Results  Component Value Date   CHOL 206 (H) 01/26/2023   HDL 37.80 (L) 01/26/2023   LDLCALC 146 (H) 01/26/2023   TRIG 109.0 01/26/2023   CHOLHDL 5 01/26/2023   Last lipid panel above goal. ? Due to statin non-compliance.  Will repeat today. Continue statin.

## 2023-01-28 NOTE — Assessment & Plan Note (Signed)
Maintained on coumadin 7.5 mg daily and has hx of non-compliance.  INR very high today at 4.6.  Pt was advised not to change her medication dosing on her own and to follow up with [place] with any concerns about dosing.  Pt advised to hold Coumadin for 2 days then restart the 7.5mg  once daily. Repeat INR in 1 week.

## 2023-02-01 ENCOUNTER — Ambulatory Visit: Payer: Federal, State, Local not specified - PPO | Admitting: Family

## 2023-02-01 VITALS — BP 162/104 | HR 65 | Temp 98.4°F | Resp 16 | Wt 160.0 lb

## 2023-02-01 DIAGNOSIS — E7800 Pure hypercholesterolemia, unspecified: Secondary | ICD-10-CM

## 2023-02-01 DIAGNOSIS — D6861 Antiphospholipid syndrome: Secondary | ICD-10-CM | POA: Diagnosis not present

## 2023-02-01 DIAGNOSIS — I639 Cerebral infarction, unspecified: Secondary | ICD-10-CM | POA: Diagnosis not present

## 2023-02-01 DIAGNOSIS — I10 Essential (primary) hypertension: Secondary | ICD-10-CM

## 2023-02-01 DIAGNOSIS — Z7901 Long term (current) use of anticoagulants: Secondary | ICD-10-CM | POA: Diagnosis not present

## 2023-02-01 LAB — POCT INR
INR: 3.1 — AB (ref 2.0–3.0)
POC INR: 3.1

## 2023-02-01 MED ORDER — CARVEDILOL 25 MG PO TABS: 25.0000 mg | ORAL_TABLET | Freq: Two times a day (BID) | ORAL | 3 refills | Status: DC

## 2023-02-01 NOTE — Assessment & Plan Note (Signed)
Continue on Warfrin 7.5mg  daily. INR 3.1, will not make changes to her coumadin dose as we are coming off of the supratherapeutic read last week.  Repeat INR in 2 weeks. If still >3 will adjust.

## 2023-02-01 NOTE — Assessment & Plan Note (Signed)
Last lipid panel was mildly elevated. Continue on Lipitor as scheduled.

## 2023-02-01 NOTE — Progress Notes (Unsigned)
Subjective:     Patient ID: Phyllis Hopkins, female    DOB: May 29, 1976, 47 y.o.   MRN: [id number]  Chief Complaint  Patient presents with   Hypertension    Here for follow up   Long term use anticoagulants    INR recheck today    Hypertension Pertinent negatives include no chest pain, headaches, palpitations or shortness of breath.    Discussed the use of AI scribe software for clinical note transcription with the patient, who gave verbal consent to proceed.  47 year old female presents with follow up for blood pressure and recheck INR levels. Patient states that she is taking her new dose of blood pressure medication that was changed last office week in office. She was changed from Amlodipine 5mg  to 10mg  daily along with labetalol 200mg  twice a day. She states no side effects noted with medication change. Rechecked her blood pressure in office and it was 140/102. With her Warfarin she was to hold the 7.5mg  dose for 2 days and restart back daily and her INR today was 3.1. She states that last night she went to a restaurant for dinner and ate a really salty meal and she states that is why her blood pressure is elevated today.   Lab Results  Component Value Date   CHOL 206 (H) 01/26/2023   HDL 37.80 (L) 01/26/2023   LDLCALC 146 (H) 01/26/2023   TRIG 109.0 01/26/2023   CHOLHDL 5 01/26/2023        Health Maintenance Due  Topic Date Due   Colonoscopy  Never done   COVID-19 Vaccine (3 - 2023-24 season) 02/19/2022   PAP SMEAR-Modifier  11/06/2022   INFLUENZA VACCINE  01/20/2023    Past Medical History:  Diagnosis Date   Blood in stool    hx of   Heart murmur    Hypertension    Syncope     Past Surgical History:  Procedure Laterality Date   BUBBLE STUDY  06/25/2019   Procedure: BUBBLE STUDY;  Surgeon: Little Ishikawa, MD;  Location: [facility] ENDOSCOPY;  Service: Endoscopy;;   TEE WITHOUT CARDIOVERSION N/A 06/25/2019   Procedure: TRANSESOPHAGEAL ECHOCARDIOGRAM  (TEE);  Surgeon: Little Ishikawa, MD;  Location: [facility] ENDOSCOPY;  Service: Endoscopy;  Laterality: N/A;    Family History  Problem Relation Age of Onset   Hypertension Mother    Hypertension Father    Diabetes Father        diet controlled    Social History   Socioeconomic History   Marital status: Divorced    Spouse name: Not on file   Number of children: Not on file   Years of education: Not on file   Highest education level: Not on file  Occupational History   Occupation: respiratory therapistory therapist    Comment: [place]  Tobacco Use   Smoking status: Never   Smokeless tobacco: Never  Substance and Sexual Activity   Alcohol use: No   Drug use: No   Sexual activity: Not on file  Other Topics Concern   Not on file  Social History Narrative   Divorced lives w/ cousin   Regular exercise; yes   Social Determinants of Health   Financial Resource Strain: Not on file  Food Insecurity: Not on file  Transportation Needs: Not on file  Physical Activity: Not on file  Stress: Not on file  Social Connections: Not on file  Intimate Partner Violence: Not on file    Outpatient Medications Prior to  Visit  Medication Sig Dispense Refill   amLODipine (NORVASC) 10 MG tablet Take 1 tablet (10 mg total) by mouth daily. 90 tablet 0   aspirin EC 81 MG EC tablet Take 1 tablet (81 mg total) by mouth daily.     atorvastatin (LIPITOR) 40 MG tablet Take 1 tablet (40 mg total) by mouth daily at 6 PM. 30 tablet 2   ferrous sulfate 325 (65 FE) MG EC tablet Take 1 tablet (325 mg total) by mouth daily with breakfast. 30 tablet 3   warfarin (COUMADIN) 7.5 MG tablet TAKE AS DIRECTED BY COUMADIN CLINIC 90 tablet 0   labetalol (NORMODYNE) 200 MG tablet Take 1 tablet (200 mg total) by mouth 2 (two) times daily. 180 tablet 0   No facility-administered medications prior to visit.    No Known Allergies  Review of Systems  Constitutional:  Negative for fever.  HENT:  Negative for  ear discharge, ear pain, hearing loss and sore throat.   Respiratory:  Negative for cough, shortness of breath and wheezing.   Cardiovascular:  Negative for chest pain and palpitations.  Gastrointestinal:  Negative for constipation, diarrhea, heartburn, nausea and vomiting.  Musculoskeletal:  Negative for back pain and joint pain.  Skin:  Negative for itching and rash.  Neurological:  Negative for dizziness and headaches.       Objective:    Physical Exam Constitutional:      Appearance: Normal appearance. She is normal weight.  HENT:     Head: Normocephalic.  Cardiovascular:     Rate and Rhythm: Normal rate and regular rhythm.     Pulses: Normal pulses.     Heart sounds: Normal heart sounds.  Pulmonary:     Effort: Pulmonary effort is normal.     Breath sounds: Normal breath sounds.  Skin:    General: Skin is warm.  Neurological:     General: No focal deficit present.     Mental Status: She is alert and oriented to person, place, and time. Mental status is at baseline.  Psychiatric:        Mood and Affect: Mood normal.        Behavior: Behavior normal.        Thought Content: Thought content normal.        Judgment: Judgment normal.      BP (!) 162/104 (BP Location: Right Arm, Patient Position: Sitting, Cuff Size: Small)   Pulse 65   Temp 98.4 F (36.9 C) (Oral)   Resp 16   Wt 160 lb (72.6 kg)   SpO2 100%   BMI 29.26 kg/m  Wt Readings from Last 3 Encounters:  02/01/23 160 lb (72.6 kg)  01/26/23 161 lb (73 kg)  10/04/22 157 lb (71.2 kg)       Assessment & Plan:   Problem List Items Addressed This Visit       Unprioritized   Hyperlipidemia    Last lipid panel was mildly elevated. Continue on Lipitor as scheduled.       Relevant Medications   carvedilol (COREG) 25 MG tablet   HTN (hypertension)    BP Readings from Last 3 Encounters:  02/01/23 (!) 162/104  01/26/23 (!) 146/90  10/04/22 115/71   BP actually higher today on the increased dose of  amlodipine. She is adamant that she has been compliant with her meds and dose adjustments.  Will continue amlodipine 10mg  and change labetalol to carvedilol 25mg  bid.       Relevant Medications  carvedilol (COREG) 25 MG tablet   Cerebrovascular accident (CVA) (HCC)    Continue coumadin, aspirin 81mg  and statin for secondary prevention.        Relevant Medications   carvedilol (COREG) 25 MG tablet   Antiphospholipid syndrome (HCC)    Continue on Warfrin 7.5mg  daily. INR 3.1, will not make changes to her coumadin dose as we are coming off of the supratherapeutic read last week.  Repeat INR in 2 weeks. If still >3 will adjust.      Other Visit Diagnoses     Long term (current) use of anticoagulants    -  Primary   Relevant Orders   POCT INR (Completed)       I have discontinued Karlissa M. Arch's labetalol. I am also having her start on carvedilol. Additionally, I am having her maintain her aspirin EC, atorvastatin, ferrous sulfate, warfarin, and amLODipine.  Meds ordered this encounter  Medications   carvedilol (COREG) 25 MG tablet    Sig: Take 1 tablet (25 mg total) by mouth 2 (two) times daily with a meal.    Dispense:  60 tablet    Refill:  3    Order Specific Question:   Supervising Provider    Answer:   Danise Edge A [[id number]]

## 2023-02-02 ENCOUNTER — Telehealth: Payer: Self-pay | Admitting: Family

## 2023-02-02 MED ORDER — ATORVASTATIN CALCIUM 40 MG PO TABS: 40.0000 mg | ORAL_TABLET | Freq: Every day | ORAL | 0 refills | Status: DC

## 2023-02-02 NOTE — Assessment & Plan Note (Signed)
BP Readings from Last 3 Encounters:  02/01/23 (!) 162/104  01/26/23 (!) 146/90  10/04/22 115/71   BP actually higher today on the increased dose of amlodipine. She is adamant that she has been compliant with her meds and dose adjustments.  Will continue amlodipine 10mg  and change labetalol to carvedilol 25mg  bid.

## 2023-02-02 NOTE — Telephone Encounter (Signed)
See mychart.  

## 2023-02-02 NOTE — Assessment & Plan Note (Signed)
Continue coumadin, aspirin 81mg  and statin for secondary prevention.

## 2023-02-03 ENCOUNTER — Encounter (INDEPENDENT_AMBULATORY_CARE_PROVIDER_SITE_OTHER): Payer: Self-pay

## 2023-02-25 ENCOUNTER — Ambulatory Visit (INDEPENDENT_AMBULATORY_CARE_PROVIDER_SITE_OTHER): Payer: Federal, State, Local not specified - PPO | Admitting: Family

## 2023-02-25 VITALS — BP 130/74 | HR 76 | Temp 98.8°F | Resp 16 | Ht 62.0 in | Wt 163.0 lb

## 2023-02-25 DIAGNOSIS — I10 Essential (primary) hypertension: Secondary | ICD-10-CM

## 2023-02-25 DIAGNOSIS — D6861 Antiphospholipid syndrome: Secondary | ICD-10-CM | POA: Diagnosis not present

## 2023-02-25 DIAGNOSIS — Z7901 Long term (current) use of anticoagulants: Secondary | ICD-10-CM

## 2023-02-25 LAB — POCT INR
INR: 4.8 — AB (ref 2.0–3.0)
POC INR: 4.8

## 2023-02-25 NOTE — Progress Notes (Signed)
Subjective:     Patient ID: Phyllis Hopkins, female    DOB: 1975/08/03, 47 y.o.   MRN: [id number]  Chief Complaint  Patient presents with   Hypertension    Here for follow up   Antiphospholipid syndrome    On coumadin last INR 3.1 02/01/23    Hypertension    Discussed the use of AI scribe software for clinical note transcription with the patient, who gave verbal consent to proceed.  History of Present Illness         The patient, with a history of antiphospholipid syndrome on chronic coumadin therapy and HTN presents today for follow up. She reports taking her Coumadin as prescribed, with the exception of one instance of  two missed doses about 1 week ago due to travel.   Last visit we changed her labetalol to carvedilol for improved BP control.  BP Readings from Last 3 Encounters:  02/25/23 130/74  02/01/23 (!) 162/104  01/26/23 (!) 146/90       Health Maintenance Due  Topic Date Due   Colonoscopy  Never done   PAP SMEAR-Modifier  11/06/2022   INFLUENZA VACCINE  01/20/2023   COVID-19 Vaccine (3 - 2023-24 season) 02/20/2023    Past Medical History:  Diagnosis Date   Blood in stool    hx of   Heart murmur    Hypertension    Syncope     Past Surgical History:  Procedure Laterality Date   BUBBLE STUDY  06/25/2019   Procedure: BUBBLE STUDY;  Surgeon: Little Ishikawa, MD;  Location: [facility] ENDOSCOPY;  Service: Endoscopy;;   TEE WITHOUT CARDIOVERSION N/A 06/25/2019   Procedure: TRANSESOPHAGEAL ECHOCARDIOGRAM (TEE);  Surgeon: Little Ishikawa, MD;  Location: [facility] ENDOSCOPY;  Service: Endoscopy;  Laterality: N/A;    Family History  Problem Relation Age of Onset   Hypertension Mother    Hypertension Father    Diabetes Father        diet controlled    Social History   Socioeconomic History   Marital status: Divorced    Spouse name: Not on file   Number of children: Not on file   Years of education: Not on file   Highest education level: Not  on file  Occupational History   Occupation: respiratory therapistory therapist    Comment: [place]  Tobacco Use   Smoking status: Never   Smokeless tobacco: Never  Substance and Sexual Activity   Alcohol use: No   Drug use: No   Sexual activity: Not on file  Other Topics Concern   Not on file  Social History Narrative   Divorced lives w/ cousin   Regular exercise; yes   Social Determinants of Health   Financial Resource Strain: Not on file  Food Insecurity: Not on file  Transportation Needs: Not on file  Physical Activity: Not on file  Stress: Not on file  Social Connections: Not on file  Intimate Partner Violence: Not on file    Outpatient Medications Prior to Visit  Medication Sig Dispense Refill   amLODipine (NORVASC) 10 MG tablet Take 1 tablet (10 mg total) by mouth daily. 90 tablet 0   aspirin EC 81 MG EC tablet Take 1 tablet (81 mg total) by mouth daily.     atorvastatin (LIPITOR) 40 MG tablet Take 1 tablet (40 mg total) by mouth daily at 6 PM. 90 tablet 0   carvedilol (COREG) 25 MG tablet Take 1 tablet (25 mg total) by mouth 2 (two) times  daily with a meal. 60 tablet 3   ferrous sulfate 325 (65 FE) MG EC tablet Take 1 tablet (325 mg total) by mouth daily with breakfast. 30 tablet 3   warfarin (COUMADIN) 7.5 MG tablet TAKE AS DIRECTED BY COUMADIN CLINIC 90 tablet 0   No facility-administered medications prior to visit.    No Known Allergies  ROS See HPI    Objective:    Physical Exam Constitutional:      General: She is not in acute distress.    Appearance: Normal appearance. She is well-developed.  HENT:     Head: Normocephalic and atraumatic.     Right Ear: External ear normal.     Left Ear: External ear normal.  Eyes:     General: No scleral icterus. Neck:     Thyroid: No thyromegaly.  Cardiovascular:     Rate and Rhythm: Normal rate and regular rhythm.     Heart sounds: Normal heart sounds. No murmur heard. Pulmonary:     Effort: Pulmonary  effort is normal. No respiratory distress.     Breath sounds: Normal breath sounds. No wheezing.  Musculoskeletal:     Cervical back: Neck supple.  Skin:    General: Skin is warm and dry.  Neurological:     Mental Status: She is alert and oriented to person, place, and time.  Psychiatric:        Mood and Affect: Mood normal.        Behavior: Behavior normal.        Thought Content: Thought content normal.        Judgment: Judgment normal.      BP 130/74   Pulse 76   Temp 98.8 F (37.1 C) (Oral)   Resp 16   Ht 5\' 2"  (1.575 m)   Wt 163 lb (73.9 kg)   SpO2 100%   BMI 29.81 kg/m  Wt Readings from Last 3 Encounters:  02/25/23 163 lb (73.9 kg)  02/01/23 160 lb (72.6 kg)  01/26/23 161 lb (73 kg)       Assessment & Plan:   Problem List Items Addressed This Visit       Unprioritized   HTN (hypertension)    BP now at goal. Continue current doses of amlodipine and carvedilol.       Antiphospholipid syndrome (HCC)    Supratherapeutic INR.  I think that her compliance has been patchy leading [place] to not have a good idea of her actual dosing needs.  I have urged her not to skip doses and not to double doses. Place pills in a pill tray to help remember dosing/personal accountability. Per up to date, based on today's INR (4.8!), will need to hold coumadin for 24 hours and then decrease by 10% per week. Patient is advised as follows:  Do not take any coumadin tonight. Restart coumadin tomorrow night with the following schedule.  Saturday 7.5mg  tab (1/2 tab only) Sunday   7.5mg  Monday  7.5mg  Tuesday  7.5mg  (1/2 tab only) Wednesday 7.5 mg  Thursday 7.5mg   Friday 7.5mg        Other Visit Diagnoses     Long term (current) use of anticoagulants    -  Primary   Relevant Orders   POCT INR (Completed)       I am having Phyllis Hopkins maintain her aspirin EC, ferrous sulfate, warfarin, amLODipine, carvedilol, and atorvastatin.  No orders of the defined types were placed  in this encounter.

## 2023-02-25 NOTE — Patient Instructions (Addendum)
Please call [facility] to schedule your colonoscopy- [phone]. Do not take any coumadin tonight. Restart coumadin tomorrow night with the following schedule.  Saturday 7.5mg  tab (1/2 tab only) Sunday   7.5mg  Monday  7.5mg  Tuesday  7.5mg  (1/2 tab only) Wednesday 7.5 mg  Thursday 7.5mg   Friday 7.5mg 

## 2023-02-25 NOTE — Assessment & Plan Note (Addendum)
Supratherapeutic INR.  I think that her compliance has been patchy leading [place] to not have a good idea of her actual dosing needs.  I have urged her not to skip doses and not to double doses. Place pills in a pill tray to help remember dosing/personal accountability. Per up to date, based on today's INR (4.8!), will need to hold coumadin for 24 hours and then decrease by 10% per week. Patient is advised as follows:  Do not take any coumadin tonight. Restart coumadin tomorrow night with the following schedule.  Saturday 7.5mg  tab (1/2 tab only) Sunday   7.5mg  Monday  7.5mg  Tuesday  7.5mg  (1/2 tab only) Wednesday 7.5 mg  Thursday 7.5mg   Friday 7.5mg 

## 2023-02-25 NOTE — Assessment & Plan Note (Signed)
BP now at goal. Continue current doses of amlodipine and carvedilol.

## 2023-03-09 ENCOUNTER — Ambulatory Visit (INDEPENDENT_AMBULATORY_CARE_PROVIDER_SITE_OTHER): Payer: Federal, State, Local not specified - PPO

## 2023-03-09 ENCOUNTER — Other Ambulatory Visit (INDEPENDENT_AMBULATORY_CARE_PROVIDER_SITE_OTHER): Payer: Federal, State, Local not specified - PPO

## 2023-03-09 ENCOUNTER — Telehealth: Payer: Self-pay | Admitting: Family

## 2023-03-09 DIAGNOSIS — Z7901 Long term (current) use of anticoagulants: Secondary | ICD-10-CM | POA: Diagnosis not present

## 2023-03-09 DIAGNOSIS — D75839 Thrombocytosis, unspecified: Secondary | ICD-10-CM

## 2023-03-09 DIAGNOSIS — T148XXA Other injury of unspecified body region, initial encounter: Secondary | ICD-10-CM | POA: Diagnosis not present

## 2023-03-09 DIAGNOSIS — D649 Anemia, unspecified: Secondary | ICD-10-CM

## 2023-03-09 LAB — POCT INR: INR: 2 (ref 2.0–3.0)

## 2023-03-09 LAB — CBC WITH DIFFERENTIAL/PLATELET
Basophils Absolute: 0 10*3/uL (ref 0.0–0.1)
Basophils Relative: 0.4 % (ref 0.0–3.0)
Eosinophils Absolute: 0.1 10*3/uL (ref 0.0–0.7)
Eosinophils Relative: 0.9 % (ref 0.0–5.0)
HCT: 34.3 % — ABNORMAL LOW (ref 36.0–46.0)
Hemoglobin: 11.3 g/dL — ABNORMAL LOW (ref 12.0–15.0)
Lymphocytes Relative: 36.5 % (ref 12.0–46.0)
Lymphs Abs: 2.1 10*3/uL (ref 0.7–4.0)
MCHC: 32.9 g/dL (ref 30.0–36.0)
MCV: 90.9 fl (ref 78.0–100.0)
Monocytes Absolute: 0.6 10*3/uL (ref 0.1–1.0)
Monocytes Relative: 10.2 % (ref 3.0–12.0)
Neutro Abs: 3 10*3/uL (ref 1.4–7.7)
Neutrophils Relative %: 52 % (ref 43.0–77.0)
Platelets: 673 10*3/uL — ABNORMAL HIGH (ref 150.0–400.0)
RBC: 3.77 Mil/uL — ABNORMAL LOW (ref 3.87–5.11)
RDW: 15.3 % (ref 11.5–15.5)
WBC: 5.8 10*3/uL (ref 4.0–10.5)

## 2023-03-09 NOTE — Progress Notes (Signed)
Pt here for INR check per PCP  Goal INR = 2.0-3.0  Last INR = 4.8  Pt currently takes Coumadin   Pt denies recent antibiotics, no dietary changes and *some unusual bruising / bleeding. Pt was sent to lab to get a CBC done due to unusual bruising.   INR today = 2.0  Pt advised per PCP no change in coumadin and re-check in 3 weeks, scheduled pt an appointment 03/29/2023.

## 2023-03-09 NOTE — Telephone Encounter (Signed)
She is mildly anemic and her platelets are quite elevated.  I would like her to complete an ifob and follow back up with hematology.    Please also give her the number to [facility] to schedule her colonoscopy.

## 2023-03-10 ENCOUNTER — Encounter: Payer: Self-pay | Admitting: Nurse Practitioner

## 2023-03-10 NOTE — Telephone Encounter (Signed)
Patient notified of results, provider's comments and recommendations. She said she will pick up IFOB kit today and she will also call GI.

## 2023-03-11 ENCOUNTER — Telehealth: Payer: Self-pay | Admitting: Hematology and Oncology

## 2023-03-16 ENCOUNTER — Other Ambulatory Visit: Payer: Federal, State, Local not specified - PPO

## 2023-03-16 ENCOUNTER — Ambulatory Visit: Payer: Federal, State, Local not specified - PPO | Admitting: Hematology and Oncology

## 2023-03-16 NOTE — Telephone Encounter (Signed)
Pt said her mom Sydell Axon will be coming to get the kit for her since she's at work

## 2023-03-17 ENCOUNTER — Other Ambulatory Visit (INDEPENDENT_AMBULATORY_CARE_PROVIDER_SITE_OTHER): Payer: Federal, State, Local not specified - PPO

## 2023-03-17 DIAGNOSIS — D649 Anemia, unspecified: Secondary | ICD-10-CM

## 2023-03-17 NOTE — Progress Notes (Signed)
Specimen drop off

## 2023-03-18 ENCOUNTER — Telehealth: Payer: Self-pay | Admitting: Family

## 2023-03-18 ENCOUNTER — Telehealth: Payer: Self-pay

## 2023-03-18 DIAGNOSIS — R195 Other fecal abnormalities: Secondary | ICD-10-CM | POA: Insufficient documentation

## 2023-03-18 LAB — FECAL OCCULT BLOOD, IMMUNOCHEMICAL: Fecal Occult Bld: POSITIVE — AB

## 2023-03-18 NOTE — Telephone Encounter (Signed)
Please advise patient that her stool is positive for blood. I would like her to see GI for this.  They will likely set her up for colonoscopy/endoscopy at a later date.

## 2023-03-18 NOTE — Telephone Encounter (Signed)
CRITICAL VALUE STICKER  CRITICAL VALUE: Positive I-FOB  MESSENGER (representative from lab): Tacey Ruiz

## 2023-03-18 NOTE — Telephone Encounter (Signed)
Patient notified of results and she is already scheduled to see GI in December

## 2023-03-28 ENCOUNTER — Inpatient Hospital Stay: Payer: Federal, State, Local not specified - PPO | Attending: Hematology and Oncology

## 2023-03-28 ENCOUNTER — Inpatient Hospital Stay: Payer: Federal, State, Local not specified - PPO | Admitting: Hematology and Oncology

## 2023-03-28 ENCOUNTER — Other Ambulatory Visit: Payer: Self-pay | Admitting: Hematology and Oncology

## 2023-03-28 VITALS — BP 138/99 | HR 65 | Temp 98.9°F | Resp 16 | Ht 62.0 in | Wt 164.1 lb

## 2023-03-28 DIAGNOSIS — D6861 Antiphospholipid syndrome: Secondary | ICD-10-CM | POA: Diagnosis not present

## 2023-03-28 DIAGNOSIS — D500 Iron deficiency anemia secondary to blood loss (chronic): Secondary | ICD-10-CM

## 2023-03-28 DIAGNOSIS — Z7901 Long term (current) use of anticoagulants: Secondary | ICD-10-CM | POA: Diagnosis not present

## 2023-03-28 DIAGNOSIS — R760 Raised antibody titer: Secondary | ICD-10-CM

## 2023-03-28 DIAGNOSIS — N920 Excessive and frequent menstruation with regular cycle: Secondary | ICD-10-CM | POA: Diagnosis not present

## 2023-03-28 LAB — CMP (CANCER CENTER ONLY)
ALT: 8 U/L (ref 0–44)
AST: 12 U/L — ABNORMAL LOW (ref 15–41)
Albumin: 4.4 g/dL (ref 3.5–5.0)
Alkaline Phosphatase: 49 U/L (ref 38–126)
Anion gap: 8 (ref 5–15)
BUN: 14 mg/dL (ref 6–20)
CO2: 23 mmol/L (ref 22–32)
Calcium: 9.5 mg/dL (ref 8.9–10.3)
Chloride: 108 mmol/L (ref 98–111)
Creatinine: 0.95 mg/dL (ref 0.44–1.00)
GFR, Estimated: >60 mL/min (ref >60)
Glucose, Bld: 82 mg/dL (ref 70–99)
Potassium: 3.8 mmol/L (ref 3.5–5.1)
Sodium: 139 mmol/L (ref 135–145)
Total Bilirubin: 0.5 mg/dL (ref 0.3–1.2)
Total Protein: 8 g/dL (ref 6.5–8.1)

## 2023-03-28 LAB — CBC WITH DIFFERENTIAL (CANCER CENTER ONLY)
Abs Immature Granulocytes: 0 10*3/uL (ref 0.00–0.07)
Basophils Absolute: 0 10*3/uL (ref 0.0–0.1)
Basophils Relative: 0 %
Eosinophils Absolute: 0.1 10*3/uL (ref 0.0–0.5)
Eosinophils Relative: 1 %
HCT: 38 % (ref 36.0–46.0)
Hemoglobin: 12.3 g/dL (ref 12.0–15.0)
Immature Granulocytes: 0 %
Lymphocytes Relative: 38 %
Lymphs Abs: 1.9 10*3/uL (ref 0.7–4.0)
MCH: 29.5 pg (ref 26.0–34.0)
MCHC: 32.4 g/dL (ref 30.0–36.0)
MCV: 91.1 fL (ref 80.0–100.0)
Monocytes Absolute: 0.5 10*3/uL (ref 0.1–1.0)
Monocytes Relative: 9 %
Neutro Abs: 2.6 10*3/uL (ref 1.7–7.7)
Neutrophils Relative %: 52 %
Platelet Count: 595 10*3/uL — ABNORMAL HIGH (ref 150–400)
RBC: 4.17 MIL/uL (ref 3.87–5.11)
RDW: 14.8 % (ref 11.5–15.5)
WBC Count: 5 10*3/uL (ref 4.0–10.5)
nRBC: 0 % (ref 0.0–0.2)

## 2023-03-28 LAB — IRON AND IRON BINDING CAPACITY (CC-WL,HP ONLY)
Iron: 64 ug/dL (ref 28–170)
Saturation Ratios: 18 % (ref 10.4–31.8)
TIBC: 347 ug/dL (ref 250–450)
UIBC: 283 ug/dL (ref 148–442)

## 2023-03-28 LAB — RETIC PANEL
Immature Retic Fract: 8.8 % (ref 2.3–15.9)
RBC.: 4.12 MIL/uL (ref 3.87–5.11)
Retic Count, Absolute: 58.5 10*3/uL (ref 19.0–186.0)
Retic Ct Pct: 1.4 % (ref 0.4–3.1)
Reticulocyte Hemoglobin: 33.3 pg (ref >27.9)

## 2023-03-28 NOTE — Progress Notes (Signed)
[facility] Telephone:([phone]) [phone]   Fax:([phone]) [phone]  PROGRESS NOTE  Patient Care Team: [name], NP as PCP - General (Internal Medicine)  Hematological/Oncological History # Positive APS Antibodies in Setting of CVA 1) 06/20/2019: Left MCA stroke. Started on aspirin therapy 81mg  daily.   2) 06/21/2019: Beta-2-glycoprotein IgG 65, Anticardiolipin IgG 66 3) 10/04/2019: as part of neurology workup, patient underwent APS testing. Found to have Anticardiolipin IgG elevated at 79, Beta 2 Glycoprotein IgG elevated at 88.  4) 11/14/2019: establish care with Dr. Leonides Schanz    Interval History:  Phyllis Hopkins 47 y.o. female with medical history significant for antiphospholipid antibody syndrome with CVA presents for a follow up visit. The patient's last visit was on 02/23/2022. In the interim since the last visit the patient has been continued on systemic anticoagulation with coumadin therapy.   On exam today Phyllis Hopkins notes she has been well overall in the room since her last visit.  She reports that she unfortunately recently was found to have occult blood in her stool and there are plans for an upcoming colonoscopy.  It is not yet scheduled.  She reports that her menstrual bleeding is been "not too bad".  She does have some occasional gum bleeding but no nosebleeds or overt blood in the urine or stool.  She reports that sometimes the stool can be a little dark with the iron pills.  The iron pills not causing any stomach upset or constipation.  She reports that her energy today is about a 7 out of 10.  She continues to have stable levels of INR with her Coumadin and has a check scheduled for tomorrow.  She reports that she does have some occasional missed doses of her iron and Coumadin.  She notes he is also doing her best to try to eat better and eat at home.  Her weight has been stable and otherwise she is at her baseline level of health.  She is not having any signs or  symptoms concerning for a recurrent clot/stroke.  Overall she feels well and has no questions comments or concerns today.  She currently denies any fevers, chills, sweats, nausea, vomiting or diarrhea.  A full 10 point ROS is listed below  MEDICAL HISTORY:  Past Medical History:  Diagnosis Date   Blood in stool    hx of   Heart murmur    Hypertension    Syncope     SURGICAL HISTORY: Past Surgical History:  Procedure Laterality Date   BUBBLE STUDY  06/25/2019   Procedure: BUBBLE STUDY;  Surgeon: Little Ishikawa, MD;  Location: [facility] ENDOSCOPY;  Service: Endoscopy;;   TEE WITHOUT CARDIOVERSION N/A 06/25/2019   Procedure: TRANSESOPHAGEAL ECHOCARDIOGRAM (TEE);  Surgeon: Little Ishikawa, MD;  Location: [facility] ENDOSCOPY;  Service: Endoscopy;  Laterality: N/A;    SOCIAL HISTORY: Social History   Socioeconomic History   Marital status: Divorced    Spouse name: Not on file   Number of children: Not on file   Years of education: Not on file   Highest education level: Not on file  Occupational History   Occupation: respiratory therapistory therapist    Comment: [place]  Tobacco Use   Smoking status: Never   Smokeless tobacco: Never  Substance and Sexual Activity   Alcohol use: No   Drug use: No   Sexual activity: Not on file  Other Topics Concern   Not on file  Social History Narrative   Divorced lives w/ cousin  Regular exercise; yes   Social Determinants of Health   Financial Resource Strain: Not on file  Food Insecurity: Not on file  Transportation Needs: Not on file  Physical Activity: Not on file  Stress: Not on file  Social Connections: Not on file  Intimate Partner Violence: Not on file    FAMILY HISTORY: Family History  Problem Relation Age of Onset   Hypertension Mother    Hypertension Father    Diabetes Father        diet controlled    ALLERGIES:  has No Known Allergies.  MEDICATIONS:  Current Outpatient Medications  Medication Sig Dispense  Refill   amLODipine (NORVASC) 10 MG tablet Take 1 tablet (10 mg total) by mouth daily. 90 tablet 0   aspirin EC 81 MG EC tablet Take 1 tablet (81 mg total) by mouth daily.     atorvastatin (LIPITOR) 40 MG tablet Take 1 tablet (40 mg total) by mouth daily at 6 PM. 90 tablet 0   carvedilol (COREG) 25 MG tablet Take 1 tablet (25 mg total) by mouth 2 (two) times daily with a meal. 60 tablet 3   ferrous sulfate 325 (65 FE) MG EC tablet Take 1 tablet (325 mg total) by mouth daily with breakfast. 30 tablet 3   warfarin (COUMADIN) 7.5 MG tablet TAKE AS DIRECTED BY COUMADIN CLINIC 90 tablet 0   No current facility-administered medications for this visit.    REVIEW OF SYSTEMS:   Constitutional: ( - ) fevers, ( - )  chills , ( - ) night sweats Eyes: ( - ) blurriness of vision, ( - ) double vision, ( - ) watery eyes Ears, nose, mouth, throat, and face: ( - ) mucositis, ( - ) sore throat Respiratory: ( - ) cough, ( - ) dyspnea, ( - ) wheezes Cardiovascular: ( - ) palpitation, ( - ) chest discomfort, ( - ) lower extremity swelling Gastrointestinal:  ( - ) nausea, ( - ) heartburn, ( - ) change in bowel habits Skin: ( - ) abnormal skin rashes Lymphatics: ( - ) new lymphadenopathy, ( - ) easy bruising Neurological: ( - ) numbness, ( - ) tingling, ( - ) new weaknesses Behavioral/Psych: ( - ) mood change, ( - ) new changes  All other systems were reviewed with the patient and are negative.  PHYSICAL EXAMINATION: ECOG PERFORMANCE STATUS: 0 - Asymptomatic  Vitals:   03/28/23 1520 03/28/23 1528  BP: (!) 143/96 (!) 138/99  Pulse: 65   Resp: 16   Temp: 98.9 F (37.2 C)   SpO2: 95%     Filed Weights   03/28/23 1520  Weight: 164 lb 1.6 oz (74.4 kg)      GENERAL: well appearing middle aged [place] American female, alert, no distress and comfortable SKIN: skin color, texture, turgor are normal, no rashes or significant lesions EYES: conjunctiva are pink and non-injected, sclera clear LUNGS: clear  to auscultation and percussion with normal breathing effort HEART: regular rate & rhythm and no murmurs and no lower extremity edema Musculoskeletal: no cyanosis of digits and no clubbing  PSYCH: alert & oriented x 3, fluent speech NEURO: no focal motor/sensory deficits  LABORATORY DATA:  I have reviewed the data as listed    Latest Ref Rng & Units 03/28/2023    2:57 PM 03/09/2023   11:53 AM 09/13/2022   11:04 AM  CBC  WBC 4.0 - 10.5 K/uL 5.0  5.8  3.7   Hemoglobin 12.0 - 15.0 g/dL 12.3  11.3  12.8   Hematocrit 36.0 - 46.0 % 38.0  34.3  38.0   Platelets 150 - 400 K/uL 595  673.0  539        Latest Ref Rng & Units 03/28/2023    2:57 PM 01/26/2023   12:32 PM 10/04/2022   12:15 PM  CMP  Glucose 70 - 99 mg/dL 82  83  161   BUN 6 - 20 mg/dL 14  15  12    Creatinine 0.44 - 1.00 mg/dL 0.96  0.45  4.09   Sodium 135 - 145 mmol/L 139  136  139   Potassium 3.5 - 5.1 mmol/L 3.8  4.4  3.8   Chloride 98 - 111 mmol/L 108  103  106   CO2 22 - 32 mmol/L 23  25  24    Calcium 8.9 - 10.3 mg/dL 9.5  9.4  9.1   Total Protein 6.5 - 8.1 g/dL 8.0   7.2   Total Bilirubin 0.3 - 1.2 mg/dL 0.5   0.3   Alkaline Phos 38 - 126 U/L 49   48   AST 15 - 41 U/L 12   12   ALT 0 - 44 U/L 8   9     RADIOGRAPHIC STUDIES: No results found.  ASSESSMENT & PLAN Phyllis Hopkins 47 y.o. female with medical history significant for antiphospholipid antibody syndrome with CVA presents for a follow up visit.  Her review of the labs, discussion with the patient her findings are most consistent with antiphospholipid antibody syndrome as well as an unrelated iron deficiency anemia secondary to GYN blood loss.    She has continued on coumadin therapy and has tolerated it well.  Additionally her hemoglobin has improved from prior.  Her serum iron levels do appear to be improving at her last visit.  Our plan will be to have the patient continue to follow with the Coumadin clinic as well as return to our clinic in 6 months time  in order to assure that she has stable iron levels.  # Positive APS Antibodies in Setting of CVA # Antiphospholipid Antibody Syndrome --Patient currently meets clinical/laboratory criteria and the diagnosis of APS was confirmed with repeat testing of antibodies in 12 weeks time. --after CVA with APS it is recommended that patients be on lifelong therapy with coumadin. DOACs are inferior to coumadin in the setting of APS --continue with coumadin clinic through her primary care. Unfortunately we do no offer coumadin clinic services at the [facility]. She currently follows with another coumadin clinic.  --RTC in 6 months to assure patient is stable on coumadin and to assess her iron deficiency anemia.    #Iron Deficiency Anemia 2/2 to Blood Loss --previously reportedheavy menstrual cycles as cause of blood loss/iron deficiency --repeat iron panel for the patient today --Labs today show white blood cell count 5.0, hemoglobin 12.3, MCV 91.1, and platelets of 595 --continue ferrous sulfate 325mg  PO daily with a source of vitamin C --if unable to maintain Hgb with PO therapy will consider IV iron therapy instead.  --continue to monitor  No orders of the defined types were placed in this encounter.   All questions were answered. The patient knows to call the clinic with any problems, questions or concerns.  A total of more than 30 minutes were spent on this encounter and over half of that time was spent on counseling and coordination of care as outlined above.   Ulysees Barns, MD  Department of Hematology/Oncology [facility] Phone: [phone] Pager: [phone] Email: [email or web address]  03/28/2023 5:07 PM

## 2023-03-29 ENCOUNTER — Ambulatory Visit (INDEPENDENT_AMBULATORY_CARE_PROVIDER_SITE_OTHER): Payer: Federal, State, Local not specified - PPO

## 2023-03-29 DIAGNOSIS — Z7901 Long term (current) use of anticoagulants: Secondary | ICD-10-CM | POA: Diagnosis not present

## 2023-03-29 LAB — POCT INR: INR: 3.9 — AB (ref 2.0–3.0)

## 2023-03-29 LAB — FERRITIN: Ferritin: 38 ng/mL (ref 11–307)

## 2023-03-29 NOTE — Progress Notes (Signed)
Pt here for INR check per [name], NP  Goal INR = 2.0- 3.0  Last INR = 2.0  Pt currently takes Coumadin: Saturday 7.5mg  tab (1/2 tab only) Sunday   7.5mg  Monday  7.5mg  Tuesday  7.5mg  (1/2 tab only) Wednesday 7.5 mg  Thursday 7.5mg   Friday 7.5mg   Pt denies recent antibiotics, no dietary changes and no unusual bruising / bleeding.  INR today = 3.9, takes meds at night.  Pt admit that she probably took a whole tab this past Saturday instead of a 1/2.    Per [name]: "Let's hold coumadin tonight.  Then resume same dosing as before. Repeat INR in 10 days." Pt aware and voices understanding. - she will check her schedule and then call back to set up her 10 day NV.

## 2023-03-30 ENCOUNTER — Other Ambulatory Visit: Payer: Self-pay | Admitting: Family

## 2023-04-13 ENCOUNTER — Ambulatory Visit: Payer: Federal, State, Local not specified - PPO | Admitting: Family

## 2023-04-15 ENCOUNTER — Ambulatory Visit (INDEPENDENT_AMBULATORY_CARE_PROVIDER_SITE_OTHER): Payer: Federal, State, Local not specified - PPO | Admitting: Neurology

## 2023-04-15 DIAGNOSIS — Z7901 Long term (current) use of anticoagulants: Secondary | ICD-10-CM

## 2023-04-15 LAB — POCT INR: POC INR: 1.8

## 2023-04-15 NOTE — Patient Instructions (Addendum)
Take Coumadin as follows:   Saturday 7.5mg  tab (1/2 tab only) Sunday   7.5mg  Monday  7.5mg  Tuesday  7.5mg  (1/2 tab only) Wednesday 7.5 mg  Thursday 7.5mg   Friday 7.5mg   Come back in one week.

## 2023-04-15 NOTE — Progress Notes (Signed)
Pt here for INR check per [name], NP   Goal INR = 2.0- 3.0   Last INR = 3.9   Per [name]: "Let's hold coumadin tonight.  Then resume same dosing as before. Repeat INR in 10 days." Pt aware and voices understanding. - she will check her schedule and then call back to set up her 10 day NV.    Pt currently takes Coumadin: Saturday 7.5mg  tab (1/2 tab only) Sunday   7.5mg  Monday  7.5mg  Tuesday  7.5mg  (1/2 tab only) Wednesday 7.5 mg  Thursday 7.5mg   Friday 7.5mg    Pt denies recent antibiotics, no dietary changes. Patient states she was on her menstrual cycle for two weeks and missed two doses of medication in the past week (Friday and Saturday)   INR today = 1.8, takes meds at night.   Spoke with Melissa who advised to take Coumadin as prescribed and return in one week for INR check. If patient continues with heavy or long menstrual bleeding, let us know and we can send a referral to GYN. Patient expressed understanding.

## 2023-04-21 ENCOUNTER — Ambulatory Visit (INDEPENDENT_AMBULATORY_CARE_PROVIDER_SITE_OTHER): Payer: Federal, State, Local not specified - PPO

## 2023-04-21 DIAGNOSIS — Z7901 Long term (current) use of anticoagulants: Secondary | ICD-10-CM | POA: Diagnosis not present

## 2023-04-21 LAB — POCT INR
INR: 4.1 — AB (ref 2.0–3.0)
POC INR: 4.1

## 2023-04-21 NOTE — Progress Notes (Signed)
Pt here for INR check per [name], NP  Goal INR = 2.0- 3.0  Last INR = 3.9  Per [name]: "Let's hold coumadin tonight. Then resume same dosing as before. Repeat INR in 10 days." Pt aware and voices understanding. - she will check her schedule and then call back to set up her 10 day NV.   Patient states she was on her menstrual cycle for two weeks and missed two doses of medication in the past week (Friday and Saturday)  INR today = 1.8, takes meds at night.

## 2023-04-21 NOTE — Patient Instructions (Signed)
Take coumadin 7.5 mg Monday Wednesday Friday  Sunday Take coumadin 3.75 mg (1/2 tablet) Tuesday Thursday Saturday

## 2023-04-21 NOTE — Progress Notes (Signed)
Pt here for INR check per  Goal INR = 2.0 to 3.0  Last INR =1.8  Patient reported at last nurse visit she had skipped a 7.5 dose due to menorrhagia.  Was advised to go to her regular dose of 7.5 for 5 days a 3.75 for 2 days.   Pt denies recent antibiotics, no dietary changes and no unusual bruising / bleeding.  Taking 7.5 Monday, Wednesday , Thursdays  and Friday and Sunday and 3.75 on Tuesdays and Saturdays  INR today = 4.1  Pt advised per Dr. Abner Greenspan to start taking 3.75 3 times a week which will be Tuesday, Thursday and Saturday. 7.5 all other days.  This instructions printed out for patient. She will need to follow up in one week for recheck.

## 2023-04-21 NOTE — Progress Notes (Deleted)
   New Patient Office Visit  Subjective    Patient ID: Phyllis Hopkins, female    DOB: 23-Apr-1976  Age: 47 y.o. MRN: [id number]  CC: No chief complaint on file.   HPI Phyllis Hopkins presents to establish care ***  Outpatient Encounter Medications as of 04/21/2023  Medication Sig   amLODipine (NORVASC) 10 MG tablet Take 1 tablet (10 mg total) by mouth daily.   aspirin EC 81 MG EC tablet Take 1 tablet (81 mg total) by mouth daily.   atorvastatin (LIPITOR) 40 MG tablet Take 1 tablet (40 mg total) by mouth daily at 6 PM.   carvedilol (COREG) 25 MG tablet Take 1 tablet (25 mg total) by mouth 2 (two) times daily with a meal.   ferrous sulfate 325 (65 FE) MG EC tablet Take 1 tablet (325 mg total) by mouth daily with breakfast.   warfarin (COUMADIN) 7.5 MG tablet TAKE AS DIRECTED BY COUMADIN CLINIC   No facility-administered encounter medications on file as of 04/21/2023.    Past Medical History:  Diagnosis Date   Blood in stool    hx of   Heart murmur    Hypertension    Syncope     Past Surgical History:  Procedure Laterality Date   BUBBLE STUDY  06/25/2019   Procedure: BUBBLE STUDY;  Surgeon: Little Ishikawa, MD;  Location: [facility] ENDOSCOPY;  Service: Endoscopy;;   TEE WITHOUT CARDIOVERSION N/A 06/25/2019   Procedure: TRANSESOPHAGEAL ECHOCARDIOGRAM (TEE);  Surgeon: Little Ishikawa, MD;  Location: [facility] ENDOSCOPY;  Service: Endoscopy;  Laterality: N/A;    Family History  Problem Relation Age of Onset   Hypertension Mother    Hypertension Father    Diabetes Father        diet controlled    Social History   Socioeconomic History   Marital status: Divorced    Spouse name: Not on file   Number of children: Not on file   Years of education: Not on file   Highest education level: Not on file  Occupational History   Occupation: respiratory therapistory therapist    Comment: [place]  Tobacco Use   Smoking status: Never   Smokeless tobacco: Never   Substance and Sexual Activity   Alcohol use: No   Drug use: No   Sexual activity: Not on file  Other Topics Concern   Not on file  Social History Narrative   Divorced lives w/ cousin   Regular exercise; yes   Social Determinants of Health   Financial Resource Strain: Not on file  Food Insecurity: Not on file  Transportation Needs: Not on file  Physical Activity: Not on file  Stress: Not on file  Social Connections: Not on file  Intimate Partner Violence: Not on file    ROS      Objective    There were no vitals taken for this visit.  Physical Exam  {Labs (Optional):23779}    Assessment & Plan:   Problem List Items Addressed This Visit   None   No follow-ups on file.   Wilford Corner, CMA

## 2023-05-04 ENCOUNTER — Ambulatory Visit (INDEPENDENT_AMBULATORY_CARE_PROVIDER_SITE_OTHER): Payer: Federal, State, Local not specified - PPO

## 2023-05-04 DIAGNOSIS — Z7901 Long term (current) use of anticoagulants: Secondary | ICD-10-CM | POA: Diagnosis not present

## 2023-05-04 LAB — POCT INR: POC INR: 1.5

## 2023-05-04 MED ORDER — AMLODIPINE BESYLATE 10 MG PO TABS: 10.0000 mg | ORAL_TABLET | Freq: Every day | ORAL | 0 refills | Status: DC

## 2023-05-04 NOTE — Progress Notes (Signed)
Pt here for INR check per  Goal INR = 2.0 to 3.0  Last INR = 4.1  Pt currently takes Coumadin 3.75 3 times a week which will be Tuesday, Thursday and Saturday. 7.5 all other days.   Pt denies recent antibiotics, no dietary changes and no unusual bruising / bleeding.  INR today = 1.5  Pt advised per PCP to do 3.75 2 times a week Tuesday and Thursday and all other days at 7.5. Follow up in one week, scheduled pt an appointment 05/11/2023.

## 2023-05-11 ENCOUNTER — Ambulatory Visit (INDEPENDENT_AMBULATORY_CARE_PROVIDER_SITE_OTHER): Payer: Federal, State, Local not specified - PPO

## 2023-05-11 DIAGNOSIS — Z7901 Long term (current) use of anticoagulants: Secondary | ICD-10-CM

## 2023-05-11 LAB — POCT INR: INR: 2.2 (ref 2.0–3.0)

## 2023-05-11 NOTE — Progress Notes (Signed)
Pt here for INR check per [facility]  Goal INR = 2.0 to 3.0  Last INR = 1.5  Pt currently takes Coumadin  3.75 2 times a week Tuesday and Thursday and all other days at 7.5mg .   Pt denies recent antibiotics, no dietary changes and no unusual bruising / bleeding.  INR today = 2.2  Pt advised per [place] no changes. Follow up Melissa. Per last note from [facility] pt needs a CPE w/ pap. Pt scheduled for next week.

## 2023-05-18 ENCOUNTER — Encounter: Payer: Federal, State, Local not specified - PPO | Admitting: Family

## 2023-05-18 ENCOUNTER — Ambulatory Visit (INDEPENDENT_AMBULATORY_CARE_PROVIDER_SITE_OTHER): Payer: Federal, State, Local not specified - PPO | Admitting: Family

## 2023-05-18 ENCOUNTER — Telehealth: Payer: Self-pay

## 2023-05-18 ENCOUNTER — Encounter: Payer: Self-pay | Admitting: Family

## 2023-05-18 VITALS — BP 130/83 | HR 64 | Temp 99.0°F | Ht 62.0 in | Wt 167.8 lb

## 2023-05-18 DIAGNOSIS — Z0000 Encounter for general adult medical examination without abnormal findings: Secondary | ICD-10-CM

## 2023-05-18 DIAGNOSIS — D6861 Antiphospholipid syndrome: Secondary | ICD-10-CM | POA: Diagnosis not present

## 2023-05-18 DIAGNOSIS — E7800 Pure hypercholesterolemia, unspecified: Secondary | ICD-10-CM | POA: Diagnosis not present

## 2023-05-18 DIAGNOSIS — D509 Iron deficiency anemia, unspecified: Secondary | ICD-10-CM

## 2023-05-18 DIAGNOSIS — Z1231 Encounter for screening mammogram for malignant neoplasm of breast: Secondary | ICD-10-CM

## 2023-05-18 LAB — LIPID PANEL
Cholesterol: 186 mg/dL (ref 0–200)
HDL: 39.2 mg/dL (ref >39.00)
LDL Cholesterol: 136 mg/dL — ABNORMAL HIGH (ref 0–99)
NonHDL: 146.81
Total CHOL/HDL Ratio: 5
Triglycerides: 54 mg/dL (ref 0.0–149.0)
VLDL: 10.8 mg/dL (ref 0.0–40.0)

## 2023-05-18 LAB — PROTIME-INR
INR: 6.2 {ratio} (ref 0.8–1.0)
Prothrombin Time: 60.3 s (ref 9.6–13.1)

## 2023-05-18 NOTE — Assessment & Plan Note (Signed)
  Causing constipation. -Continue iron supplementation. -Continue current strategies to manage constipation (apple juice, water).

## 2023-05-18 NOTE — Assessment & Plan Note (Signed)
-  tetanus and flu shot up to date -Consider COVID booster at pharmacy. -Discuss shingles shot after upcoming birthday. -Continue healthy diet and exercise regimen. -Consider Pap smear next year. -schedule vision/dental visit -schedule mammogram -colo to be scheduled by GI due to hx of Heme positive stool.

## 2023-05-18 NOTE — Progress Notes (Signed)
Subjective:     Patient ID: Phyllis Hopkins, female    DOB: [birth year], 47 y.o.   MRN: [id number]  Chief Complaint  Patient presents with   Annual Exam    HPI  Discussed the use of AI scribe software for clinical note transcription with the patient, who gave verbal consent to proceed.  History of Present Illness   The patient, with a history of hyperlipidemia, presents for an annual physical. She reports adherence to her current medication regimen, including Lipitor for hyperlipidemia. She has an upcoming appointment with gastroenterology for a colonoscopy due to a previous positive fecal occult blood test. She has not noticed any overt blood in her stool, but notes that her stools are dark due to iron supplementation. She also reports occasional constipation, which she manages with apple juice and increased water intake.  The patient acknowledges a need to improve her diet, specifically reducing sugar intake, and increase her exercise regimen. She currently walks her dog five days a week, but acknowledges that this does not provide sustained cardiovascular exercise. She is not currently sexually active, but uses condoms or Plan B for contraception when she is.  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 She lives alone with her dog and cat. She denies any current cough or cold symptoms, skin rashes or moles of concern, swelling in her legs, hearing concerns, and any current concerns about depression or anxiety. She does report a need for updated vision and dental checks.      Immunizations: up to date except covid booster Diet: healthy- likes vegetables Exercise: some- busy at work Colonoscopy: scheduled Pap Smear: next year Mammogram: due Vision: due Dental: due     Health Maintenance Due  Topic Date Due   Colonoscopy  Never done    Past Medical History:  Diagnosis Date   Blood in stool     hx of   Cerebrovascular accident (CVA) (HCC) 06/20/2019   Heart murmur    Hypertension    Syncope     Past Surgical History:  Procedure Laterality Date   BUBBLE STUDY  06/25/2019   Procedure: BUBBLE STUDY;  Surgeon: Little Ishikawa, MD;  Location: [facility] ENDOSCOPY;  Service: Endoscopy;;   TEE WITHOUT CARDIOVERSION N/A 06/25/2019   Procedure: TRANSESOPHAGEAL ECHOCARDIOGRAM (TEE);  Surgeon: Little Ishikawa, MD;  Location: [facility] ENDOSCOPY;  Service: Endoscopy;  Laterality: N/A;    Family History  Problem Relation Age of Onset   Hypertension Mother    Hypertension Father    Diabetes Father        diet controlled    Social History   Socioeconomic History   Marital status: Divorced    Spouse name: Not on file   Number of children: Not on file   Years of education: Not on file   Highest education level: Not on file  Occupational History   Occupation: respiratory therapistory therapist    Comment: [place]  Tobacco Use   Smoking status: Unknown   Smokeless tobacco: Never  Substance and Sexual Activity   Alcohol use: No   Drug use: No   Sexual activity: Yes    Partners: Male    Birth control/protection: Condom  Other Topics Concern   Not on file  Social History Narrative   Lives at home with dog and cat   Regular exercise; yes   Works as a Buyer, retail at the [employer]   Social Determinants of Health   Financial Resource Strain: Not on file  Food Insecurity: Not on file  Transportation Needs: Not on file  Physical Activity: Not on file  Stress: Not on file  Social Connections: Not on file  Intimate Partner Violence: Not on file    Outpatient Medications Prior to Visit  Medication Sig Dispense Refill   amLODipine (NORVASC) 10 MG tablet Take 1 tablet (10 mg total) by mouth daily. 90 tablet 0   aspirin EC 81 MG EC tablet Take 1 tablet (81 mg total) by mouth daily.     atorvastatin (LIPITOR) 40 MG tablet Take 1 tablet (40 mg total) by mouth daily  at 6 PM. 90 tablet 0   carvedilol (COREG) 25 MG tablet Take 1 tablet (25 mg total) by mouth 2 (two) times daily with a meal. 60 tablet 3   ferrous sulfate 325 (65 FE) MG EC tablet Take 1 tablet (325 mg total) by mouth daily with breakfast. 30 tablet 3   warfarin (COUMADIN) 7.5 MG tablet TAKE AS DIRECTED BY COUMADIN CLINIC 90 tablet 0   No facility-administered medications prior to visit.    No Known Allergies  Review of Systems  Constitutional:  Negative for weight loss.  HENT:  Negative for congestion and hearing loss.   Eyes:  Negative for blurred vision.  Respiratory:  Negative for cough.   Cardiovascular:  Negative for leg swelling.  Gastrointestinal:  Positive for constipation (mild- juice helps). Negative for diarrhea.  Genitourinary:  Negative for dysuria, frequency and hematuria.  Musculoskeletal:  Negative for joint pain and myalgias.  Neurological:  Negative for headaches.  Psychiatric/Behavioral:         Denies depression/anxiety       Objective:    Physical Exam   BP 130/83   Pulse 64   Temp 99 F (37.2 C) (Oral)   Ht 5\' 2"  (1.575 m)   Wt 167 lb 12.8 oz (76.1 kg)   LMP 05/06/2023   SpO2 100%   BMI 30.69 kg/m  Wt Readings from Last 3 Encounters:  05/18/23 167 lb 12.8 oz (76.1 kg)  03/28/23 164 lb 1.6 oz (74.4 kg)  02/25/23 163 lb (73.9 kg)  Physical Exam  Constitutional: She is oriented to person, place, and time. She appears well-developed and well-nourished. No distress.  HENT:  Head: Normocephalic and atraumatic.  Right Ear: Tympanic membrane and ear canal normal.  Left Ear: Tympanic membrane and ear canal normal.  Mouth/Throat: Oropharynx is clear and moist.  Eyes: Pupils are equal, round, and reactive to light. No scleral icterus.  Neck: Normal range of motion. No thyromegaly present.  Cardiovascular: Normal rate and regular rhythm.   Grade 2 systolic murmur heard. Pulmonary/Chest: Effort normal and breath sounds normal. No respiratory distress.  He has no wheezes. She has no rales. She exhibits no tenderness.  Abdominal: Soft. Bowel sounds are normal. She exhibits no distension and no mass. There is no tenderness. There is no rebound and no guarding.  Musculoskeletal: She exhibits no edema.  Lymphadenopathy:    She has no cervical adenopathy.  Neurological: She is alert and oriented to person, place, and time. She has normal patellar reflexes. She exhibits normal muscle tone. Coordination normal.  Skin: Skin is warm and dry.  Psychiatric: She has a normal mood and affect. Her behavior is normal. Judgment and thought content normal.  Breast/pelvic: deferred         Assessment & Plan:  Assessment & Plan:   Problem List Items Addressed This Visit       Unprioritized   Preventative health care    -tetanus and flu shot up to date -Consider COVID booster at pharmacy. -Discuss shingles shot after upcoming birthday. -Continue healthy diet and exercise regimen. -Consider Pap smear next year. -schedule vision/dental visit -schedule mammogram -colo to be scheduled by GI due to hx of Heme positive stool.       Iron deficiency anemia     Causing constipation. -Continue iron supplementation. -Continue current strategies to manage constipation (apple juice, water).      Hyperlipidemia    On atorvastatin, goal LDL <70 due to history of stroke      Relevant Orders   Lipid panel   Antiphospholipid syndrome (HCC)     On Coumadin, INR to be checked today. -Check INR today. -Recheck Coumadin level in a month unless otherwise indicated.       Relevant Orders   Protime-INR   Other Visit Diagnoses     Breast cancer screening by mammogram    -  Primary   Relevant Orders   MM 3D SCREENING MAMMOGRAM BILATERAL BREAST       I am having Orpah Clinton maintain her aspirin EC, ferrous sulfate, warfarin, carvedilol, atorvastatin, and amLODipine.  No orders of the defined types were placed in this  encounter.

## 2023-05-18 NOTE — Telephone Encounter (Signed)
INR is very high.  Spoke to patient she is advised to hold coumadin. Return on Monday for repeat INR and further dose adjustment.   Florentina Addison, can you please call her to set up a nurse visit for Monday for INR check? She will be here at 11:40 for mammogram.

## 2023-05-18 NOTE — Assessment & Plan Note (Signed)
On atorvastatin, goal LDL <70 due to history of stroke

## 2023-05-18 NOTE — Telephone Encounter (Signed)
Appt scheduled for INR on 05/23/23

## 2023-05-18 NOTE — Assessment & Plan Note (Signed)
  On Coumadin, INR to be checked today. -Check INR today. -Recheck Coumadin level in a month unless otherwise indicated.

## 2023-05-18 NOTE — Telephone Encounter (Signed)
CRITICAL VALUE STICKER  CRITICAL VALUE: Protime 60.3                                INR 6.18 (may round to 6.2)  RECEIVER (on-site recipient of call): Me  DATE & TIME NOTIFIED: 05/18/2023 12:54pm  MESSENGER (representative from lab): Clydie Braun  MD NOTIFIED: Efraim Kaufmann  TIME OF NOTIFICATION: 05/18/23 12:54pm  RESPONSE:

## 2023-05-18 NOTE — Patient Instructions (Signed)
VISIT SUMMARY:  Today, we reviewed your overall health during your annual physical. We discussed your current medications, upcoming medical appointments, and general health maintenance. We also addressed your concerns about diet, exercise, and routine check-ups.  YOUR PLAN:  -COLONOSCOPY: A colonoscopy is a procedure to examine the inside of your colon. You have an appointment scheduled with the gastroenterologist in december due to a previous positive stool test for blood.   -HYPERLIPIDEMIA: Hyperlipidemia means you have high levels of cholesterol in your blood. Your cholesterol levels are slightly above the goal despite taking Lipitor. We will check your cholesterol levels today and continue with your current Lipitor prescription.  -IRON SUPPLEMENTATION: Iron supplementation is necessary to prevent or treat low iron levels in your blood, but it can cause constipation. Continue taking your iron supplements and manage constipation with apple juice and increased water intake.  -MAMMOGRAM: A mammogram is an X-ray of the breast used to screen for breast cancer. You are due for a routine mammogram, and we will order this to be scheduled in imaging.  -VISION AND DENTAL: Routine vision and dental check-ups are important for maintaining eye and oral health. Please schedule your vision and dental check-ups.  -ANTICOAGULATION: Anticoagulation therapy with Coumadin helps prevent blood clots. We will check your INR levels today and recheck your Coumadin level in a month unless otherwise indicated.  -GENERAL HEALTH MAINTENANCE: For your general health, consider getting a COVID booster at the pharmacy and discuss getting a shingles shot after your upcoming birthday. Continue with a healthy diet and exercise regimen, and consider scheduling a Pap smear next year.  INSTRUCTIONS:  Please await your lab results and monitor MyChart for updates. We will adjust your Coumadin dose if necessary based on your INR  levels. Confirm if your December 2024 appointment is for a colonoscopy or an office visit. Schedule your vision and dental check-ups, and your mammogram in imaging.

## 2023-05-23 ENCOUNTER — Encounter: Payer: Self-pay | Admitting: Family

## 2023-05-23 ENCOUNTER — Other Ambulatory Visit: Payer: Self-pay

## 2023-05-23 ENCOUNTER — Encounter (HOSPITAL_BASED_OUTPATIENT_CLINIC_OR_DEPARTMENT_OTHER): Payer: Self-pay

## 2023-05-23 ENCOUNTER — Ambulatory Visit (HOSPITAL_BASED_OUTPATIENT_CLINIC_OR_DEPARTMENT_OTHER)
Admission: RE | Admit: 2023-05-23 | Discharge: 2023-05-23 | Disposition: A | Discharge status: Home / Self Care | Payer: Federal, State, Local not specified - PPO | Source: Ambulatory Visit | Attending: Family | Admitting: Family

## 2023-05-23 ENCOUNTER — Ambulatory Visit (INDEPENDENT_AMBULATORY_CARE_PROVIDER_SITE_OTHER): Payer: Federal, State, Local not specified - PPO

## 2023-05-23 DIAGNOSIS — Z1231 Encounter for screening mammogram for malignant neoplasm of breast: Secondary | ICD-10-CM | POA: Insufficient documentation

## 2023-05-23 DIAGNOSIS — D6861 Antiphospholipid syndrome: Secondary | ICD-10-CM

## 2023-05-23 DIAGNOSIS — Z7901 Long term (current) use of anticoagulants: Secondary | ICD-10-CM | POA: Diagnosis not present

## 2023-05-23 LAB — POCT INR: INR: 1.2 — AB (ref 2.0–3.0)

## 2023-05-23 MED ORDER — WARFARIN SODIUM 7.5 MG PO TABS: ORAL_TABLET | ORAL | 0 refills | Status: DC

## 2023-05-23 NOTE — Patient Instructions (Signed)
Sunday- 7.5mg  Monday- 7.5mg  Tuesday- 3.75mg  [id number]  Thursday- 3.75mg  Friday-7.5mg  Saturday-7.5mg 

## 2023-05-23 NOTE — Progress Notes (Signed)
Pt here for INR check per  Goal INR =2.0-3.0  Last INR =6.2  Pt was told to hold coumadin for INR until recheck.    Pt denies recent antibiotics, no dietary changes and no unusual bruising / bleeding.  INR today = 1.2  Pt advised per Dr Carmelia Roller take Coumadin  3.75 2 times a week Tuesday and Thursday and all other days at 7.5mg .

## 2023-05-23 NOTE — Progress Notes (Signed)
Patient could not return this week due to work. Scheduled for Wednesday next week

## 2023-05-31 ENCOUNTER — Other Ambulatory Visit: Payer: Self-pay | Admitting: Family

## 2023-06-01 ENCOUNTER — Ambulatory Visit (INDEPENDENT_AMBULATORY_CARE_PROVIDER_SITE_OTHER): Payer: Federal, State, Local not specified - PPO | Admitting: *Deleted

## 2023-06-01 DIAGNOSIS — Z7901 Long term (current) use of anticoagulants: Secondary | ICD-10-CM

## 2023-06-01 LAB — POCT INR: INR: 2.9 (ref 2.0–3.0)

## 2023-06-01 NOTE — Patient Instructions (Signed)
Warfarin Dose Schedule  Sunday- 7.5mg  Monday- 7.5mg  Tuesday- 3.75mg  Wednesday-7.5mg  Thursday- 3.75mg  Friday-7.5mg  Saturday-7.5mg 

## 2023-06-01 NOTE — Progress Notes (Signed)
Pt here for INR check per   Goal INR =2.0-3.0   Last INR = 1.2   Pt taking warfarin 3.75mg  2 times a week Tuesday and Thursday and all other days at 7.5mg .    Pt denies recent antibiotics, no dietary changes and no unusual bruising / bleeding.   INR today = 2.9   Pt advised per Melissa continue same regimen and repeat in 2 weeks.  Pt scheduled for 06/16/23

## 2023-06-02 ENCOUNTER — Encounter: Payer: Self-pay | Admitting: Nurse Practitioner

## 2023-06-02 ENCOUNTER — Ambulatory Visit: Payer: Federal, State, Local not specified - PPO | Admitting: Nurse Practitioner

## 2023-06-02 VITALS — BP 120/80 | HR 74 | Ht 62.0 in | Wt 170.0 lb

## 2023-06-02 DIAGNOSIS — D509 Iron deficiency anemia, unspecified: Secondary | ICD-10-CM

## 2023-06-02 DIAGNOSIS — R195 Other fecal abnormalities: Secondary | ICD-10-CM

## 2023-06-02 DIAGNOSIS — Z7901 Long term (current) use of anticoagulants: Secondary | ICD-10-CM | POA: Diagnosis not present

## 2023-06-02 MED ORDER — NA SULFATE-K SULFATE-MG SULF 17.5-3.13-1.6 GM/177ML PO SOLN: 1.0000 | Freq: Once | ORAL | 0 refills | Status: AC

## 2023-06-02 NOTE — Patient Instructions (Signed)
You have been scheduled for an endoscopy and colonoscopy. Please follow the written instructions given to you at your visit today.  Please pick up your prep supplies at the pharmacy within the next 1-3 days.  If you use inhalers (even only as needed), please bring them with you on the day of your procedure.  DO NOT TAKE 7 DAYS PRIOR TO TEST- Trulicity (dulaglutide) Ozempic, Wegovy (semaglutide) Mounjaro (tirzepatide) Bydureon Bcise (exanatide extended release)  DO NOT TAKE 1 DAY PRIOR TO YOUR TEST Rybelsus (semaglutide) Adlyxin (lixisenatide) Victoza (liraglutide) Byetta (exanatide) ___________________________________________________________________  _______________________________________________________  If your blood pressure at your visit was 140/90 or greater, please contact your primary care physician to follow up on this.  _______________________________________________________  If you are age 98 or older, your body mass index should be between 23-30. Your Body mass index is 31.09 kg/m. If this is out of the aforementioned range listed, please consider follow up with your Primary Care Provider.  If you are age 12 or younger, your body mass index should be between 19-25. Your Body mass index is 31.09 kg/m. If this is out of the aformentioned range listed, please consider follow up with your Primary Care Provider.   ________________________________________________________  The [facility] providers would like to encourage you to use [facility] to communicate with providers for non-urgent requests or questions.  Due to long hold times on the telephone, sending your provider a message by [facility] may be a faster and more efficient way to get a response.  Please allow 48 business hours for a response.  Please remember that this is for non-urgent requests.  _______________________________________________________

## 2023-06-02 NOTE — Progress Notes (Signed)
Brief Narrative 47 y.o. yo female , new to the practice,  with a past medical history not limited to CVA in 2021, antiphospholipid syndrome, iron deficiency anemia, and hypertension. Referred by PCP for hemoccult positive stool   ASSESSMENT    Hemoccult positive stool on warfarin.  No overt GI blood loss. No Gi symptoms.   History of iron deficiency anemia presumably 2/2 to menstrual cycle losses   Followed by Hematology - Dr Leonides Schanz. On oral iron. October labs remarkable for normal hgb of 12.3 but ferritin still lower end of normal at 36.   Antiphospholipid syndrome with CVA, also followed by Hematology. On chronic warfarin  See PMH for any additional medical history   PLAN    --Schedule for a EGD and colonoscopy for evaluation of Hemoccult positive stool on warfarin and also history of iron deficiency anemia though this is most likely from menstrual losses. The risks and benefits of EGD and colonoscopy with possible polypectomy / biopsies were discussed and the patient agrees to proceed.  --Hold Coumadin for 5 days before procedure - will instruct when and how to resume after procedure. Patient understands that there is a low but real risk of cardiovascular event such as heart attack, stroke, or embolism /  thrombosis, or ischemia while off Coumadin. The patient consents to proceed. Will communicate by phone or EMR with patient's prescribing provider to confirm that holding Coumadin is reasonable in this case.    HPI   Chief complaint :  hemoccult blood in stool  History of iron deficiency anemia followed by Hematology.  On oral iron for years.  Most recent Ferriin in Oct 2024 was only 38 but hgb was up from 11.3 to 12.3.  Stools are dark on iron but no obvious blood.  Menstrual cycle last a couple of days longer and bleeding a little heavier since starting warfarin.  Iron sometimes constipates her but manages with apple juice. Has daily BM.   Takes a daily baby asa, no other  NSAID  No FMH of colon cancer.   Had colonoscopy at age 51 for rectal bleeding. No polyps. She cannot remember who in [place] did the procedure.   Labs      Latest Ref Rng & Units 03/28/2023    2:57 PM 03/09/2023   11:53 AM 09/13/2022   11:04 AM  CBC  WBC 4.0 - 10.5 K/uL 5.0  5.8  3.7   Hemoglobin 12.0 - 15.0 g/dL 16.1  09.6  04.5   Hematocrit 36.0 - 46.0 % 38.0  34.3  38.0   Platelets 150 - 400 K/uL 595  673.0  539     No results found for: "LIPASE"    Latest Ref Rng & Units 03/28/2023    2:57 PM 01/26/2023   12:32 PM 10/04/2022   12:15 PM  CMP  Glucose 70 - 99 mg/dL 82  83  409   BUN 6 - 20 mg/dL 14  15  12    Creatinine 0.44 - 1.00 mg/dL 8.11  9.14  7.82   Sodium 135 - 145 mmol/L 139  136  139   Potassium 3.5 - 5.1 mmol/L 3.8  4.4  3.8   Chloride 98 - 111 mmol/L 108  103  106   CO2 22 - 32 mmol/L 23  25  24    Calcium 8.9 - 10.3 mg/dL 9.5  9.4  9.1   Total Protein 6.5 - 8.1 g/dL 8.0   7.2   Total Bilirubin 0.3 -  1.2 mg/dL 0.5   0.3   Alkaline Phos 38 - 126 U/L 49   48   AST 15 - 41 U/L 12   12   ALT 0 - 44 U/L 8   9      Past Medical History:  Diagnosis Date   Blood in stool    hx of   Cerebrovascular accident (CVA) (HCC) 06/20/2019   Heart murmur    Hypertension    Syncope    Past Surgical History:  Procedure Laterality Date   BUBBLE STUDY  06/25/2019   Procedure: BUBBLE STUDY;  Surgeon: Little Ishikawa, MD;  Location: [facility] ENDOSCOPY;  Service: Endoscopy;;   TEE WITHOUT CARDIOVERSION N/A 06/25/2019   Procedure: TRANSESOPHAGEAL ECHOCARDIOGRAM (TEE);  Surgeon: Little Ishikawa, MD;  Location: [facility] ENDOSCOPY;  Service: Endoscopy;  Laterality: N/A;   Family History  Problem Relation Age of Onset   Hypertension Mother    Hypertension Father    Diabetes Father        diet controlled   Pancreatic cancer Neg Hx    Colon cancer Neg Hx    Stomach cancer Neg Hx    Esophageal cancer Neg Hx    Social History   Tobacco Use   Smoking status: Unknown    Smokeless tobacco: Never  Vaping Use   Vaping status: Some Days  Substance Use Topics   Alcohol use: Yes    Comment: occ   Drug use: No   Current Outpatient Medications  Medication Sig Dispense Refill   amLODipine (NORVASC) 10 MG tablet Take 1 tablet (10 mg total) by mouth daily. 90 tablet 0   aspirin EC 81 MG EC tablet Take 1 tablet (81 mg total) by mouth daily.     atorvastatin (LIPITOR) 40 MG tablet Take 1 tablet (40 mg total) by mouth daily at 6 PM. 90 tablet 0   carvedilol (COREG) 25 MG tablet Take 1 tablet (25 mg total) by mouth 2 (two) times daily with a meal. 180 tablet 0   ferrous sulfate 325 (65 FE) MG EC tablet Take 1 tablet (325 mg total) by mouth daily with breakfast. 30 tablet 3   warfarin (COUMADIN) 7.5 MG tablet Take as directed by Coumadin Clinic 90 tablet 0   No current facility-administered medications for this visit.   No Known Allergies   Review of Systems: All systems reviewed and negative except where noted in HPI.   Wt Readings from Last 3 Encounters:  06/02/23 170 lb (77.1 kg)  05/18/23 167 lb 12.8 oz (76.1 kg)  03/28/23 164 lb 1.6 oz (74.4 kg)    Physical Exam:  BP 120/80 (BP Location: Left Arm, Patient Position: Sitting, Cuff Size: Normal)   Pulse 74   Ht 5\' 2"  (1.575 m)   Wt 170 lb (77.1 kg)   LMP 05/06/2023   SpO2 95%   BMI 31.09 kg/m  Constitutional:  Pleasant, generally well appearing female in no acute distress. Psychiatric:  Normal mood and affect. Behavior is normal. EENT: Pupils normal.  Conjunctivae are normal. No scleral icterus. Neck supple.  Cardiovascular: Normal rate, regular rhythm.  Pulmonary/chest: Effort normal and breath sounds normal. No wheezing, rales or rhonchi. Abdominal: Soft, nondistended, nontender. Bowel sounds active throughout. There are no masses palpable. No hepatomegaly. Neurological: Alert and oriented to person place and time.   Willette Cluster, NP  06/02/2023, 11:58 AM  Cc:  Referring  Provider [name], NP

## 2023-06-02 NOTE — Progress Notes (Signed)
I agree with the assessment and plan as outlined by Ms. Guenther. 

## 2023-06-07 ENCOUNTER — Encounter: Payer: Federal, State, Local not specified - PPO | Admitting: Family

## 2023-06-09 ENCOUNTER — Other Ambulatory Visit: Payer: Self-pay

## 2023-06-09 DIAGNOSIS — D6861 Antiphospholipid syndrome: Secondary | ICD-10-CM

## 2023-06-09 MED ORDER — WARFARIN SODIUM 7.5 MG PO TABS: ORAL_TABLET | ORAL | 0 refills | Status: DC

## 2023-06-16 ENCOUNTER — Other Ambulatory Visit (INDEPENDENT_AMBULATORY_CARE_PROVIDER_SITE_OTHER): Payer: Federal, State, Local not specified - PPO

## 2023-06-16 ENCOUNTER — Telehealth: Payer: Self-pay

## 2023-06-16 ENCOUNTER — Ambulatory Visit: Payer: Federal, State, Local not specified - PPO | Admitting: Emergency Medicine

## 2023-06-16 DIAGNOSIS — Z7901 Long term (current) use of anticoagulants: Secondary | ICD-10-CM | POA: Diagnosis not present

## 2023-06-16 LAB — PROTIME-INR
INR: 7.5 {ratio} (ref 0.8–1.0)
Prothrombin Time: 72.3 s (ref 9.6–13.1)

## 2023-06-16 NOTE — Progress Notes (Signed)
Pt here for INR check per Dr Laury Axon  Goal INR = 2.0 - 3.0  Last INR = 7.8 POCT, 7.5- lab: yesterday 06/16/23  Pt currently takes Coumadin 3.75mg  2 times a week Tuesday and Thursday and all other days at 7.5mg . WAS ADVISED TO HOLD LAST NIGHTS DOSE.    Pt denies recent antibiotics, no dietary changes and she does have a lot of bruising on her left hand/arm.  INR today = 7.7  Pt advised per DOD: Hold Coumadin for the weekend and recheck her INR on Monday.   Per patient she is going on a cruise leaving on Sunday and she will not be back until Friday. She could come Friday pm for an INR check. Per Dr. Drue Novel, can not advise on dosage of Coumadin without INR recheck. He advised for patient to take her Coumadin with her and speak with the provider on the ship about checking her levels.   Patient was sent home before plan was developed and advised we would send a mychart with instructions and she agreed with this plan. Information sent to patient advising her of the plan and appointment made for INR follow up here on Friday afternoon. Mychart message sent to patient.

## 2023-06-16 NOTE — Progress Notes (Signed)
Pt here for INR check per  Goal INR = 2 to 3 Last INR = 2.9  Pt currently takes Coumadin Pt taking warfarin 3.75mg  2 times a week Tuesday and Thursday and all other days at 7.5mg .   Pt denies recent antibiotics, no dietary changes and no unusual bruising / bleeding.  INR today = 7.8  Pt advised per Dr. Laury Axon order an INR check in he lab. Also do not take coumadin today.

## 2023-06-16 NOTE — Telephone Encounter (Signed)
Lab addressed by DOD

## 2023-06-16 NOTE — Telephone Encounter (Signed)
Phyllis Hopkins from [facility] called with critical lab value. Protime- 72.3 and INR- 7.5 Values read back and verified. E2C2 nurse call CAL line and spoke with Tierra-MA, and gave results.

## 2023-06-16 NOTE — Telephone Encounter (Signed)
CRITICAL VALUE STICKER  CRITICAL VALUE: PT: 72.3 INR: 7.5  RECEIVER (on-site recipient of call): Melton Alar, RMA  DATE & TIME NOTIFIED:  06/16/23, 4:38 PM  MESSENGER (representative from lab): E2c2- [facility]  MD NOTIFIED: Laury Axon: DOD  TIME OF NOTIFICATION: 06/16/23, 4:38 PM  RESPONSE:  Pending

## 2023-06-16 NOTE — Telephone Encounter (Signed)
Addressed.

## 2023-06-17 ENCOUNTER — Encounter: Payer: Self-pay | Admitting: Neurology

## 2023-06-17 ENCOUNTER — Ambulatory Visit (INDEPENDENT_AMBULATORY_CARE_PROVIDER_SITE_OTHER): Payer: Federal, State, Local not specified - PPO | Admitting: Neurology

## 2023-06-17 DIAGNOSIS — Z7901 Long term (current) use of anticoagulants: Secondary | ICD-10-CM | POA: Diagnosis not present

## 2023-06-17 LAB — POCT INR: INR: 7.7 — AB (ref 2.0–3.0)

## 2023-06-17 NOTE — Progress Notes (Addendum)
Spoke with the patient, advised that her INR is high, she is prone to heavy bleeding until that is corrected. If she is willing to cancel her cruise I am willing to write a letter to that effect (she is not). I encouraged her to check the Coumadin with the doctor on board She states that she will come back as a scheduled for Coumadin check on 06/24/2023 JP

## 2023-06-17 NOTE — Patient Instructions (Signed)
Hold Coumadin until you are seen for a repeat INR on Monday

## 2023-06-20 ENCOUNTER — Telehealth: Payer: Self-pay | Admitting: *Deleted

## 2023-06-20 NOTE — Telephone Encounter (Signed)
  Phyllis Hopkins December 13, 1975 [id number]  @DATE @   Dear Dr. Leonides Schanz:  We have scheduled the above named patient for a(n) upper endoscopy/colonoscopy procedure. Our records show that (s)he is on anticoagulation therapy.  Please advise as to whether the patient may come off their therapy of Coumadin 5 days prior to their procedure which is scheduled for 07/13/2023.  Please route your response to Cristela Felt, CMA or fax response to [phone].  Sincerely,   Cristela Felt, [facility]

## 2023-06-24 ENCOUNTER — Ambulatory Visit (INDEPENDENT_AMBULATORY_CARE_PROVIDER_SITE_OTHER): Payer: Federal, State, Local not specified - PPO | Admitting: Emergency Medicine

## 2023-06-24 DIAGNOSIS — Z7901 Long term (current) use of anticoagulants: Secondary | ICD-10-CM

## 2023-06-24 LAB — POCT INR: INR: 1.1 — AB (ref 2.0–3.0)

## 2023-06-24 NOTE — Telephone Encounter (Signed)
**Note De-identified  [place] Obfuscation** Please advise 

## 2023-06-24 NOTE — Progress Notes (Signed)
 06/17/2023: INR was 7.7. She held Coumadin until 06/22/2023 when she went back to her routine dose. INR today: 1.1. Plan: Continue normal Coumadin dose, come back in 5 days for another Coumadin check

## 2023-06-24 NOTE — Addendum Note (Signed)
 Addended by: Marian Sorrow D on: 06/24/2023 04:43 PM   Modules accepted: Orders

## 2023-06-24 NOTE — Progress Notes (Signed)
 Pt here for INR check per  Goal INR = 2.0 - 3.0  Last INR = 7.7  Pt currently takes Coumadin  Pt stated did not take it Friday (06/17/2023) through (06/21/2023) Resumed taking medication Wed & Thur. (1/1 - 1/2)  Pt denies recent antibiotics, no dietary changes and no unusual bruising / bleeding.  INR today = 1.1  Pt advised per Dr. Amon continue with current regimen. Come back in 5 days.

## 2023-06-27 ENCOUNTER — Telehealth: Payer: Self-pay | Admitting: *Deleted

## 2023-06-27 NOTE — Telephone Encounter (Signed)
  Phyllis Hopkins 1976-04-11 [id number]  @DATE @   Dear Dr. Daryl:  We have scheduled the above named patient for a(n) colonoscopy procedure. Our records show that (s)he is on anticoagulation therapy.  Please advise as to whether the patient may come off their therapy of Coumadin  5 days prior to their procedure which is scheduled for 07/13/2023.  Please route your response to Powell Misty, CMA or fax response to [phone].  Sincerely,   Powell Misty, [facility]

## 2023-06-27 NOTE — Telephone Encounter (Signed)
 New request sent to patient's PCP.

## 2023-06-28 NOTE — Telephone Encounter (Signed)
-----   Message from Norleen ONEIDA Kidney IV sent at 06/27/2023  4:30 PM EST ----- Thank you for reaching out. I do recommend a lovenox  bridge before the procedure and restarting after the procedure, Her thrombotic risk is quite high with APS and I want to be as cautious as possible.  Transition to Xarelto is very risky. Unfortunately the rates of stroke and clot is significantly higher compared to coumadin . In certain instances where the patient refuses to take coumadin  or lovenox  we can transition to Xarelto with the understanding that it is clearly inferior to coumadin  in preventing thrombotic complications. I would encourage her to be more faithful with coumadin  and only transition to Xarelto as a last resort.   Please let me know if you have any questions or concerns.    Benjaman Heinz ----- Message ----- From: Daryl Setter, NP Sent: 06/27/2023  10:57 AM EST To: Norleen ONEIDA Kidney [place], MD  Dear Dr. Kidney,  I am Ms. Mackley's PCP.  She is scheduled for an upcoming colonoscopy and I wanted to confirm with you that she can be bridged with Lovenox  for her upcoming procedure. I assume that she will need to be bridged post op until INR is therapeutic as well?  My other concern with her is her non-compliance with coumadin .  She will often skip doses and then double up prior to appointments so that her INR will look better. I have spoken with her many times about how dangerous this is but it appears she continues as her most recent INR was 7.7 and she had an INR 1 month ago of 6 with intermittent subtherapeutic INR's in the middle.  Normally, I would think that xarelto would be a safer choice for a patient like this, however as I understand it coumadin  is preferred agent with anti-phospholipid syndrome.  I just wonder if the risk of bleeding while supratherapeutic and clot while sub-therapeutic outweighs the potential benefit of being on Coumadin  instead of a DOAC.  Just wanted you to be aware of her situation  and see if you had any thoughts/ideas.  Thanks,  Gerry Heaphy

## 2023-06-30 ENCOUNTER — Ambulatory Visit (INDEPENDENT_AMBULATORY_CARE_PROVIDER_SITE_OTHER): Payer: Federal, State, Local not specified - PPO

## 2023-06-30 DIAGNOSIS — Z7901 Long term (current) use of anticoagulants: Secondary | ICD-10-CM | POA: Diagnosis not present

## 2023-06-30 LAB — POCT INR: INR: 1.9 — AB (ref 2.0–3.0)

## 2023-06-30 MED ORDER — ENOXAPARIN SODIUM 80 MG/0.8ML IJ SOSY: 80.0000 mg | PREFILLED_SYRINGE | Freq: Two times a day (BID) | INTRAMUSCULAR | 0 refills | Status: AC

## 2023-06-30 NOTE — Telephone Encounter (Signed)
 Called patient but no answer and not accepting voice messages.

## 2023-06-30 NOTE — Progress Notes (Signed)
 Pt here for INR check per Melissa  Goal INR = 2.0-3.0  Last INR = 1.1  Pt currently takes Coumadin  3.75mg  2 times a week (Tuesday and Thursday) and all other days at 7.5mg . She is scheduled for cscope in 2 weeks. They will require her to stop Coumadin  prior.    Pt denies recent antibiotics, no dietary changes and no unusual bruising / bleeding.  INR today = 1.9  Pt advised per Dr. Domenica- continue same Coumadin  dose, recheck in 6-7 days when Melissa is here and she can then decide how to manage Coumadin  for cscope.   Spoke w/ Pt via telephone- informed her of plan, she will check her schedule and call back to schedule nurse visit for next week. She informed that Eleanor has already informed her how to stop/management Coumadin  for cscope.

## 2023-06-30 NOTE — Telephone Encounter (Signed)
 Recommend the following:  For planned 1/22 colonoscopy, last dose of coumadin  should be taken on evening of 1/16.  Begin Lovenox  injection AM 1/19. Dose is 80 mg SQ Q12 hours.  She should take the last dose of pre-op Lovenox  AM 1/21.  Restart coumadin  and lovenox  12 hours after colonoscopy.  Come in Monday 1/27 for INR check.    Levorn, I have sent the detailed instructions to the patient's mychart. Could you please contact the patient and review these instructions with her? We will need to schedule the follow up nurse visit on 1/27 please and make sure she is comfortable performing the injections.   Powell Misty- FYI, thank you.

## 2023-07-01 NOTE — Telephone Encounter (Signed)
 Called again but no answer and mail box is full. Hopefully she will see the instructions on her MyChart.

## 2023-07-05 NOTE — Telephone Encounter (Signed)
 Letter sent to patient's address with this information. Enveloped marked  as URGENT

## 2023-07-05 NOTE — Telephone Encounter (Signed)
 Called  patient again  but still no answer and she has not call us back

## 2023-07-06 ENCOUNTER — Encounter: Payer: Self-pay | Admitting: Internal Medicine

## 2023-07-08 ENCOUNTER — Ambulatory Visit (INDEPENDENT_AMBULATORY_CARE_PROVIDER_SITE_OTHER): Payer: Federal, State, Local not specified - PPO | Admitting: Emergency Medicine

## 2023-07-08 DIAGNOSIS — Z7901 Long term (current) use of anticoagulants: Secondary | ICD-10-CM

## 2023-07-08 LAB — POCT INR: INR: 3.3 — AB (ref 2.0–3.0)

## 2023-07-08 NOTE — Progress Notes (Signed)
Pt here for INR check per [name]  Goal INR = 2.0 - 3.0  Last INR = 1.9  Pt currently takes Coumadin 3.75mg  2 times a week (Tuesday and Thursday) and all other days at 7.5mg .   Pt denies recent antibiotics, no dietary changes and no unusual bruising / bleeding.  INR today = 3.3  Pt advised per [name] leave regimen as is. Restart Coumadin and Lovenox 12 hours after colonoscopy. Has appointment with [facility] Monday (07/18/2023)

## 2023-07-13 ENCOUNTER — Encounter: Payer: Self-pay | Admitting: Internal Medicine

## 2023-07-13 ENCOUNTER — Other Ambulatory Visit: Payer: Self-pay | Admitting: Internal Medicine

## 2023-07-13 ENCOUNTER — Ambulatory Visit: Payer: Federal, State, Local not specified - PPO | Admitting: Internal Medicine

## 2023-07-13 VITALS — BP 141/76 | HR 71 | Temp 98.4°F | Resp 19 | Ht 62.0 in | Wt 170.0 lb

## 2023-07-13 DIAGNOSIS — R195 Other fecal abnormalities: Secondary | ICD-10-CM

## 2023-07-13 DIAGNOSIS — K648 Other hemorrhoids: Secondary | ICD-10-CM

## 2023-07-13 DIAGNOSIS — D509 Iron deficiency anemia, unspecified: Secondary | ICD-10-CM

## 2023-07-13 DIAGNOSIS — K5730 Diverticulosis of large intestine without perforation or abscess without bleeding: Secondary | ICD-10-CM | POA: Diagnosis not present

## 2023-07-13 DIAGNOSIS — K2970 Gastritis, unspecified, without bleeding: Secondary | ICD-10-CM | POA: Diagnosis not present

## 2023-07-13 DIAGNOSIS — K635 Polyp of colon: Secondary | ICD-10-CM | POA: Diagnosis not present

## 2023-07-13 DIAGNOSIS — K3189 Other diseases of stomach and duodenum: Secondary | ICD-10-CM

## 2023-07-13 DIAGNOSIS — D120 Benign neoplasm of cecum: Secondary | ICD-10-CM

## 2023-07-13 DIAGNOSIS — Z1211 Encounter for screening for malignant neoplasm of colon: Secondary | ICD-10-CM | POA: Diagnosis not present

## 2023-07-13 DIAGNOSIS — K633 Ulcer of intestine: Secondary | ICD-10-CM

## 2023-07-13 DIAGNOSIS — K319 Disease of stomach and duodenum, unspecified: Secondary | ICD-10-CM | POA: Diagnosis not present

## 2023-07-13 DIAGNOSIS — K6389 Other specified diseases of intestine: Secondary | ICD-10-CM | POA: Diagnosis not present

## 2023-07-13 MED ORDER — PANTOPRAZOLE SODIUM 40 MG PO TBEC: DELAYED_RELEASE_TABLET | ORAL | 1 refills | Status: DC

## 2023-07-13 MED ORDER — SODIUM CHLORIDE 0.9 % IV SOLN: 500.0000 mL | Freq: Once | INTRAVENOUS | Status: DC

## 2023-07-13 NOTE — Op Note (Signed)
[facility] Patient Name: Phyllis Hopkins Procedure Date: 07/13/2023 2:10 PM MRN: [id number] Endoscopist: Madelyn Brunner [place] , , [phone] Age: 48 Referring MD:  Date of Birth: 01/18/1976 Gender: Female Account #: [id number] Procedure:                Upper GI endoscopy Indications:              Iron deficiency anemia Medicines:                Monitored Anesthesia Care Procedure:                Pre-Anesthesia Assessment:                           - Prior to the procedure, a History and Physical                            was performed, and patient medications and                            allergies were reviewed. The patient's tolerance of                            previous anesthesia was also reviewed. The risks                            and benefits of the procedure and the sedation                            options and risks were discussed with the patient.                            All questions were answered, and informed consent                            was obtained. Prior Anticoagulants: The patient                            last took Coumadin (warfarin) 6 days and Lovenox                            (enoxaparin) 1 day prior to the procedure. ASA                            Grade Assessment: II - A patient with mild systemic                            disease. After reviewing the risks and benefits,                            the patient was deemed in satisfactory condition to                            undergo the procedure.  After obtaining informed consent, the endoscope was                            passed under direct vision. Throughout the                            procedure, the patient's blood pressure, pulse, and                            oxygen saturations were monitored continuously. The                            GIF [id number] was introduced through the                            mouth, and advanced to the second  part of duodenum.                            The upper GI endoscopy was accomplished without                            difficulty. The patient tolerated the procedure                            well. Scope In: Scope Out: Findings:                 The examined esophagus was normal.                           Localized inflammation characterized by congestion                            (edema), erosions and erythema was found in the                            gastric antrum. Biopsies were taken with a cold                            forceps for histology.                           The examined duodenum was normal. Biopsies were                            taken with a cold forceps for histology. Complications:            No immediate complications. Estimated Blood Loss:     Estimated blood loss was minimal. Impression:               - Normal esophagus.                           - Gastritis. Biopsied.                           - Normal examined  duodenum. Biopsied. Recommendation:           - Await pathology results.                           - Use Protonix (pantoprazole) 40 mg PO daily for 8                            weeks.                           - Perform a colonoscopy today. Dr Particia Lather "[address] [place],  07/13/2023 2:40:48 PM

## 2023-07-13 NOTE — Op Note (Signed)
[facility] Patient Name: Phyllis Hopkins Procedure Date: 07/13/2023 2:10 PM MRN: [id number] Endoscopist: Madelyn Brunner [place] , , [phone] Age: 48 Referring MD:  Date of Birth: 1975/10/01 Gender: Female Account #: [id number] Procedure:                Colonoscopy Indications:              Iron deficiency anemia, Positive fecal                            immunochemical test Medicines:                Monitored Anesthesia Care Procedure:                Pre-Anesthesia Assessment:                           - Prior to the procedure, a History and Physical                            was performed, and patient medications and                            allergies were reviewed. The patient's tolerance of                            previous anesthesia was also reviewed. The risks                            and benefits of the procedure and the sedation                            options and risks were discussed with the patient.                            All questions were answered, and informed consent                            was obtained. Prior Anticoagulants: The patient                            last took Coumadin (warfarin) 6 days and Lovenox                            (enoxaparin) 1 day prior to the procedure. ASA                            Grade Assessment: II - A patient with mild systemic                            disease. After reviewing the risks and benefits,                            the patient was deemed in satisfactory condition to  undergo the procedure.                           After obtaining informed consent, the colonoscope                            was passed under direct vision. Throughout the                            procedure, the patient's blood pressure, pulse, and                            oxygen saturations were monitored continuously. The                            Olympus Scope SN [id number] was introduced through the                             anus and advanced to the the terminal ileum. The                            colonoscopy was performed without difficulty. The                            patient tolerated the procedure well. The quality                            of the bowel preparation was good. The terminal                            ileum, ileocecal valve, appendiceal orifice, and                            rectum were photographed. Scope In: 2:22:09 PM Scope Out: 2:35:29 PM Scope Withdrawal Time: 0 hours 11 minutes 13 seconds  Total Procedure Duration: 0 hours 13 minutes 20 seconds  Findings:                 The terminal ileum appeared normal.                           A few localized non-bleeding erosions were found in                            the ascending colon. No stigmata of recent bleeding                            were seen. Biopsies were taken with a cold forceps                            for histology.                           Two sessile polyps were found in the ascending  colon and cecum. The polyps were 3 to 6 mm in size.                            These polyps were removed with a cold snare.                            Resection and retrieval were complete.                           A few diverticula were found in the sigmoid colon.                           A 3 mm polyp was found in the sigmoid colon. The                            polyp was sessile. The polyp was removed with a                            cold snare. Resection and retrieval were complete.                           Non-bleeding internal hemorrhoids were found during                            retroflexion. Complications:            No immediate complications. Estimated Blood Loss:     Estimated blood loss was minimal. Impression:               - The examined portion of the ileum was normal.                           - A few erosions in the ascending colon. Biopsied.                            - Two 3 to 6 mm polyps in the ascending colon and                            in the cecum, removed with a cold snare. Resected                            and retrieved.                           - Diverticulosis in the sigmoid colon.                           - One 3 mm polyp in the sigmoid colon, removed with                            a cold snare. Resected and retrieved.                           -  Non-bleeding internal hemorrhoids. Recommendation:           - Discharge patient to home (with escort).                           - Await pathology results.                           - Okay to restart your warfarin and Lovenox as                            instructed by your warfarin clinic.                           - The findings and recommendations were discussed                            with the patient. Dr Particia Lather "[address] [place],  07/13/2023 2:43:58 PM

## 2023-07-13 NOTE — Progress Notes (Signed)
GASTROENTEROLOGY PROCEDURE H&P NOTE   Primary Care Physician: [name], NP    Reason for Procedure:   IDA, positive FOBT  Plan:    EGD/colonoscopy  Patient is appropriate for endoscopic procedure(s) in the ambulatory ([facility]) setting.  The nature of the procedure, as well as the risks, benefits, and alternatives were carefully and thoroughly reviewed with the patient. Ample time for discussion and questions allowed. The patient understood, was satisfied, and agreed to proceed.     HPI: Phyllis Hopkins is a 48 y.o. female who presents for EGD/colonoscopy for evaluation of IDA and positive FOBT .  Patient was most recently seen in the Gastroenterology Clinic on 06/02/23.  No interval change in medical history since that appointment. Please refer to that note for full details regarding GI history and clinical presentation.   Past Medical History:  Diagnosis Date   Blood in stool    hx of   Cerebrovascular accident (CVA) (HCC) 06/20/2019   Heart murmur    Hypertension    Syncope     Past Surgical History:  Procedure Laterality Date   BUBBLE STUDY  06/25/2019   Procedure: BUBBLE STUDY;  Surgeon: Little Ishikawa, MD;  Location: [facility] ENDOSCOPY;  Service: Endoscopy;;   TEE WITHOUT CARDIOVERSION N/A 06/25/2019   Procedure: TRANSESOPHAGEAL ECHOCARDIOGRAM (TEE);  Surgeon: Little Ishikawa, MD;  Location: [facility] ENDOSCOPY;  Service: Endoscopy;  Laterality: N/A;    Prior to Admission medications   Medication Sig Start Date End Date Taking? Authorizing Provider  amLODipine (NORVASC) 10 MG tablet Take 1 tablet (10 mg total) by mouth daily. 05/04/23  Yes [name], NP  aspirin EC 81 MG EC tablet Take 1 tablet (81 mg total) by mouth daily. 06/26/19  Yes Layne Benton, NP  atorvastatin (LIPITOR) 40 MG tablet Take 1 tablet (40 mg total) by mouth daily at 6 PM. 02/02/23  Yes [name], NP  carvedilol (COREG) 25 MG tablet Take 1 tablet (25 mg total) by  mouth 2 (two) times daily with a meal. 05/31/23  Yes [name], NP  enoxaparin (LOVENOX) 80 MG/0.8ML injection Inject 0.8 mLs (80 mg total) into the skin every 12 (twelve) hours. As directed 06/30/23  Yes [name], NP  ferrous sulfate 325 (65 FE) MG EC tablet Take 1 tablet (325 mg total) by mouth daily with breakfast. 03/26/20  Yes [name], NP  warfarin (COUMADIN) 7.5 MG tablet Take 1/2 tablet (3.75 mg) on Tuesday and Thursday and 1 tablet (7.5 mg) on Monday,Wednesday, Friday, Saturday and Sunday. 06/09/23   [name], NP    Current Outpatient Medications  Medication Sig Dispense Refill   amLODipine (NORVASC) 10 MG tablet Take 1 tablet (10 mg total) by mouth daily. 90 tablet 0   aspirin EC 81 MG EC tablet Take 1 tablet (81 mg total) by mouth daily.     atorvastatin (LIPITOR) 40 MG tablet Take 1 tablet (40 mg total) by mouth daily at 6 PM. 90 tablet 0   carvedilol (COREG) 25 MG tablet Take 1 tablet (25 mg total) by mouth 2 (two) times daily with a meal. 180 tablet 0   enoxaparin (LOVENOX) 80 MG/0.8ML injection Inject 0.8 mLs (80 mg total) into the skin every 12 (twelve) hours. As directed 20 mL 0   ferrous sulfate 325 (65 FE) MG EC tablet Take 1 tablet (325 mg total) by mouth daily with breakfast. 30 tablet 3   warfarin (COUMADIN) 7.5 MG tablet Take 1/2 tablet (3.75 mg) on Tuesday  and Thursday and 1 tablet (7.5 mg) on Monday,Wednesday, Friday, Saturday and Sunday. 90 tablet 0   Current Facility-Administered Medications  Medication Dose Route Frequency Provider Last Rate Last Admin   0.9 %  sodium chloride infusion  500 mL Intravenous Once Imogene Burn, MD        Allergies as of 07/13/2023   (No Known Allergies)    Family History  Problem Relation Age of Onset   Hypertension Mother    Hypertension Father    Diabetes Father        diet controlled   Pancreatic cancer Neg Hx    Colon cancer Neg Hx    Stomach cancer Neg Hx    Esophageal cancer Neg  Hx    Rectal cancer Neg Hx     Social History   Socioeconomic History   Marital status: Divorced    Spouse name: Not on file   Number of children: Not on file   Years of education: Not on file   Highest education level: Not on file  Occupational History   Occupation: respiratory therapistory therapist    Comment: [employer]  Tobacco Use   Smoking status: Unknown   Smokeless tobacco: Never  Vaping Use   Vaping status: Some Days   Substances: Nicotine  Substance and Sexual Activity   Alcohol use: Yes    Comment: occ   Drug use: No   Sexual activity: Yes    Partners: Male    Birth control/protection: Condom  Other Topics Concern   Not on file  Social History Narrative   Lives at home with dog and cat   Regular exercise; yes   Works as a Buyer, retail at the [employer]   Social Drivers of Corporate investment banker Strain: Not on file  Food Insecurity: Not on file  Transportation Needs: Not on file  Physical Activity: Not on file  Stress: Not on file  Social Connections: Not on file  Intimate Partner Violence: Not on file    Physical Exam: Vital signs in last 24 hours: BP (!) 150/95   Pulse 61   Temp 98.4 F (36.9 C)   Ht 5\' 2"  (1.575 m)   Wt 170 lb (77.1 kg)   LMP 07/05/2023 (Exact Date)   SpO2 99%   BMI 31.09 kg/m  GEN: NAD EYE: Sclerae anicteric ENT: MMM CV: Non-tachycardic Pulm: No increased WOB GI: Soft NEURO:  Alert & Oriented   [name], MD [facility]   07/13/2023 1:59 PM

## 2023-07-13 NOTE — Progress Notes (Signed)
Vss nad trans to pacu 

## 2023-07-13 NOTE — Patient Instructions (Signed)
Discharge instructions given. Handouts on polyps,Gastritis,Diverticulosis and Hemorrhoids. Prescription sent to pharmacy. Okay to restart your Warfarin and Lovenox as instructed by your warfarin clinic. YOU HAD AN ENDOSCOPIC PROCEDURE TODAY AT THE [facility]:   Refer to the procedure report that was given to you for any specific questions about what was found during the examination.  If the procedure report does not answer your questions, please call your gastroenterologist to clarify.  If you requested that your care partner not be given the details of your procedure findings, then the procedure report has been included in a sealed envelope for you to review at your convenience later.  YOU SHOULD EXPECT: Some feelings of bloating in the abdomen. Passage of more gas than usual.  Walking can help get rid of the air that was put into your GI tract during the procedure and reduce the bloating. If you had a lower endoscopy (such as a colonoscopy or flexible sigmoidoscopy) you may notice spotting of blood in your stool or on the toilet paper. If you underwent a bowel prep for your procedure, you may not have a normal bowel movement for a few days.  Please Note:  You might notice some irritation and congestion in your nose or some drainage.  This is from the oxygen used during your procedure.  There is no need for concern and it should clear up in a day or so.  SYMPTOMS TO REPORT IMMEDIATELY:  Following lower endoscopy (colonoscopy or flexible sigmoidoscopy):  Excessive amounts of blood in the stool  Significant tenderness or worsening of abdominal pains  Swelling of the abdomen that is new, acute  Fever of 100F or higher  Following upper endoscopy (EGD)  Vomiting of blood or coffee ground material  New chest pain or pain under the shoulder blades  Painful or persistently difficult swallowing  New shortness of breath  Fever of 100F or higher  Black, tarry-looking stools  For  urgent or emergent issues, a gastroenterologist can be reached at any hour by calling [phone]. Do not use MyChart messaging for urgent concerns.    DIET:  We do recommend a small meal at first, but then you may proceed to your regular diet.  Drink plenty of fluids but you should avoid alcoholic beverages for 24 hours.  ACTIVITY:  You should plan to take it easy for the rest of today and you should NOT DRIVE or use heavy machinery until tomorrow (because of the sedation medicines used during the test).    FOLLOW UP: Our staff will call the number listed on your records the next business day following your procedure.  We will call around 7:15- 8:00 am to check on you and address any questions or concerns that you may have regarding the information given to you following your procedure. If we do not reach you, we will leave a message.     If any biopsies were taken you will be contacted by phone or by letter within the next 1-3 weeks.  Please call us at [phone] if you have not heard about the biopsies in 3 weeks.    SIGNATURES/CONFIDENTIALITY: You and/or your care partner have signed paperwork which will be entered into your electronic medical record.  These signatures attest to the fact that that the information above on your After Visit Summary has been reviewed and is understood.  Full responsibility of the confidentiality of this discharge information lies with you and/or your care-partner.

## 2023-07-14 ENCOUNTER — Telehealth: Payer: Self-pay | Admitting: *Deleted

## 2023-07-14 NOTE — Telephone Encounter (Signed)
Attempted post procedure follow up call.  No answer - unable to LVM as inbox is full.

## 2023-07-18 ENCOUNTER — Ambulatory Visit: Payer: Federal, State, Local not specified - PPO | Admitting: Family

## 2023-07-18 VITALS — BP 124/75 | HR 58 | Temp 98.8°F | Resp 16 | Ht 62.0 in | Wt 168.0 lb

## 2023-07-18 DIAGNOSIS — R195 Other fecal abnormalities: Secondary | ICD-10-CM | POA: Diagnosis not present

## 2023-07-18 DIAGNOSIS — E7800 Pure hypercholesterolemia, unspecified: Secondary | ICD-10-CM | POA: Diagnosis not present

## 2023-07-18 DIAGNOSIS — Z7901 Long term (current) use of anticoagulants: Secondary | ICD-10-CM | POA: Diagnosis not present

## 2023-07-18 DIAGNOSIS — D6861 Antiphospholipid syndrome: Secondary | ICD-10-CM | POA: Diagnosis not present

## 2023-07-18 DIAGNOSIS — I10 Essential (primary) hypertension: Secondary | ICD-10-CM

## 2023-07-18 DIAGNOSIS — M5416 Radiculopathy, lumbar region: Secondary | ICD-10-CM

## 2023-07-18 LAB — POCT INR
INR: 1.2 — AB (ref 2.0–3.0)
POC INR: 1.2

## 2023-07-18 LAB — SURGICAL PATHOLOGY

## 2023-07-18 MED ORDER — GABAPENTIN 100 MG PO CAPS
100.0000 mg | ORAL_CAPSULE | Freq: Three times a day (TID) | ORAL | 3 refills | Status: AC
Start: 2023-07-18 — End: ?

## 2023-07-18 NOTE — Progress Notes (Unsigned)
Subjective:     Patient ID: [name], female    DOB: 07/19/1975, 48 y.o.   MRN: [id number]  Chief Complaint  Patient presents with   Antiphospholipid syndrome    Here for follow up, long term use of coumadin   Hypertension    Here for follow up   Leg Pain    Complains of right upper leg pain and numbness     HPI  Discussed the use of AI scribe software for clinical note transcription with the patient, who gave verbal consent to proceed.  History of Present Illness   The patient, with a history of antiphospholipid syndrome on chronic anticoagulation therapy and recent colonoscopy, presents for a scheduled follow-up. She reports a longstanding numbness in the right lateral thigh, which has been present for a couple of years but has recently become bothersome. Over the past month, the numbness has been accompanied by a prickling sensation, general discomfort, and a burning sensation. The patient describes a sharp, shooting pain in the thigh when changing positions at night, and sensitivity to touch, such as when wearing certain types of pants or when her cat steps on her lap. The patient denies any back pain or weakness in the legs.   Since her recent colonoscopy she has been continuing a lovenox bridge and coumadin.      The patient is also on amlodipine 10 and carvedilol 25 for blood pressure management. She has been recommended pantoprazole 40 daily for eight weeks following the discovery of gastritis during her endoscopy. The patient is awaiting pathology results from biopsies taken during her colonoscopy.     Lab Results  Component Value Date   INR 1.2 (A) 07/18/2023   INR 1.2 07/18/2023   INR 3.3 (A) 07/08/2023        There are no preventive care reminders to display for this patient.  Past Medical History:  Diagnosis Date   Blood in stool    hx of   Cerebrovascular accident (CVA) (HCC) 06/20/2019   Heart murmur    Hypertension    Syncope     Past  Surgical History:  Procedure Laterality Date   BUBBLE STUDY  06/25/2019   Procedure: BUBBLE STUDY;  Surgeon: Little Ishikawa, MD;  Location: [facility] ENDOSCOPY;  Service: Endoscopy;;   TEE WITHOUT CARDIOVERSION N/A 06/25/2019   Procedure: TRANSESOPHAGEAL ECHOCARDIOGRAM (TEE);  Surgeon: Little Ishikawa, MD;  Location: [facility] ENDOSCOPY;  Service: Endoscopy;  Laterality: N/A;    Family History  Problem Relation Age of Onset   Hypertension Mother    Hypertension Father    Diabetes Father        diet controlled   Pancreatic cancer Neg Hx    Colon cancer Neg Hx    Stomach cancer Neg Hx    Esophageal cancer Neg Hx    Rectal cancer Neg Hx     Social History   Socioeconomic History   Marital status: Divorced    Spouse name: Not on file   Number of children: Not on file   Years of education: Not on file   Highest education level: Not on file  Occupational History   Occupation: respiratory therapistory therapist    Comment: [place] [employer]  Tobacco Use   Smoking status: Unknown   Smokeless tobacco: Never  Vaping Use   Vaping status: Some Days   Substances: Nicotine  Substance and Sexual Activity   Alcohol use: Yes    Comment: occ   Drug use: No  Sexual activity: Yes    Partners: Male    Birth control/protection: Condom  Other Topics Concern   Not on file  Social History Narrative   Lives at home with dog and cat   Regular exercise; yes   Works as a Buyer, retail at the [employer]   Social Drivers of Corporate investment banker Strain: Not on [employer] Insecurity: Not on file  Transportation Needs: Not on file  Physical Activity: Not on file  Stress: Not on file  Social Connections: Not on file  Intimate Partner Violence: Not on file    Outpatient Medications Prior to Visit  Medication Sig Dispense Refill   amLODipine (NORVASC) 10 MG tablet Take 1 tablet (10 mg total) by mouth daily. 90 tablet 0   aspirin EC 81 MG EC tablet Take 1 tablet (81 mg total) by  mouth daily.     atorvastatin (LIPITOR) 40 MG tablet Take 1 tablet (40 mg total) by mouth daily at 6 PM. 90 tablet 0   carvedilol (COREG) 25 MG tablet Take 1 tablet (25 mg total) by mouth 2 (two) times daily with a meal. 180 tablet 0   enoxaparin (LOVENOX) 80 MG/0.8ML injection Inject 0.8 mLs (80 mg total) into the skin every 12 (twelve) hours. As directed 20 mL 0   ferrous sulfate 325 (65 FE) MG EC tablet Take 1 tablet (325 mg total) by mouth daily with breakfast. 30 tablet 3   pantoprazole (PROTONIX) 40 MG tablet TAKE 1 TABLET BY MOUTH EVERY DAY 90 tablet 1   warfarin (COUMADIN) 7.5 MG tablet Take 1/2 tablet (3.75 mg) on Tuesday and Thursday and 1 tablet (7.5 mg) on Monday,Wednesday, Friday, Saturday and Sunday. 90 tablet 0   No facility-administered medications prior to visit.    No Known Allergies  ROS See HPI    Objective:    Physical Exam Constitutional:      General: She is not in acute distress.    Appearance: Normal appearance. She is well-developed.  HENT:     Head: Normocephalic and atraumatic.     Right Ear: External ear normal.     Left Ear: External ear normal.  Eyes:     General: No scleral icterus. Neck:     Thyroid: No thyromegaly.  Cardiovascular:     Rate and Rhythm: Normal rate and regular rhythm.     Heart sounds: Normal heart sounds. No murmur heard. Pulmonary:     Effort: Pulmonary effort is normal. No respiratory distress.     Breath sounds: Normal breath sounds. No wheezing.  Musculoskeletal:     Cervical back: Neck supple.  Skin:    General: Skin is warm and dry.  Neurological:     Mental Status: She is alert and oriented to person, place, and time.     Deep Tendon Reflexes:     Reflex Scores:      Patellar reflexes are 2+ on the right side and 2+ on the left side.    Comments: Bilateral LE strength is 5/5   Psychiatric:        Mood and Affect: Mood normal.        Behavior: Behavior normal.        Thought Content: Thought content normal.         Judgment: Judgment normal.      BP 124/75 (BP Location: Right Arm, Patient Position: Sitting, Cuff Size: Normal)   Pulse (!) 58   Temp 98.8 F (37.1 C) (Oral)  Resp 16   Ht 5\' 2"  (1.575 m)   Wt 168 lb (76.2 kg)   LMP 07/05/2023 (Exact Date)   SpO2 99%   BMI 30.73 kg/m  Wt Readings from Last 3 Encounters:  07/18/23 168 lb (76.2 kg)  07/13/23 170 lb (77.1 kg)  06/02/23 170 lb (77.1 kg)       Assessment & Plan:   Problem List Items Addressed This Visit       Unprioritized   Lumbar radiculopathy   Mainly having paresthesia.   Chronic numbness with recent onset of burning, prickling sensation, and shooting pain, likely due to nerve impingement. Discussed the risks/benefits of various treatment options including anti-inflammatories (contraindicated due to Coumadin use), physical therapy, gabapentin, and chiropractic care. -Start Gabapentin, dosing up to three times a day as needed for symptom management, with the most important dose at bedtime. -Consider physical therapy or chiropractic care if Gabapentin does not provide relief. Would try to avoid invasive measures such as ESI given her hx and anticoagulation if possible.        Relevant Medications   gabapentin (NEURONTIN) 100 MG capsule   Hyperlipidemia   Lab Results  Component Value Date   CHOL 186 05/18/2023   HDL 39.20 05/18/2023   LDLCALC 136 (H) 05/18/2023   TRIG 54.0 05/18/2023   CHOLHDL 5 05/18/2023   Continues atorvastatin, plan to repeat lipid panel next visit.      HTN (hypertension)   BP at goal, continue amlodipine and carvedilol.       Heme positive stool   Likely secondary to gastritis noted on recent endo.  Being treated with pantoprazole.       Antiphospholipid syndrome (HCC)   INR sub therapeutic.  Continue coumadin and lovenox bridge, repeat INR on Friday of this week.        Other Visit Diagnoses       Long term (current) use of anticoagulants    -  Primary   Relevant Orders    POCT INR (Completed)       I am having Orpah Clinton start on gabapentin. I am also having her maintain her aspirin EC, ferrous sulfate, atorvastatin, amLODipine, carvedilol, warfarin, enoxaparin, and pantoprazole.  Meds ordered this encounter  Medications   gabapentin (NEURONTIN) 100 MG capsule    Sig: Take 1 capsule (100 mg total) by mouth 3 (three) times daily.    Dispense:  90 capsule    Refill:  3    Supervising Provider:   Danise Edge A [[id number]]

## 2023-07-19 ENCOUNTER — Encounter: Payer: Self-pay | Admitting: Internal Medicine

## 2023-07-19 DIAGNOSIS — M5416 Radiculopathy, lumbar region: Secondary | ICD-10-CM | POA: Insufficient documentation

## 2023-07-19 NOTE — Assessment & Plan Note (Signed)
Lab Results  Component Value Date   CHOL 186 05/18/2023   HDL 39.20 05/18/2023   LDLCALC 136 (H) 05/18/2023   TRIG 54.0 05/18/2023   CHOLHDL 5 05/18/2023   Continues atorvastatin, plan to repeat lipid panel next visit.

## 2023-07-19 NOTE — Assessment & Plan Note (Signed)
Mainly having paresthesia.   Chronic numbness with recent onset of burning, prickling sensation, and shooting pain, likely due to nerve impingement. Discussed the risks/benefits of various treatment options including anti-inflammatories (contraindicated due to Coumadin use), physical therapy, gabapentin, and chiropractic care. -Start Gabapentin, dosing up to three times a day as needed for symptom management, with the most important dose at bedtime. -Consider physical therapy or chiropractic care if Gabapentin does not provide relief. Would try to avoid invasive measures such as ESI given her hx and anticoagulation if possible.

## 2023-07-19 NOTE — Assessment & Plan Note (Addendum)
BP at goal, continue amlodipine and carvedilol.

## 2023-07-19 NOTE — Assessment & Plan Note (Signed)
Likely secondary to gastritis noted on recent endo.  Being treated with pantoprazole.

## 2023-07-19 NOTE — Assessment & Plan Note (Signed)
INR sub therapeutic.  Continue coumadin and lovenox bridge, repeat INR on Friday of this week.

## 2023-07-19 NOTE — Patient Instructions (Signed)
VISIT SUMMARY:  You came in today for a follow-up appointment. We discussed your ongoing right leg numbness and pain, your anticoagulation therapy, and your recent diagnosis of gastritis. We have made some adjustments to your treatment plan to help manage your symptoms and improve your overall health.  YOUR PLAN:  -RIGHT-SIDED LEG PAIN: Your right leg pain and numbness are likely due to a nerve issue. We will start you on Gabapentin, which you can take up to three times a day as needed, with the most important dose at bedtime. If Gabapentin does not help, we may consider physical therapy or chiropractic care.  -ANTICOAGULATION MANAGEMENT: You are currently on Coumadin and Lovenox following your recent colonoscopy. We will check your INR today to determine when you can stop taking Lovenox. Continue taking Coumadin 7.5mg  with half a tablet on Tuesday and Thursday, and a full tablet on the other days.  -GASTRITIS: Gastritis is an inflammation of the stomach lining. Continue taking Pantoprazole 40mg  daily for 8 weeks to help reduce stomach acid and allow your stomach lining to heal.  INSTRUCTIONS:  Please follow up in 3 months for a general check-up. Additionally, continue with regular Coumadin follow-ups to monitor your anticoagulation therapy.

## 2023-07-22 ENCOUNTER — Ambulatory Visit (INDEPENDENT_AMBULATORY_CARE_PROVIDER_SITE_OTHER): Payer: Federal, State, Local not specified - PPO

## 2023-07-22 DIAGNOSIS — Z7901 Long term (current) use of anticoagulants: Secondary | ICD-10-CM | POA: Diagnosis not present

## 2023-07-22 LAB — POCT INR
INR: 2.5 (ref 2.0–3.0)
POC INR: 2.5

## 2023-07-22 NOTE — Progress Notes (Signed)
 Pt here for INR check per  Goal INR =2.0 to 3.0  Last INR =1.2 on 07/18/23 while doing levonox bridge due to having colonoscopy.   Pt currently takes Coumadin 7.5 for 5 days a week and 3.75 two days a week.   Pt denies recent antibiotics, no dietary changes and no unusual bruising / bleeding.  INR today = 2.5   Pt advised per provider, she can discontinue Levonox and continue to take current dose of Coumadin. Recheck INR in 2 weeks.

## 2023-08-05 ENCOUNTER — Ambulatory Visit: Payer: Federal, State, Local not specified - PPO | Admitting: *Deleted

## 2023-08-05 DIAGNOSIS — Z7901 Long term (current) use of anticoagulants: Secondary | ICD-10-CM | POA: Diagnosis not present

## 2023-08-05 LAB — POCT INR: POC INR: 2.5

## 2023-08-05 NOTE — Progress Notes (Signed)
Pt here for INR check per PCP   Goal INR =2.0 to 3.0   Last INR = 2.5  Pt currently takes Coumadin 7.5 for 5 days a week and 3.75 two days a week.    Pt denies recent antibiotics, no dietary changes and no unusual bruising / bleeding.   INR today = 2.5     Pt advised per PCP to repeat INR in 4 weeks.

## 2023-08-12 ENCOUNTER — Other Ambulatory Visit: Payer: Self-pay | Admitting: Family

## 2023-08-12 DIAGNOSIS — D6861 Antiphospholipid syndrome: Secondary | ICD-10-CM

## 2023-08-12 MED ORDER — WARFARIN SODIUM 7.5 MG PO TABS: ORAL_TABLET | ORAL | 0 refills | Status: DC

## 2023-08-12 MED ORDER — AMLODIPINE BESYLATE 10 MG PO TABS: 10.0000 mg | ORAL_TABLET | Freq: Every day | ORAL | 0 refills | Status: DC

## 2023-08-12 NOTE — Telephone Encounter (Signed)
Copied from CRM [id number]. Topic: Clinical - Medication Refill >> Aug 12, 2023  2:21 PM Almira Coaster wrote: Most Recent Primary Care Visit:  Provider: Juel Burrow  Department: [facility]  Visit Type: CLINICAL SUPPORT  Date: 08/05/2023  Medication: warfarin (COUMADIN) 7.5 MG tablet   Has the patient contacted their pharmacy? Yes, patient sent a mychart request but is unable to request it online.  (Agent: If no, request that the patient contact the pharmacy for the refill. If patient does not wish to contact the pharmacy document the reason why and proceed with request.) (Agent: If yes, when and what did the pharmacy advise?)  Is this the correct pharmacy for this prescription? Yes If no, delete pharmacy and type the correct one.  This is the patient's preferred pharmacy:  [id number] [place], [place] - [phone] AT [facility] & [address] [place] [address] Phone: [phone] Fax: [phone]    Has the prescription been filled recently? No  Is the patient out of the medication? No  Has the patient been seen for an appointment in the last year OR does the patient have an upcoming appointment? Yes  Can we respond through MyChart? Yes  Agent: Please be advised that Rx refills may take up to 3 business days. We ask that you follow-up with your pharmacy.

## 2023-09-06 ENCOUNTER — Ambulatory Visit (INDEPENDENT_AMBULATORY_CARE_PROVIDER_SITE_OTHER): Payer: Federal, State, Local not specified - PPO | Admitting: *Deleted

## 2023-09-06 ENCOUNTER — Ambulatory Visit: Payer: Federal, State, Local not specified - PPO

## 2023-09-06 DIAGNOSIS — Z7901 Long term (current) use of anticoagulants: Secondary | ICD-10-CM

## 2023-09-06 LAB — POCT INR: INR: 2.9 (ref 2.0–3.0)

## 2023-09-06 NOTE — Progress Notes (Signed)
 Pt here for INR check per PCP   Goal INR =2.0 to 3.0   Last INR = 2.5  Pt currently takes Coumadin 7.5 for 5 days a week and 3.75 two days a week.    Pt denies recent antibiotics, no dietary changes and no unusual bruising / bleeding.   INR today = 2.9    Pt advised per PCP continue same regimen and recheck in one month.  Pt scheduled for 10/11/23

## 2023-09-13 ENCOUNTER — Other Ambulatory Visit: Payer: Self-pay | Admitting: Family

## 2023-09-14 ENCOUNTER — Other Ambulatory Visit: Payer: Self-pay | Admitting: Family

## 2023-09-21 ENCOUNTER — Other Ambulatory Visit: Payer: Self-pay | Admitting: Hematology and Oncology

## 2023-09-21 ENCOUNTER — Inpatient Hospital Stay: Payer: Federal, State, Local not specified - PPO | Attending: Hematology and Oncology

## 2023-09-21 DIAGNOSIS — Z7901 Long term (current) use of anticoagulants: Secondary | ICD-10-CM | POA: Diagnosis not present

## 2023-09-21 DIAGNOSIS — D500 Iron deficiency anemia secondary to blood loss (chronic): Secondary | ICD-10-CM | POA: Insufficient documentation

## 2023-09-21 DIAGNOSIS — D6861 Antiphospholipid syndrome: Secondary | ICD-10-CM | POA: Insufficient documentation

## 2023-09-21 DIAGNOSIS — F17290 Nicotine dependence, other tobacco product, uncomplicated: Secondary | ICD-10-CM | POA: Insufficient documentation

## 2023-09-21 DIAGNOSIS — Z8673 Personal history of transient ischemic attack (TIA), and cerebral infarction without residual deficits: Secondary | ICD-10-CM | POA: Insufficient documentation

## 2023-09-21 DIAGNOSIS — N920 Excessive and frequent menstruation with regular cycle: Secondary | ICD-10-CM | POA: Diagnosis not present

## 2023-09-21 LAB — CMP (CANCER CENTER ONLY)
ALT: 9 U/L (ref 0–44)
AST: 12 U/L — ABNORMAL LOW (ref 15–41)
Albumin: 4.2 g/dL (ref 3.5–5.0)
Alkaline Phosphatase: 53 U/L (ref 38–126)
Anion gap: 6 (ref 5–15)
BUN: 13 mg/dL (ref 6–20)
CO2: 28 mmol/L (ref 22–32)
Calcium: 9.2 mg/dL (ref 8.9–10.3)
Chloride: 104 mmol/L (ref 98–111)
Creatinine: 1.01 mg/dL — ABNORMAL HIGH (ref 0.44–1.00)
GFR, Estimated: >60 mL/min (ref >60)
Glucose, Bld: 118 mg/dL — ABNORMAL HIGH (ref 70–99)
Potassium: 3.9 mmol/L (ref 3.5–5.1)
Sodium: 138 mmol/L (ref 135–145)
Total Bilirubin: 0.6 mg/dL (ref 0.0–1.2)
Total Protein: 7.7 g/dL (ref 6.5–8.1)

## 2023-09-21 LAB — CBC WITH DIFFERENTIAL (CANCER CENTER ONLY)
Abs Immature Granulocytes: 0.01 10*3/uL (ref 0.00–0.07)
Basophils Absolute: 0 10*3/uL (ref 0.0–0.1)
Basophils Relative: 0 %
Eosinophils Absolute: 0 10*3/uL (ref 0.0–0.5)
Eosinophils Relative: 1 %
HCT: 35.9 % — ABNORMAL LOW (ref 36.0–46.0)
Hemoglobin: 11.7 g/dL — ABNORMAL LOW (ref 12.0–15.0)
Immature Granulocytes: 0 %
Lymphocytes Relative: 43 %
Lymphs Abs: 1.6 10*3/uL (ref 0.7–4.0)
MCH: 29.2 pg (ref 26.0–34.0)
MCHC: 32.6 g/dL (ref 30.0–36.0)
MCV: 89.5 fL (ref 80.0–100.0)
Monocytes Absolute: 0.3 10*3/uL (ref 0.1–1.0)
Monocytes Relative: 7 %
Neutro Abs: 1.8 10*3/uL (ref 1.7–7.7)
Neutrophils Relative %: 49 %
Platelet Count: 574 10*3/uL — ABNORMAL HIGH (ref 150–400)
RBC: 4.01 MIL/uL (ref 3.87–5.11)
RDW: 14.3 % (ref 11.5–15.5)
WBC Count: 3.7 10*3/uL — ABNORMAL LOW (ref 4.0–10.5)
nRBC: 0 % (ref 0.0–0.2)

## 2023-09-21 LAB — RETIC PANEL
Immature Retic Fract: 9.1 % (ref 2.3–15.9)
RBC.: 4.04 MIL/uL (ref 3.87–5.11)
Retic Count, Absolute: 63.8 10*3/uL (ref 19.0–186.0)
Retic Ct Pct: 1.6 % (ref 0.4–3.1)
Reticulocyte Hemoglobin: 33.4 pg (ref >27.9)

## 2023-09-21 LAB — IRON AND IRON BINDING CAPACITY (CC-WL,HP ONLY)
Iron: 70 ug/dL (ref 28–170)
Saturation Ratios: 22 % (ref 10.4–31.8)
TIBC: 321 ug/dL (ref 250–450)
UIBC: 251 ug/dL (ref 148–442)

## 2023-09-21 LAB — FERRITIN: Ferritin: 27 ng/mL (ref 11–307)

## 2023-09-28 ENCOUNTER — Other Ambulatory Visit: Payer: Self-pay | Admitting: Hematology and Oncology

## 2023-09-28 ENCOUNTER — Inpatient Hospital Stay: Payer: Federal, State, Local not specified - PPO | Admitting: Hematology and Oncology

## 2023-09-28 VITALS — BP 141/104 | HR 63 | Temp 97.4°F | Resp 13 | Wt 169.9 lb

## 2023-09-28 DIAGNOSIS — D6861 Antiphospholipid syndrome: Secondary | ICD-10-CM | POA: Diagnosis not present

## 2023-09-28 DIAGNOSIS — F17290 Nicotine dependence, other tobacco product, uncomplicated: Secondary | ICD-10-CM | POA: Diagnosis not present

## 2023-09-28 DIAGNOSIS — D500 Iron deficiency anemia secondary to blood loss (chronic): Secondary | ICD-10-CM | POA: Diagnosis not present

## 2023-09-28 DIAGNOSIS — Z8673 Personal history of transient ischemic attack (TIA), and cerebral infarction without residual deficits: Secondary | ICD-10-CM | POA: Diagnosis not present

## 2023-09-28 DIAGNOSIS — Z7901 Long term (current) use of anticoagulants: Secondary | ICD-10-CM | POA: Diagnosis not present

## 2023-09-28 DIAGNOSIS — N920 Excessive and frequent menstruation with regular cycle: Secondary | ICD-10-CM | POA: Diagnosis not present

## 2023-09-28 NOTE — Progress Notes (Signed)
 [name] [phone]   [phone]  PROGRESS NOTE  Patient Care Team: [name], NP as PCP - General (Internal Medicine)  Hematological/Oncological History # Positive APS Antibodies in Setting of CVA 1) 06/20/2019: Left MCA stroke. Started on aspirin therapy 81mg  daily.   2) 06/21/2019: Beta-2-glycoprotein IgG 65, Anticardiolipin IgG 66 3) 10/04/2019: as part of neurology workup, patient underwent APS testing. Found to have Anticardiolipin IgG elevated at 79, Beta 2 Glycoprotein IgG elevated at 88.  4) 11/14/2019: establish care with Dr. Leonides Schanz    Phyllis History:  Phyllis Hopkins 48 y.o. female with medical history significant for antiphospholipid antibody syndrome with CVA presents for a follow up visit. The patient's last visit was on 02/23/2022. In the interim since the last visit the patient has been continued on systemic anticoagulation with coumadin therapy.   On exam today [name] notes she has been well overall in the interim since our last visit.  She has had no major changes in her health with no hospitalizations or ER visits.  She started the new medications.  She reports that she is taking her iron pills faithfully and they are not causing any stomach upset or constipation.  She reports that she is drinking apple juice and plenty of water to keep her bowels moving.  She notes her energy levels are about 7 out of 10 today.  She is not having any lightheadedness, dizziness, shortness of breath.  She is taking her warfarin as prescribed.  Her INR has been within therapeutic range.  She is not having any signs or symptoms concerning for bleeding, bruising, or dark stools.  We offered her IV iron today but she declined and wanted to continue with p.o. iron therapy.  She is not having any signs or symptoms concerning for a recurrent clot/stroke.  Overall she feels well and has no questions comments or concerns today.  She currently  denies any fevers, chills, sweats, nausea, vomiting or diarrhea.  A full 10 point ROS is listed below  MEDICAL HISTORY:  Past Medical History:  Diagnosis Date   Blood in stool    hx of   Cerebrovascular accident (CVA) (HCC) 06/20/2019   Heart murmur    Hypertension    Syncope     SURGICAL HISTORY: Past Surgical History:  Procedure Laterality Date   BUBBLE STUDY  06/25/2019   Procedure: BUBBLE STUDY;  Surgeon: Little Ishikawa, MD;  Location: [facility] ENDOSCOPY;  Service: Endoscopy;;   TEE WITHOUT CARDIOVERSION N/A 06/25/2019   Procedure: TRANSESOPHAGEAL ECHOCARDIOGRAM (TEE);  Surgeon: Little Ishikawa, MD;  Location: [facility] ENDOSCOPY;  Service: Endoscopy;  Laterality: N/A;    SOCIAL HISTORY: Social History   Socioeconomic History   Marital status: Divorced    Spouse name: Not on file   Number of children: Not on file   Years of education: Not on file   Highest education level: Not on file  Occupational History   Occupation: respiratory therapistory therapist    Comment: [place] [employer]  Tobacco Use   Smoking status: Unknown   Smokeless tobacco: Never  Vaping Use   Vaping status: Some Days   Substances: Nicotine  Substance and Sexual Activity   Alcohol use: Yes    Comment: occ   Drug use: No   Sexual activity: Yes    Partners: Male    Birth control/protection: Condom  Other Topics Concern   Not on file  Social History Narrative   Lives at home with dog and  cat   Regular exercise; yes   Works as a Buyer, retail at the [employer]   Social Drivers of Corporate investment banker Strain: Not on [employer] Insecurity: Not on file  Transportation Needs: Not on file  Physical Activity: Not on file  Stress: Not on file  Social Connections: Not on file  Intimate Partner Violence: Not on file    FAMILY HISTORY: Family History  Problem Relation Age of Onset   Hypertension Mother    Hypertension Father    Diabetes Father        diet controlled   Pancreatic  cancer Neg Hx    Colon cancer Neg Hx    Stomach cancer Neg Hx    Esophageal cancer Neg Hx    Rectal cancer Neg Hx     ALLERGIES:  has no known allergies.  MEDICATIONS:  Current Outpatient Medications  Medication Sig Dispense Refill   amLODipine (NORVASC) 10 MG tablet Take 1 tablet (10 mg total) by mouth daily. 90 tablet 0   aspirin EC 81 MG EC tablet Take 1 tablet (81 mg total) by mouth daily.     atorvastatin (LIPITOR) 40 MG tablet TAKE 1 TABLET(40 MG) BY MOUTH DAILY AT 6 PM 90 tablet 0   carvedilol (COREG) 25 MG tablet Take 1 tablet (25 mg total) by mouth 2 (two) times daily with a meal. 180 tablet 0   enoxaparin (LOVENOX) 80 MG/0.8ML injection Inject 0.8 mLs (80 mg total) into the skin every 12 (twelve) hours. As directed 20 mL 0   ferrous sulfate 325 (65 FE) MG EC tablet Take 1 tablet (325 mg total) by mouth daily with breakfast. 30 tablet 3   gabapentin (NEURONTIN) 100 MG capsule Take 1 capsule (100 mg total) by mouth 3 (three) times daily. 90 capsule 3   pantoprazole (PROTONIX) 40 MG tablet TAKE 1 TABLET BY MOUTH EVERY DAY 90 tablet 1   warfarin (COUMADIN) 7.5 MG tablet Take 1/2 tablet (3.75 mg) on Tuesday and Thursday and 1 tablet (7.5 mg) on Monday,Wednesday, Friday, Saturday and Sunday. 90 tablet 0   No current facility-administered medications for this visit.    REVIEW OF SYSTEMS:   Constitutional: ( - ) fevers, ( - )  chills , ( - ) night sweats Eyes: ( - ) blurriness of vision, ( - ) double vision, ( - ) watery eyes Ears, nose, mouth, throat, and face: ( - ) mucositis, ( - ) sore throat Respiratory: ( - ) cough, ( - ) dyspnea, ( - ) wheezes Cardiovascular: ( - ) palpitation, ( - ) chest discomfort, ( - ) lower extremity swelling Gastrointestinal:  ( - ) nausea, ( - ) heartburn, ( - ) change in bowel habits Skin: ( - ) abnormal skin rashes Lymphatics: ( - ) new lymphadenopathy, ( - ) easy bruising Neurological: ( - ) numbness, ( - ) tingling, ( - ) new  weaknesses Behavioral/Psych: ( - ) mood change, ( - ) new changes  All other systems were reviewed with the patient and are negative.  PHYSICAL EXAMINATION: ECOG PERFORMANCE STATUS: 0 - Asymptomatic  Vitals:   09/28/23 1150 09/28/23 1211  BP: (!) 153/104 (!) 141/104  Pulse: 63   Resp: 13   Temp: (!) 97.4 F (36.3 C)   SpO2: 100%      Filed Weights   09/28/23 1150  Weight: 169 lb 14.4 oz (77.1 kg)       GENERAL: well appearing middle aged African  American female, alert, no distress and comfortable SKIN: skin color, texture, turgor are normal, no rashes or significant lesions EYES: conjunctiva are pink and non-injected, sclera clear LUNGS: clear to auscultation and percussion with normal breathing effort HEART: regular rate & rhythm and no murmurs and no lower extremity edema Musculoskeletal: no cyanosis of digits and no clubbing  PSYCH: alert & oriented x 3, fluent speech NEURO: no focal motor/sensory deficits  LABORATORY DATA:  I have reviewed the data as listed    Latest Ref Rng & Units 09/21/2023   12:10 PM 03/28/2023    2:57 PM 03/09/2023   11:53 AM  CBC  WBC 4.0 - 10.5 K/uL 3.7  5.0  5.8   Hemoglobin 12.0 - 15.0 g/dL 16.1  09.6  04.5   Hematocrit 36.0 - 46.0 % 35.9  38.0  34.3   Platelets 150 - 400 K/uL 574  595  673.0        Latest Ref Rng & Units 09/21/2023   12:10 PM 03/28/2023    2:57 PM 01/26/2023   12:32 PM  CMP  Glucose 70 - 99 mg/dL 409  82  83   BUN 6 - 20 mg/dL 13  14  15    Creatinine 0.44 - 1.00 mg/dL 8.11  9.14  7.82   Sodium 135 - 145 mmol/L 138  139  136   Potassium 3.5 - 5.1 mmol/L 3.9  3.8  4.4   Chloride 98 - 111 mmol/L 104  108  103   CO2 22 - 32 mmol/L 28  23  25    Calcium 8.9 - 10.3 mg/dL 9.2  9.5  9.4   Total Protein 6.5 - 8.1 g/dL 7.7  8.0    Total Bilirubin 0.0 - 1.2 mg/dL 0.6  0.5    Alkaline Phos 38 - 126 U/L 53  49    AST 15 - 41 U/L 12  12    ALT 0 - 44 U/L 9  8      RADIOGRAPHIC STUDIES: No results found.  ASSESSMENT &  PLAN Phyllis Hopkins 48 y.o. female with medical history significant for antiphospholipid antibody syndrome with CVA presents for a follow up visit.  Her review of the labs, discussion with the patient her findings are most consistent with antiphospholipid antibody syndrome as well as an unrelated iron deficiency anemia secondary to GYN blood loss.    She has continued on coumadin therapy and has tolerated it well.  Additionally her hemoglobin has improved from prior.  Her serum iron levels do appear to be improving at her last visit.  Our plan will be to have the patient continue to follow with the Coumadin clinic as well as return to our clinic in 6 months time in order to assure that she has stable iron levels.  # Positive APS Antibodies in Setting of CVA # Antiphospholipid Antibody Syndrome --Patient currently meets clinical/laboratory criteria and the diagnosis of APS was confirmed with repeat testing of antibodies in 12 weeks time. --after CVA with APS it is recommended that patients be on lifelong therapy with coumadin. DOACs are inferior to coumadin in the setting of APS --continue with coumadin clinic through her primary care. Unfortunately we do no offer coumadin clinic services at the [facility]. She currently follows with another coumadin clinic.  --RTC in 6 months to assure patient is stable on coumadin and to assess her iron deficiency anemia.    #Iron Deficiency Anemia 2/2 to Blood Loss --previously  reportedheavy menstrual cycles as cause of blood loss/iron deficiency --repeat iron panel for the patient today --Labs today show white blood cell count 3.7, Hgb 11.7, MCV 89.5 Plt 574 --continue ferrous sulfate 325mg  PO daily with a source of vitamin C --if unable to maintain Hgb with PO therapy will consider IV iron therapy instead.  IV iron was offered today but the patient declined.  She would like to continue p.o. therapy. --continue to monitor  No  orders of the defined types were placed in this encounter.   All questions were answered. The patient knows to call the clinic with any problems, questions or concerns.  A total of more than 30 minutes were spent on this encounter and over half of that time was spent on counseling and coordination of care as outlined above.   Ulysees Barns, MD Department of Hematology/Oncology [facility] Phone: [phone] Pager: [phone] Email: [email or web address]  09/28/2023 5:41 PM

## 2023-10-11 ENCOUNTER — Ambulatory Visit

## 2023-10-14 ENCOUNTER — Ambulatory Visit

## 2023-10-14 DIAGNOSIS — Z7901 Long term (current) use of anticoagulants: Secondary | ICD-10-CM | POA: Diagnosis not present

## 2023-10-14 LAB — POCT INR: POC INR: 2

## 2023-10-14 NOTE — Progress Notes (Signed)
 Pt here for INR check per PCP  Goal INR =2.0 to 3.0  Last INR = 2.9 Pt currently takes Coumadin  7.5 for 5 days a week and 3.75 two days a week.  Pt denies recent antibiotics, no dietary changes and no unusual bruising / bleeding.  INR today = 2.0  Pt advised per PCP continue same regimen and recheck in one month.

## 2023-11-09 ENCOUNTER — Ambulatory Visit (INDEPENDENT_AMBULATORY_CARE_PROVIDER_SITE_OTHER)

## 2023-11-09 DIAGNOSIS — Z7901 Long term (current) use of anticoagulants: Secondary | ICD-10-CM | POA: Diagnosis not present

## 2023-11-09 LAB — POCT INR: INR: 1.7 — AB (ref 2.0–3.0)

## 2023-11-09 NOTE — Progress Notes (Signed)
 Pt here for INR check per PCP  Goal INR = 2.0---3.0  Last INR = 2.0  Pt currently takes Coumadin  7.5 for 5 days a week and 3.75 two days a week.    **Pt states she forgot the Sunday dose  Pt denies recent antibiotics, no dietary changes and no unusual bruising / bleeding.  INR today = 1.7  Pt advised per PCP to continue current regime w/o skipping doses and to return in 1 week. Pt scheduled.

## 2023-11-17 ENCOUNTER — Other Ambulatory Visit: Payer: Self-pay | Admitting: Family

## 2023-11-18 ENCOUNTER — Ambulatory Visit (INDEPENDENT_AMBULATORY_CARE_PROVIDER_SITE_OTHER): Admitting: *Deleted

## 2023-11-18 ENCOUNTER — Other Ambulatory Visit (HOSPITAL_COMMUNITY): Payer: Self-pay

## 2023-11-18 ENCOUNTER — Telehealth: Payer: Self-pay

## 2023-11-18 DIAGNOSIS — Z7901 Long term (current) use of anticoagulants: Secondary | ICD-10-CM | POA: Diagnosis not present

## 2023-11-18 DIAGNOSIS — D6861 Antiphospholipid syndrome: Secondary | ICD-10-CM | POA: Diagnosis not present

## 2023-11-18 LAB — POCT INR: INR: 2.7 (ref 2.0–3.0)

## 2023-11-18 MED ORDER — WARFARIN SODIUM 7.5 MG PO TABS
ORAL_TABLET | ORAL | 1 refills | Status: AC
Start: 2023-11-18 — End: ?

## 2023-11-18 NOTE — Telephone Encounter (Signed)
 Pharmacy Patient Advocate Encounter   Received notification from CoverMyMeds that prior authorization for Pantoprazole  Sodium 40MG  dr tablets is required/requested.   Insurance verification completed.   The patient is insured through [facility] .   Per test claim:  Noted that patient was to take an 8 week course.

## 2023-11-18 NOTE — Progress Notes (Addendum)
 Pt here for INR check per PCP   Goal INR = 2.0---3.0   Last INR = 1.7   Pt currently takes Coumadin  7.5 for 5 days a week and 3.75 two days a week.    Pt denies recent antibiotics, no dietary changes and no unusual bruising / bleeding.   INR today = 2.7   Pt advised per PCP continue same dose and repeat in one month.  Next appt scheduled.

## 2023-11-18 NOTE — Addendum Note (Signed)
 Addended by: Marylou Sobers D on: 11/18/2023 12:00 PM   Modules accepted: Orders

## 2023-12-20 ENCOUNTER — Ambulatory Visit: Admitting: *Deleted

## 2023-12-20 DIAGNOSIS — Z7901 Long term (current) use of anticoagulants: Secondary | ICD-10-CM

## 2023-12-20 LAB — POCT INR: INR: 1.7 — AB (ref 2.0–3.0)

## 2023-12-20 NOTE — Progress Notes (Signed)
 Pt here for INR check per PCP   Goal INR = 2.0---3.0   Last INR = 2.7   Pt currently takes Coumadin  7.5 for 5 days a week and 3.75 two days a week.   Pt in the past month has missed about 2-3 doses.   Pt denies recent antibiotics, no dietary changes and no unusual bruising / bleeding.   INR today = 1.7  Pt advised per Melissa to continue same regimen and recheck in 1 week.  Pt scheduled for next Friday due to work.

## 2023-12-30 ENCOUNTER — Ambulatory Visit (INDEPENDENT_AMBULATORY_CARE_PROVIDER_SITE_OTHER): Admitting: *Deleted

## 2023-12-30 DIAGNOSIS — Z7901 Long term (current) use of anticoagulants: Secondary | ICD-10-CM

## 2023-12-30 LAB — POCT INR: INR: 1.6 — AB (ref 2.0–3.0)

## 2023-12-30 NOTE — Progress Notes (Signed)
 Pt here for INR check per PCP   Goal INR = 2.0---3.0   Last INR = 1.7   Pt currently takes Coumadin  7.5 for 5 days a week and 3.75 2 days a week.   Pt denies any missed doses.   Pt denies recent antibiotics, no dietary changes and no unusual bruising / bleeding.   INR today = 1.6  Pt advised per Melissa to change to 7.5mg  6 days a week and 3.75mg  one day a week and recheck in 2 weeks.    Pt will be out of town but scheduled for 01/18/24.

## 2024-01-18 ENCOUNTER — Ambulatory Visit (INDEPENDENT_AMBULATORY_CARE_PROVIDER_SITE_OTHER)

## 2024-01-18 DIAGNOSIS — Z7901 Long term (current) use of anticoagulants: Secondary | ICD-10-CM

## 2024-01-18 LAB — POCT INR: INR: 1.6 — AB (ref 2.0–3.0)

## 2024-01-18 NOTE — Progress Notes (Signed)
 Pt here for INR check per Melissa  Goal INR = 2.0--3.0  Last INR = 1.6  Pt currently takes Coumadin  7.5mg  6 days a week and 3.75mg  one day a week. Pt states it is likely she missed a few dose as she was in [place] for her [place]   Pt denies recent antibiotics, no dietary changes and no unusual bruising / bleeding.  INR today = 1.6  Pt advised per Melissa to continue current regime and recheck in 1 week.

## 2024-01-23 ENCOUNTER — Other Ambulatory Visit: Payer: Self-pay | Admitting: Family

## 2024-01-25 ENCOUNTER — Ambulatory Visit

## 2024-01-27 ENCOUNTER — Encounter: Payer: Self-pay | Admitting: Family

## 2024-01-27 DIAGNOSIS — I10 Essential (primary) hypertension: Secondary | ICD-10-CM

## 2024-01-27 MED ORDER — CARVEDILOL 25 MG PO TABS: 25.0000 mg | ORAL_TABLET | Freq: Two times a day (BID) | ORAL | 0 refills | Status: AC

## 2024-03-22 ENCOUNTER — Inpatient Hospital Stay: Attending: Hematology and Oncology

## 2024-03-22 DIAGNOSIS — D6861 Antiphospholipid syndrome: Secondary | ICD-10-CM | POA: Diagnosis not present

## 2024-03-22 DIAGNOSIS — F17290 Nicotine dependence, other tobacco product, uncomplicated: Secondary | ICD-10-CM | POA: Insufficient documentation

## 2024-03-22 DIAGNOSIS — D509 Iron deficiency anemia, unspecified: Secondary | ICD-10-CM | POA: Diagnosis not present

## 2024-03-22 DIAGNOSIS — Z7901 Long term (current) use of anticoagulants: Secondary | ICD-10-CM | POA: Insufficient documentation

## 2024-03-22 DIAGNOSIS — D500 Iron deficiency anemia secondary to blood loss (chronic): Secondary | ICD-10-CM

## 2024-03-22 DIAGNOSIS — Z8673 Personal history of transient ischemic attack (TIA), and cerebral infarction without residual deficits: Secondary | ICD-10-CM | POA: Diagnosis not present

## 2024-03-22 LAB — CMP (CANCER CENTER ONLY)
ALT: 8 U/L (ref 0–44)
AST: 12 U/L — ABNORMAL LOW (ref 15–41)
Albumin: 4.1 g/dL (ref 3.5–5.0)
Alkaline Phosphatase: 45 U/L (ref 38–126)
Anion gap: 5 (ref 5–15)
BUN: 14 mg/dL (ref 6–20)
CO2: 26 mmol/L (ref 22–32)
Calcium: 9.4 mg/dL (ref 8.9–10.3)
Chloride: 105 mmol/L (ref 98–111)
Creatinine: 0.95 mg/dL (ref 0.44–1.00)
GFR, Estimated: >60 mL/min (ref >60)
Glucose, Bld: 93 mg/dL (ref 70–99)
Potassium: 4.1 mmol/L (ref 3.5–5.1)
Sodium: 136 mmol/L (ref 135–145)
Total Bilirubin: 0.5 mg/dL (ref 0.0–1.2)
Total Protein: 7.9 g/dL (ref 6.5–8.1)

## 2024-03-22 LAB — CBC WITH DIFFERENTIAL (CANCER CENTER ONLY)
Abs Immature Granulocytes: 0.01 K/uL (ref 0.00–0.07)
Basophils Absolute: 0 K/uL (ref 0.0–0.1)
Basophils Relative: 1 %
Eosinophils Absolute: 0 K/uL (ref 0.0–0.5)
Eosinophils Relative: 1 %
HCT: 37.3 % (ref 36.0–46.0)
Hemoglobin: 12.6 g/dL (ref 12.0–15.0)
Immature Granulocytes: 0 %
Lymphocytes Relative: 45 %
Lymphs Abs: 1.8 K/uL (ref 0.7–4.0)
MCH: 29.7 pg (ref 26.0–34.0)
MCHC: 33.8 g/dL (ref 30.0–36.0)
MCV: 88 fL (ref 80.0–100.0)
Monocytes Absolute: 0.3 K/uL (ref 0.1–1.0)
Monocytes Relative: 8 %
Neutro Abs: 1.9 K/uL (ref 1.7–7.7)
Neutrophils Relative %: 45 %
Platelet Count: 551 K/uL — ABNORMAL HIGH (ref 150–400)
RBC: 4.24 MIL/uL (ref 3.87–5.11)
RDW: 14.6 % (ref 11.5–15.5)
WBC Count: 4.1 K/uL (ref 4.0–10.5)
nRBC: 0 % (ref 0.0–0.2)

## 2024-03-22 LAB — IRON AND IRON BINDING CAPACITY (CC-WL,HP ONLY)
Iron: 48 ug/dL (ref 28–170)
Saturation Ratios: 15 % (ref 10.4–31.8)
TIBC: 329 ug/dL (ref 250–450)
UIBC: 281 ug/dL (ref 148–442)

## 2024-03-22 LAB — RETIC PANEL
Immature Retic Fract: 8.3 % (ref 2.3–15.9)
RBC.: 4.23 MIL/uL (ref 3.87–5.11)
Retic Count, Absolute: 46.5 K/uL (ref 19.0–186.0)
Retic Ct Pct: 1.1 % (ref 0.4–3.1)
Reticulocyte Hemoglobin: 36 pg (ref >27.9)

## 2024-03-22 LAB — FERRITIN: Ferritin: 65 ng/mL (ref 11–307)

## 2024-03-29 ENCOUNTER — Ambulatory Visit: Admitting: Hematology and Oncology

## 2024-04-05 ENCOUNTER — Other Ambulatory Visit: Payer: Self-pay | Admitting: Hematology and Oncology

## 2024-04-05 ENCOUNTER — Inpatient Hospital Stay: Admitting: Hematology and Oncology

## 2024-04-05 VITALS — BP 150/70 | HR 61 | Temp 97.3°F | Resp 13 | Wt 173.2 lb

## 2024-04-05 DIAGNOSIS — D500 Iron deficiency anemia secondary to blood loss (chronic): Secondary | ICD-10-CM

## 2024-04-05 DIAGNOSIS — Z7901 Long term (current) use of anticoagulants: Secondary | ICD-10-CM | POA: Diagnosis not present

## 2024-04-05 DIAGNOSIS — D6861 Antiphospholipid syndrome: Secondary | ICD-10-CM

## 2024-04-05 DIAGNOSIS — Z8673 Personal history of transient ischemic attack (TIA), and cerebral infarction without residual deficits: Secondary | ICD-10-CM | POA: Diagnosis not present

## 2024-04-05 DIAGNOSIS — D509 Iron deficiency anemia, unspecified: Secondary | ICD-10-CM | POA: Diagnosis not present

## 2024-04-05 DIAGNOSIS — F17290 Nicotine dependence, other tobacco product, uncomplicated: Secondary | ICD-10-CM | POA: Diagnosis not present

## 2024-04-05 NOTE — Progress Notes (Signed)
 [facility] [phone]   [phone]  PROGRESS NOTE  Patient Care Team: Daryl Setter, NP as PCP - General (Internal Medicine)  Hematological/Oncological History # Positive APS Antibodies in Setting of CVA 1) 06/20/2019: Left MCA stroke. Started on aspirin  therapy 81mg  daily.   2) 06/21/2019: Beta-2-glycoprotein IgG 65, Anticardiolipin IgG 66 3) 10/04/2019: as part of neurology workup, patient underwent APS testing. Found to have Anticardiolipin IgG elevated at 79, Beta 2 Glycoprotein IgG elevated at 88.  4) 11/14/2019: establish care with Dr. Federico    Interval History:  Phyllis Hopkins 48 y.o. female with medical history significant for antiphospholipid antibody syndrome with CVA presents for a follow up visit. The patient's last visit was on 09/28/2023. In the interim since the last visit the patient has been continued on systemic anticoagulation with coumadin  therapy.   On exam today Phyllis Hopkins notes she has been well overall in the interim since her last visit 6 months ago.  She reports that she has been feeling pretty good and her energy is about 8 out of 10.  She has been working at [employer]  She notes that she feels like she is improving her strength and stamina.  She is eating well and tries to eat spinach and red meat to increase her iron content.  She reports that she is taking her iron pills faithfully and they are working well with only some occasional constipation.  She is tolerating her Coumadin  well but reports she is overdue for a check.  She reports she is normally on the lower end of normal around 2.0.  She has had no issues with bleeding, bruising, or dark stools.  She has no overt signs of bleeding.  She is tolerating the Coumadin  well overall.  She reports nothing else out of the ordinary and she is in her usual state of health.  Her blood pressure is elevated today, but she notes she forgot to take her blood pressure medication  but will try to get back to a regular schedule.  Otherwise she feels well and has no questions concerns or complaints.  Full 10 point ROS otherwise negative.  MEDICAL HISTORY:  Past Medical History:  Diagnosis Date   Blood in stool    hx of   Cerebrovascular accident (CVA) (HCC) 06/20/2019   Heart murmur    Hypertension    Syncope     SURGICAL HISTORY: Past Surgical History:  Procedure Laterality Date   BUBBLE STUDY  06/25/2019   Procedure: BUBBLE STUDY;  Surgeon: Kate Lonni CROME, MD;  Location: [facility] ENDOSCOPY;  Service: Endoscopy;;   TEE WITHOUT CARDIOVERSION N/A 06/25/2019   Procedure: TRANSESOPHAGEAL ECHOCARDIOGRAM (TEE);  Surgeon: Kate Lonni CROME, MD;  Location: [facility] ENDOSCOPY;  Service: Endoscopy;  Laterality: N/A;    SOCIAL HISTORY: Social History   Socioeconomic History   Marital status: Divorced    Spouse name: Not on file   Number of children: Not on file   Years of education: Not on file   Highest education level: Not on file  Occupational History   Occupation: respiratory therapistory therapist    Comment: [employer]  Tobacco Use   Smoking status: Unknown   Smokeless tobacco: Never  Vaping Use   Vaping status: Some Days   Substances: Nicotine  Substance and Sexual Activity   Alcohol use: Yes    Comment: occ   Drug use: No   Sexual activity: Yes    Partners: Male    Birth control/protection:  Condom  Other Topics Concern   Not on file  Social History Narrative   Lives at home with dog and cat   Regular exercise; yes   Works as a Buyer, retail at the [employer]   Social Drivers of Corporate investment banker Strain: Not on [employer] Insecurity: Not on file  Transportation Needs: Not on file  Physical Activity: Not on file  Stress: Not on file  Social Connections: Not on file  Intimate Partner Violence: Not on file    FAMILY HISTORY: Family History  Problem Relation Age of Onset   Hypertension Mother    Hypertension Father     Diabetes Father        diet controlled   Pancreatic cancer Neg Hx    Colon cancer Neg Hx    Stomach cancer Neg Hx    Esophageal cancer Neg Hx    Rectal cancer Neg Hx     ALLERGIES:  has no known allergies.  MEDICATIONS:  Current Outpatient Medications  Medication Sig Dispense Refill   amLODipine  (NORVASC ) 10 MG tablet TAKE 1 TABLET(10 MG) BY MOUTH DAILY 90 tablet 0   aspirin  EC 81 MG EC tablet Take 1 tablet (81 mg total) by mouth daily.     atorvastatin  (LIPITOR) 40 MG tablet TAKE 1 TABLET(40 MG) BY MOUTH DAILY AT 6 PM 90 tablet 0   carvedilol  (COREG ) 25 MG tablet Take 1 tablet (25 mg total) by mouth 2 (two) times daily with a meal. 180 tablet 0   enoxaparin  (LOVENOX ) 80 MG/0.8ML injection Inject 0.8 mLs (80 mg total) into the skin every 12 (twelve) hours. As directed 20 mL 0   ferrous sulfate  325 (65 FE) MG EC tablet Take 1 tablet (325 mg total) by mouth daily with breakfast. 30 tablet 3   gabapentin  (NEURONTIN ) 100 MG capsule Take 1 capsule (100 mg total) by mouth 3 (three) times daily. 90 capsule 3   pantoprazole  (PROTONIX ) 40 MG tablet TAKE 1 TABLET BY MOUTH EVERY DAY 90 tablet 1   warfarin (COUMADIN ) 7.5 MG tablet Take 1/2 tablet (3.75 mg) on Tuesday and Thursday and 1 tablet (7.5 mg) on Monday,Wednesday, Friday, Saturday and Sunday. 90 tablet 1   No current facility-administered medications for this visit.    REVIEW OF SYSTEMS:   Constitutional: ( - ) fevers, ( - )  chills , ( - ) night sweats Eyes: ( - ) blurriness of vision, ( - ) double vision, ( - ) watery eyes Ears, nose, mouth, throat, and face: ( - ) mucositis, ( - ) sore throat Respiratory: ( - ) cough, ( - ) dyspnea, ( - ) wheezes Cardiovascular: ( - ) palpitation, ( - ) chest discomfort, ( - ) lower extremity swelling Gastrointestinal:  ( - ) nausea, ( - ) heartburn, ( - ) change in bowel habits Skin: ( - ) abnormal skin rashes Lymphatics: ( - ) new lymphadenopathy, ( - ) easy bruising Neurological: ( - ) numbness,  ( - ) tingling, ( - ) new weaknesses Behavioral/Psych: ( - ) mood change, ( - ) new changes  All other systems were reviewed with the patient and are negative.  PHYSICAL EXAMINATION: ECOG PERFORMANCE STATUS: 0 - Asymptomatic  Vitals:   04/05/24 0907  BP: (!) 150/70  Pulse: 61  Resp: 13  Temp: (!) 97.3 F (36.3 C)  SpO2: 100%      Filed Weights   04/05/24 0907  Weight: 173 lb 3.2 oz (78.6  kg)        GENERAL: well appearing middle aged [place] American female, alert, no distress and comfortable SKIN: skin color, texture, turgor are normal, no rashes or significant lesions EYES: conjunctiva are pink and non-injected, sclera clear LUNGS: clear to auscultation and percussion with normal breathing effort HEART: regular rate & rhythm and no murmurs and no lower extremity edema Musculoskeletal: no cyanosis of digits and no clubbing  PSYCH: alert & oriented x 3, fluent speech NEURO: no focal motor/sensory deficits  LABORATORY DATA:  I have reviewed the data as listed    Latest Ref Rng & Units 03/22/2024   11:12 AM 09/21/2023   12:10 PM 03/28/2023    2:57 PM  CBC  WBC 4.0 - 10.5 K/uL 4.1  3.7  5.0   Hemoglobin 12.0 - 15.0 g/dL 87.3  88.2  87.6   Hematocrit 36.0 - 46.0 % 37.3  35.9  38.0   Platelets 150 - 400 K/uL 551  574  595        Latest Ref Rng & Units 03/22/2024   11:12 AM 09/21/2023   12:10 PM 03/28/2023    2:57 PM  CMP  Glucose 70 - 99 mg/dL 93  881  82   BUN 6 - 20 mg/dL 14  13  14    Creatinine 0.44 - 1.00 mg/dL 9.04  8.98  9.04   Sodium 135 - 145 mmol/L 136  138  139   Potassium 3.5 - 5.1 mmol/L 4.1  3.9  3.8   Chloride 98 - 111 mmol/L 105  104  108   CO2 22 - 32 mmol/L 26  28  23    Calcium  8.9 - 10.3 mg/dL 9.4  9.2  9.5   Total Protein 6.5 - 8.1 g/dL 7.9  7.7  8.0   Total Bilirubin 0.0 - 1.2 mg/dL 0.5  0.6  0.5   Alkaline Phos 38 - 126 U/L 45  53  49   AST 15 - 41 U/L 12  12  12    ALT 0 - 44 U/L 8  9  8      RADIOGRAPHIC STUDIES: No results  found.  ASSESSMENT & PLAN Phyllis Hopkins 48 y.o. female with medical history significant for antiphospholipid antibody syndrome with CVA presents for a follow up visit.  Her review of the labs, discussion with the patient her findings are most consistent with antiphospholipid antibody syndrome as well as an unrelated iron deficiency anemia secondary to GYN blood loss.    She has continued on coumadin  therapy and has tolerated it well.  Additionally her hemoglobin has improved from prior.  Her serum iron levels do appear to be improving at her last visit.  Our plan will be to have the patient continue to follow with the Coumadin  clinic as well as return to our clinic in 6 months time in order to assure that she has stable iron levels.  # Positive APS Antibodies in Setting of CVA # Antiphospholipid Antibody Syndrome --Patient currently meets clinical/laboratory criteria and the diagnosis of APS was confirmed with repeat testing of antibodies in 12 weeks time. --after CVA with APS it is recommended that patients be on lifelong therapy with coumadin . DOACs are inferior to coumadin  in the setting of APS --continue with coumadin  clinic through her primary care. Unfortunately we do no offer coumadin  clinic services at the [facility]. She currently follows with another coumadin  clinic.  --RTC in 6 months to assure patient is stable on  coumadin  and to assess her iron deficiency anemia.    #Iron Deficiency Anemia 2/2 to Blood Loss --previously reportedheavy menstrual cycles as cause of blood loss/iron deficiency --repeat iron panel for the patient today --Labs today show white blood cell count 4.1, Hgb 12.6, MCV 88, Plt 551. Ferritin 65  --continue ferrous sulfate  325mg  PO daily with a source of vitamin C --if unable to maintain Hgb with PO therapy will consider IV iron therapy instead.  IV iron was offered today but the patient declined.  She would like to continue p.o.  therapy. --continue to monitor  No orders of the defined types were placed in this encounter.   All questions were answered. The patient knows to call the clinic with any problems, questions or concerns.  A total of more than 30 minutes were spent on this encounter and over half of that time was spent on counseling and coordination of care as outlined above.   Phyllis Hopkins Kidney, MD Department of Hematology/Oncology [facility] Phone: [phone] Pager: [phone] Email: [email or web address]  04/05/2024 9:15 AM

## 2024-06-25 ENCOUNTER — Ambulatory Visit
Admission: EM | Admit: 2024-06-25 | Discharge: 2024-06-25 | Disposition: A | Discharge status: Home / Self Care | Attending: Family Medicine | Admitting: Family Medicine

## 2024-06-25 ENCOUNTER — Encounter: Payer: Self-pay | Admitting: Emergency Medicine

## 2024-06-25 DIAGNOSIS — J329 Chronic sinusitis, unspecified: Secondary | ICD-10-CM

## 2024-06-25 DIAGNOSIS — J40 Bronchitis, not specified as acute or chronic: Secondary | ICD-10-CM | POA: Diagnosis not present

## 2024-06-25 MED ORDER — BENZONATATE 200 MG PO CAPS: 200.0000 mg | ORAL_CAPSULE | Freq: Three times a day (TID) | ORAL | 0 refills | Status: AC | PRN

## 2024-06-25 MED ORDER — AMOXICILLIN-POT CLAVULANATE 875-125 MG PO TABS: 1.0000 | ORAL_TABLET | Freq: Two times a day (BID) | ORAL | 0 refills | Status: AC

## 2024-06-25 MED ORDER — FLUTICASONE PROPIONATE 50 MCG/ACT NA SUSP: 1.0000 | Freq: Every day | NASAL | 0 refills | Status: AC

## 2024-06-25 NOTE — ED Triage Notes (Addendum)
 Pt st's she had a sore throat 1 week ago but it has now subsided  St's she also has had a productive cough, headache, runny nose and sneezing   Pt is also hypertensive and has not had her HTN meds since Sat.

## 2024-06-25 NOTE — Discharge Instructions (Signed)
 Please take your blood pressure medication as soon as you get home and monitor your blood pressure closely.  If it does not improve and/or you develop chest pain, shortness of breath, blurry vision, dizziness or headache please go to the emergency room ASAP for further treatment.  Start Augmentin  antibiotic twice daily for 7 days.  Please let whoever monitors her INR know you are on an antibiotic as this may affect your INR readings and may need to be checked more frequently while you are on this.  Monitor for any signs of bleeding such as bleeding from the gums or blood in your urine.  Flonase  nasal spray daily to help with your sinus pressure and you may take Tessalon  3 times a day as needed for your cough.  Lots of rest and fluids.  Please follow-up with your PCP in 2 to 3 days for recheck.  Please go to the ER for any worsening symptoms.  Hope you feel better soon!

## 2024-06-25 NOTE — ED Provider Notes (Signed)
 " [facility]    CSN: [id number] Arrival date & time: 06/25/24  0850      History   Chief Complaint Chief Complaint  Patient presents with   Cough    HPI Phyllis Hopkins is a 49 y.o. female  presents for evaluation of URI symptoms for 7 days. Patient reports associated symptoms of cough, congestion, sinus pressure/pain with subjective fevers, sinus headache, sneezing. Denies N/V/D, sore throat, ear pain, body aches. Patient does not have a hx of asthma. Patient does vape tobacco.  Reports no known sick contacts.  Pt has taken Mucinex OTC for symptoms.  Patient BP noted to be elevated on intake.  She takes Coreg  and amlodipine  but has not taken this in 2 days as she states she is not very consistent with taking her medications.  She denies chest pain, shortness of breath, dizziness, visual changes or headache.  Pt has no other concerns at this time.    Cough Associated symptoms: fever     Past Medical History:  Diagnosis Date   Blood in stool    hx of   Cerebrovascular accident (CVA) (HCC) 06/20/2019   Heart murmur    Hypertension    Syncope     Patient Active Problem List   Diagnosis Date Noted   Lumbar radiculopathy 07/19/2023   Preventative health care 05/18/2023   Iron deficiency anemia 05/18/2023   Heme positive stool 03/18/2023   Hypokalemia 09/27/2022   Antiphospholipid syndrome 09/27/2022   Anterior shoulder dislocation, right 06/25/2019   Hyperlipidemia 06/22/2019   Cerebrovascular accident (CVA) (HCC) 06/20/2019   Left arm pain 05/19/2017   HTN (hypertension) 09/30/2010   COLITIS, HX OF 08/28/2010    Past Surgical History:  Procedure Laterality Date   BUBBLE STUDY  06/25/2019   Procedure: BUBBLE STUDY;  Surgeon: Kate Lonni CROME, MD;  Location: [facility] ENDOSCOPY;  Service: Endoscopy;;   TEE WITHOUT CARDIOVERSION N/A 06/25/2019   Procedure: TRANSESOPHAGEAL ECHOCARDIOGRAM (TEE);  Surgeon: Kate Lonni CROME, MD;  Location: [facility] ENDOSCOPY;   Service: Endoscopy;  Laterality: N/A;    OB History     Gravida  1   Para  0   Term  0   Preterm  0   AB  1   Living  0      SAB  0   IAB  0   Ectopic  1   Multiple  0   Live Births  0            Home Medications    Prior to Admission medications  Medication Sig Start Date End Date Taking? Authorizing Provider  amoxicillin -clavulanate (AUGMENTIN ) 875-125 MG tablet Take 1 tablet by mouth every 12 (twelve) hours. 06/25/24  Yes Catarina Huntley, Jodi R, NP  benzonatate  (TESSALON ) 200 MG capsule Take 1 capsule (200 mg total) by mouth 3 (three) times daily as needed. 06/25/24  Yes Morgen Ritacco, Jodi R, NP  fluticasone  (FLONASE ) 50 MCG/ACT nasal spray Place 1 spray into both nostrils daily. 06/25/24  Yes Ariba Lehnen, Jodi R, NP  amLODipine  (NORVASC ) 10 MG tablet TAKE 1 TABLET(10 MG) BY MOUTH DAILY 01/23/24   Daryl Setter, NP  aspirin  EC 81 MG EC tablet Take 1 tablet (81 mg total) by mouth daily. 06/26/19   Noemi Reena CROME, NP  atorvastatin  (LIPITOR) 40 MG tablet TAKE 1 TABLET(40 MG) BY MOUTH DAILY AT 6 PM 01/23/24   O'Sullivan, Melissa, NP  carvedilol  (COREG ) 25 MG tablet Take 1 tablet (25 mg total) by mouth 2 (two) times  daily with a meal. 01/27/24   Daryl Setter, NP  enoxaparin  (LOVENOX ) 80 MG/0.8ML injection Inject 0.8 mLs (80 mg total) into the skin every 12 (twelve) hours. As directed Patient not taking: Reported on 06/25/2024 06/30/23   Daryl Setter, NP  ferrous sulfate  325 (65 FE) MG EC tablet Take 1 tablet (325 mg total) by mouth daily with breakfast. 03/26/20   Daryl Setter, NP  gabapentin  (NEURONTIN ) 100 MG capsule Take 1 capsule (100 mg total) by mouth 3 (three) times daily. Patient not taking: Reported on 06/25/2024 07/18/23   O'Sullivan, Melissa, NP  pantoprazole  (PROTONIX ) 40 MG tablet TAKE 1 TABLET BY MOUTH EVERY DAY Patient not taking: Reported on 06/25/2024 07/13/23   Federico Rosario BROCKS, MD  warfarin (COUMADIN ) 7.5 MG tablet Take 1/2 tablet (3.75 mg) on Tuesday and Thursday and  1 tablet (7.5 mg) on Monday,Wednesday, Friday, Saturday and Sunday. 11/18/23   Daryl Setter, NP    Family History Family History  Problem Relation Age of Onset   Hypertension Mother    Hypertension Father    Diabetes Father        diet controlled   Pancreatic cancer Neg Hx    Colon cancer Neg Hx    Stomach cancer Neg Hx    Esophageal cancer Neg Hx    Rectal cancer Neg Hx     Social History Social History[1]   Allergies   Patient has no known allergies.   Review of Systems Review of Systems  Constitutional:  Positive for fever.  HENT:  Positive for congestion, sinus pressure, sinus pain and sneezing.   Respiratory:  Positive for cough.      Physical Exam Triage Vital Signs ED Triage Vitals  Encounter Vitals Group     BP 06/25/24 0921 (!) 184/123     Girls Systolic BP Percentile --      Girls Diastolic BP Percentile --      Boys Systolic BP Percentile --      Boys Diastolic BP Percentile --      Pulse Rate 06/25/24 0921 66     Resp 06/25/24 0921 15     Temp 06/25/24 0921 98.5 F (36.9 C)     Temp Source 06/25/24 0921 Oral     SpO2 06/25/24 0921 98 %     Weight 06/25/24 0925 165 lb (74.8 kg)     Height 06/25/24 0925 5' 2 (1.575 m)     Head Circumference --      Peak Flow --      Pain Score 06/25/24 0925 0     Pain Loc --      Pain Education --      Exclude from Growth Chart --    No data found.  Updated Vital Signs BP (!) 163/103 (BP Location: Right Arm)   Pulse 66   Temp 98.5 F (36.9 C) (Oral)   Resp 15   Ht 5' 2 (1.575 m)   Wt 165 lb (74.8 kg)   LMP 06/23/2024 (Exact Date)   SpO2 98%   BMI 30.18 kg/m   Visual Acuity Right Eye Distance:   Left Eye Distance:   Bilateral Distance:    Right Eye Near:   Left Eye Near:    Bilateral Near:     Physical Exam Vitals and nursing note reviewed.  Constitutional:      General: She is not in acute distress.    Appearance: She is well-developed. She is not ill-appearing.  HENT:     Head:  Normocephalic and atraumatic.     Right Ear: Tympanic membrane and ear canal normal.     Left Ear: Tympanic membrane and ear canal normal.     Nose: Congestion present.     Right Turbinates: Not swollen or pale.     Left Turbinates: Pale.     Right Sinus: No maxillary sinus tenderness or frontal sinus tenderness.     Left Sinus: Maxillary sinus tenderness present. No frontal sinus tenderness.     Mouth/Throat:     Mouth: Mucous membranes are moist.     Pharynx: Oropharynx is clear. Uvula midline. No oropharyngeal exudate or posterior oropharyngeal erythema.     Tonsils: No tonsillar exudate or tonsillar abscesses.  Eyes:     Conjunctiva/sclera: Conjunctivae normal.     Pupils: Pupils are equal, round, and reactive to light.  Cardiovascular:     Rate and Rhythm: Normal rate and regular rhythm.     Heart sounds: Normal heart sounds.  Pulmonary:     Effort: Pulmonary effort is normal.     Breath sounds: Normal breath sounds. No wheezing, rhonchi or rales.  Musculoskeletal:     Cervical back: Normal range of motion and neck supple.  Lymphadenopathy:     Cervical: No cervical adenopathy.  Skin:    General: Skin is warm and dry.  Neurological:     General: No focal deficit present.     Mental Status: She is alert and oriented to person, place, and time.  Psychiatric:        Mood and Affect: Mood normal.        Behavior: Behavior normal.      UC Treatments / Results  Labs (all labs ordered are listed, but only abnormal results are displayed) Labs Reviewed - No data to display CMP [facility] only) Order: [id number]  Status: Final result     Next appt: 10/03/2024 at 11:00 AM in Oncology ([facility])     Dx: Iron deficiency anemia due to chronic...   Test Result Released: Yes (seen)   0 Result Notes          Component Ref Range & Units (hover) 3 mo ago (03/22/24) 9 mo ago (09/21/23) 1 yr ago (03/28/23) 1 yr ago (01/26/23) 1 yr ago (10/04/22) 1 yr ago (09/13/22) 2 yr  ago (02/23/22)  Sodium 136 138 139 136 R 139 R 139 139  Potassium 4.1 3.9 3.8 4.4 R 3.8 R 3.3 Low  3.6  Chloride 105 104 108 103 R 106 R 104 106  CO2 26 28 23 25  R 24 R 29 27  Glucose, Bld 93 118 High  CM 82 CM 83 102 High  121 High  CM 105 High  CM  Comment: Glucose reference range applies only to samples taken after fasting for at least 8 hours.  BUN 14 13 14 15  R 12 R 14 14  Creatinine 0.95 1.01 High  0.95 0.95 R 0.88 R 1.02 High  0.93  Calcium  9.4 9.2 9.5 9.4 R 9.1 R 9.7 9.6  Total Protein 7.9 7.7 8.0  7.2 R 7.5 8.0  Albumin 4.1 4.2 4.4  4.1 R 4.3 4.5  AST 12 Low  12 Low  12 Low   12 R 13 Low  16  ALT 8 9 8  9  R 9 11  Alkaline Phosphatase 45 53 49  48 R 50 49  Total Bilirubin 0.5 0.6 0.5 R  0.3 R 0.7 R 0.6 R  GFR, Estimated >60 >60 CM >  60 CM   >60 CM >60 CM  Comment: (NOTE) Calculated using the CKD-EPI Creatinine Equation (2021)  Anion gap 5 6 CM 8 CM   6 CM 6 CM  Comment: Performed at [facility], [address], [place], [place] [address]  Resulting Agency [facility] LAB [facility] CL[facility] LAB [facility] [place] [facility] [place] [facility]        Specimen Collected: 03/22/24 11:12 Last Resulted: 03/22/24 11:46   EKG   Radiology No results found.  Procedures Procedures (including critical care time)  Medications Ordered in UC Medications - No data to display  Initial Impression / Assessment and Plan / UC Course  I have reviewed the triage vital signs and the nursing notes.  Pertinent labs & imaging results that were available during my care of the patient were reviewed by me and considered in my medical decision making (see chart for details).     Reviewed exam and symptoms with patient.  Discussed BP with patient.  It did slightly improve on recheck.  She will take her blood pressure medication as soon as she gets home and monitor this closely.  I did advise if it does not improve or she develops any red flag symptoms she is to go  to the ER.  Will do Augmentin  for sinusitis, Flonase  and Tessalon  as needed.  Discussed with patient as she is on Coumadin  to let whoever monitors INR and if she is on antibiotic as they may need to check her INR more frequently while on this.  Signs and symptoms of bleeding reviewed.  Advise rest fluids and PCP follow-up 2 to 3 days for recheck.  ER precautions reviewed. Final Clinical Impressions(s) / UC Diagnoses   Final diagnoses:  Sinobronchitis     Discharge Instructions      Please take your blood pressure medication as soon as you get home and monitor your blood pressure closely.  If it does not improve and/or you develop chest pain, shortness of breath, blurry vision, dizziness or headache please go to the emergency room ASAP for further treatment.  Start Augmentin  antibiotic twice daily for 7 days.  Please let whoever monitors her INR know you are on an antibiotic as this may affect your INR readings and may need to be checked more frequently while you are on this.  Monitor for any signs of bleeding such as bleeding from the gums or blood in your urine.  Flonase  nasal spray daily to help with your sinus pressure and you may take Tessalon  3 times a day as needed for your cough.  Lots of rest and fluids.  Please follow-up with your PCP in 2 to 3 days for recheck.  Please go to the ER for any worsening symptoms.  Hope you feel better soon!    ED Prescriptions     Medication Sig Dispense Auth. Provider   amoxicillin -clavulanate (AUGMENTIN ) 875-125 MG tablet Take 1 tablet by mouth every 12 (twelve) hours. 14 tablet Camber Ninh, Jodi R, NP   fluticasone  (FLONASE ) 50 MCG/ACT nasal spray Place 1 spray into both nostrils daily. 15.8 mL Ethanael Veith, Jodi R, NP   benzonatate  (TESSALON ) 200 MG capsule Take 1 capsule (200 mg total) by mouth 3 (three) times daily as needed. 20 capsule Elexius Minar, Jodi R, NP      PDMP not reviewed this encounter.     [1]  Social History Tobacco Use   Smoking status: Unknown    Smokeless tobacco:  Never  Vaping Use   Vaping status: Some Days   Substances: Nicotine  Substance Use Topics   Alcohol use: Yes    Comment: occ   Drug use: No     Loreda Myla SAUNDERS, NP 06/25/24 1002  "

## 2024-10-03 ENCOUNTER — Inpatient Hospital Stay: Admitting: Physician Assistant

## 2024-10-03 ENCOUNTER — Inpatient Hospital Stay
